# Patient Record
Sex: Female | Born: 1935
Health system: Southern US, Community
[De-identification: ages and names within clinical notes are randomized; demographics above are authoritative.]

## PROBLEM LIST (undated history)

## (undated) DIAGNOSIS — R531 Weakness: Secondary | ICD-10-CM

## (undated) DIAGNOSIS — I10 Essential (primary) hypertension: Secondary | ICD-10-CM

## (undated) DIAGNOSIS — Z923 Personal history of irradiation: Secondary | ICD-10-CM

## (undated) DIAGNOSIS — E079 Disorder of thyroid, unspecified: Secondary | ICD-10-CM

## (undated) DIAGNOSIS — C55 Malignant neoplasm of uterus, part unspecified: Secondary | ICD-10-CM

## (undated) DIAGNOSIS — K5909 Other constipation: Secondary | ICD-10-CM

## (undated) HISTORY — PX: CORONARY ANGIOPLASTY: SHX604

## (undated) HISTORY — DX: Personal history of irradiation: Z92.3

---
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
## 1998-01-21 ENCOUNTER — Emergency Department (HOSPITAL_COMMUNITY): Admission: EM | Admit: 1998-01-21 | Discharge: 1998-01-21 | Payer: Self-pay | Admitting: Emergency Medicine

## 1998-01-25 ENCOUNTER — Encounter: Admission: RE | Admit: 1998-01-25 | Discharge: 1998-01-25 | Payer: Self-pay | Admitting: Internal Medicine

## 1998-02-01 ENCOUNTER — Encounter: Admission: RE | Admit: 1998-02-01 | Discharge: 1998-02-01 | Payer: Self-pay | Admitting: Internal Medicine

## 1998-03-14 ENCOUNTER — Ambulatory Visit (HOSPITAL_COMMUNITY): Admission: RE | Admit: 1998-03-14 | Discharge: 1998-03-14 | Payer: Self-pay | Admitting: Cardiology

## 1998-03-21 ENCOUNTER — Ambulatory Visit (HOSPITAL_COMMUNITY): Admission: RE | Admit: 1998-03-21 | Discharge: 1998-03-22 | Payer: Self-pay | Admitting: Cardiology

## 1998-05-08 ENCOUNTER — Ambulatory Visit (HOSPITAL_COMMUNITY): Admission: RE | Admit: 1998-05-08 | Discharge: 1998-05-08 | Payer: Self-pay | Admitting: Cardiology

## 1998-05-13 ENCOUNTER — Ambulatory Visit (HOSPITAL_COMMUNITY): Admission: RE | Admit: 1998-05-13 | Discharge: 1998-05-13 | Payer: Self-pay | Admitting: Cardiology

## 1998-05-16 ENCOUNTER — Ambulatory Visit (HOSPITAL_COMMUNITY): Admission: RE | Admit: 1998-05-16 | Discharge: 1998-05-16 | Payer: Self-pay | Admitting: Cardiology

## 1999-05-20 DIAGNOSIS — R531 Weakness: Secondary | ICD-10-CM

## 1999-05-20 HISTORY — DX: Weakness: R53.1

## 2000-08-22 ENCOUNTER — Emergency Department (HOSPITAL_COMMUNITY): Admission: EM | Admit: 2000-08-22 | Discharge: 2000-08-22 | Payer: Self-pay | Admitting: Emergency Medicine

## 2001-04-25 ENCOUNTER — Ambulatory Visit (HOSPITAL_COMMUNITY): Admission: RE | Admit: 2001-04-25 | Discharge: 2001-04-25 | Payer: Self-pay | Admitting: Cardiology

## 2001-04-25 ENCOUNTER — Encounter: Payer: Self-pay | Admitting: Cardiology

## 2002-09-29 ENCOUNTER — Ambulatory Visit (HOSPITAL_COMMUNITY): Admission: RE | Admit: 2002-09-29 | Discharge: 2002-09-29 | Payer: Self-pay | Admitting: Cardiology

## 2002-09-29 ENCOUNTER — Encounter: Payer: Self-pay | Admitting: Cardiology

## 2005-11-26 ENCOUNTER — Emergency Department (HOSPITAL_COMMUNITY): Admission: EM | Admit: 2005-11-26 | Discharge: 2005-11-26 | Payer: Self-pay | Admitting: *Deleted

## 2007-04-04 ENCOUNTER — Encounter: Admission: RE | Admit: 2007-04-04 | Discharge: 2007-04-04 | Payer: Self-pay | Admitting: Cardiology

## 2007-05-22 ENCOUNTER — Emergency Department (HOSPITAL_COMMUNITY): Admission: EM | Admit: 2007-05-22 | Discharge: 2007-05-22 | Payer: Self-pay | Admitting: Emergency Medicine

## 2008-02-17 ENCOUNTER — Emergency Department (HOSPITAL_COMMUNITY): Admission: EM | Admit: 2008-02-17 | Discharge: 2008-02-17 | Payer: Self-pay | Admitting: Emergency Medicine

## 2012-06-23 ENCOUNTER — Other Ambulatory Visit: Payer: Self-pay | Admitting: Internal Medicine

## 2012-06-23 DIAGNOSIS — R609 Edema, unspecified: Secondary | ICD-10-CM

## 2012-06-23 DIAGNOSIS — R52 Pain, unspecified: Secondary | ICD-10-CM

## 2012-06-23 DIAGNOSIS — F29 Unspecified psychosis not due to a substance or known physiological condition: Secondary | ICD-10-CM

## 2012-06-27 ENCOUNTER — Ambulatory Visit
Admission: RE | Admit: 2012-06-27 | Discharge: 2012-06-27 | Disposition: A | Discharge status: Home / Self Care | Payer: Medicare Other | Source: Ambulatory Visit | Attending: Internal Medicine | Admitting: Internal Medicine

## 2012-06-27 DIAGNOSIS — R52 Pain, unspecified: Secondary | ICD-10-CM

## 2012-06-27 DIAGNOSIS — F29 Unspecified psychosis not due to a substance or known physiological condition: Secondary | ICD-10-CM

## 2012-06-27 DIAGNOSIS — R609 Edema, unspecified: Secondary | ICD-10-CM

## 2012-07-12 ENCOUNTER — Emergency Department (HOSPITAL_COMMUNITY): Payer: Medicare Other

## 2012-07-12 ENCOUNTER — Emergency Department (INDEPENDENT_AMBULATORY_CARE_PROVIDER_SITE_OTHER)
Admission: EM | Admit: 2012-07-12 | Discharge: 2012-07-12 | Disposition: A | Discharge status: Nursing Home (Includes ICF) | Payer: Medicare Other | Source: Home / Self Care | Attending: Family Medicine | Admitting: Family Medicine

## 2012-07-12 ENCOUNTER — Emergency Department (HOSPITAL_COMMUNITY)
Admission: EM | Admit: 2012-07-12 | Discharge: 2012-07-12 | Disposition: A | Discharge status: Home / Self Care | Payer: Medicare Other | Attending: Emergency Medicine | Admitting: Emergency Medicine

## 2012-07-12 ENCOUNTER — Encounter (HOSPITAL_COMMUNITY): Payer: Self-pay | Admitting: Emergency Medicine

## 2012-07-12 DIAGNOSIS — I440 Atrioventricular block, first degree: Secondary | ICD-10-CM | POA: Insufficient documentation

## 2012-07-12 DIAGNOSIS — R269 Unspecified abnormalities of gait and mobility: Secondary | ICD-10-CM

## 2012-07-12 DIAGNOSIS — R42 Dizziness and giddiness: Secondary | ICD-10-CM

## 2012-07-12 DIAGNOSIS — I4510 Unspecified right bundle-branch block: Secondary | ICD-10-CM | POA: Insufficient documentation

## 2012-07-12 DIAGNOSIS — R2681 Unsteadiness on feet: Secondary | ICD-10-CM

## 2012-07-12 DIAGNOSIS — I10 Essential (primary) hypertension: Secondary | ICD-10-CM | POA: Insufficient documentation

## 2012-07-12 DIAGNOSIS — N390 Urinary tract infection, site not specified: Secondary | ICD-10-CM

## 2012-07-12 HISTORY — DX: Disorder of thyroid, unspecified: E07.9

## 2012-07-12 HISTORY — DX: Essential (primary) hypertension: I10

## 2012-07-12 LAB — COMPREHENSIVE METABOLIC PANEL
ALT: 16 U/L (ref 0–35)
AST: 29 U/L (ref 0–37)
Albumin: 3.9 g/dL (ref 3.5–5.2)
Calcium: 9.9 mg/dL (ref 8.4–10.5)
Chloride: 101 mEq/L (ref 96–112)
Creatinine, Ser: 0.75 mg/dL (ref 0.50–1.10)
Sodium: 141 mEq/L (ref 135–145)

## 2012-07-12 LAB — CBC WITH DIFFERENTIAL/PLATELET
Basophils Absolute: 0 10*3/uL (ref 0.0–0.1)
Basophils Relative: 0 % (ref 0–1)
Eosinophils Relative: 1 % (ref 0–5)
HCT: 37.9 % (ref 36.0–46.0)
Lymphocytes Relative: 19 % (ref 12–46)
MCHC: 34 g/dL (ref 30.0–36.0)
Monocytes Absolute: 0.4 10*3/uL (ref 0.1–1.0)
Neutro Abs: 3.4 10*3/uL (ref 1.7–7.7)
Platelets: 237 10*3/uL (ref 150–400)
RDW: 15 % (ref 11.5–15.5)
WBC: 4.8 10*3/uL (ref 4.0–10.5)

## 2012-07-12 LAB — URINALYSIS, ROUTINE W REFLEX MICROSCOPIC
Glucose, UA: NEGATIVE mg/dL
Ketones, ur: NEGATIVE mg/dL
Protein, ur: NEGATIVE mg/dL
Urobilinogen, UA: 0.2 mg/dL (ref 0.0–1.0)

## 2012-07-12 LAB — URINE MICROSCOPIC-ADD ON

## 2012-07-12 LAB — TROPONIN I
Troponin I: <0.3 ng/mL (ref <0.30)
Troponin I: <0.3 ng/mL (ref <0.30)

## 2012-07-12 MED ORDER — SODIUM CHLORIDE 0.9 % IV SOLN
Freq: Once | INTRAVENOUS | Status: AC
  Administered 2012-07-12: 14:00:00 via INTRAVENOUS

## 2012-07-12 MED ORDER — SULFAMETHOXAZOLE-TRIMETHOPRIM 800-160 MG PO TABS: 1.0000 | ORAL_TABLET | Freq: Two times a day (BID) | ORAL | Status: AC

## 2012-07-12 NOTE — ED Notes (Signed)
[facility] called for transfer 

## 2012-07-12 NOTE — ED Provider Notes (Signed)
History     CSN: [id number]  Arrival date & time 07/12/12  1453   First MD Initiated Contact with Patient 07/12/12 1500      Chief Complaint  Patient presents with  . Dizziness     HPI  The patient presents with concerns of increasing disequilibrium, and after a near fall. The patient has a history of vertigo, that began several months ago.  The patient and her daughter note that over the past days this has become worse.  The patient denies any specific falls, states that she nearly fell just prior to arrival today.  She notes that there are no clear precipitating, exacerbating, alleviating circumstances.  She was prescribed meclizine, though she does not take this medicine regularly. She denies any ongoing chest pain,, dyspnea, lightheadedness, nausea, dysuria, abdominal pain, vomiting.  Past Medical History  Diagnosis Date  . Thyroid disease   . Hypertension     Past Surgical History  Procedure Date  . Coronary angioplasty     No family history on file.  History  Substance Use Topics  . Smoking status: Never Smoker   . Smokeless tobacco: Not on file  . Alcohol Use: No    OB History    Grav Para Term Preterm Abortions TAB SAB Ect Mult Living                  Review of Systems  Constitutional:       HPI  HENT:       HPI otherwise negative  Eyes: Negative.   Respiratory:       HPI, otherwise negative  Cardiovascular:       HPI, otherwise nmegative  Gastrointestinal: Negative for vomiting.  Genitourinary:       HPI, otherwise negative  Musculoskeletal:       HPI, otherwise negative  Skin: Negative.   Neurological: Positive for dizziness. Negative for tremors, seizures, syncope, facial asymmetry, speech difficulty, weakness, light-headedness, numbness and headaches.    Allergies  Review of patient's allergies indicates no known allergies.  Home Medications   Current Outpatient Rx  Name Route Sig Dispense Refill  . B COMPLEX PO TABS Oral Take 1  tablet by mouth daily.    Marland Kitchen DIAZEPAM 2 MG PO TABS Oral Take 2 mg by mouth at bedtime as needed. For insomnia.    Marland Kitchen LEVOTHYROXINE SODIUM 112 MCG PO TABS Oral Take 112 mcg by mouth daily.    Marland Kitchen LOSARTAN POTASSIUM-HCTZ 100-25 MG PO TABS Oral Take 1 tablet by mouth daily.    Marland Kitchen MECLIZINE HCL 25 MG PO TABS Oral Take 25 mg by mouth 3 (three) times daily as needed. For vertigo.    Marland Kitchen ONE-DAILY MULTI VITAMINS PO TABS Oral Take 1 tablet by mouth daily.    . OMEGA-3-ACID ETHYL ESTERS 1 G PO CAPS Oral Take 2 g by mouth daily.    Marland Kitchen OVER THE COUNTER MEDICATION Oral Take 1 tablet by mouth daily as needed. All-natural laxative.  For constipation.    Marland Kitchen PROBIOTIC PO Oral Take 1 tablet by mouth daily. [employer] brand generic.      BP 174/75  Temp 97.8 F (36.6 C) (Oral)  Resp 12  SpO2 100%  Physical Exam  Nursing note and vitals reviewed. Constitutional: She is oriented to person, place, and time. She appears well-developed and well-nourished. No distress.       Elderly female resting comfortably in bed  HENT:  Head: Normocephalic and atraumatic.  Eyes: Conjunctivae normal and  EOM are normal.  Cardiovascular: Normal rate and regular rhythm.   Murmur heard. Pulmonary/Chest: Effort normal and breath sounds normal. No stridor. No respiratory distress.  Abdominal: She exhibits no distension.  Musculoskeletal: She exhibits no edema.  Neurological: She is alert and oriented to person, place, and time. She displays no atrophy and no tremor. No cranial nerve deficit or sensory deficit. She exhibits normal muscle tone. She displays no seizure activity. Coordination normal.       Is no gross cerebellar deficits.  Strength is appropriate and symmetric throughout.  No nystagmus   Skin: Skin is warm and dry.  Psychiatric: She has a normal mood and affect.    ED Course  Procedures (including critical care time)   Labs Reviewed  URINALYSIS, ROUTINE W REFLEX MICROSCOPIC  CBC WITH DIFFERENTIAL  COMPREHENSIVE  METABOLIC PANEL  LACTIC ACID, PLASMA  TROPONIN I  TROPONIN I  TROPONIN I   No results found.   No diagnosis found.  Cardiac 60 sinus rhythm normal Pulse ox 100% room air normal   Date: 07/12/2012  Rate: 62  Rhythm: normal sinus rhythm  QRS Axis: left  Intervals: PR prolonged  ST/T Wave abnormalities: nonspecific ST/T changes  Conduction Disutrbances:first-degree A-V block RBBB  Narrative Interpretation:   Old EKG Reviewed: none available ABNORMAL  I obtain the patient's MRI results from earlier this month.  No acute findings at the time.  There are multiple age associated changes. MDM  This pleasant elderly female presents with concerns of ongoing disequilibrium and dizziness.  Notably the patient previously diagnosed with disequilibrium, likely vertigo and prescribed meclizine which is not taking reliably.  On my exam the patient is in no distress with no focal neurologic deficiencies.  Given the patient's history there suspicion of ongoing subacute process.  I obtained outside records of recent MRI, these were largely reassuring.  The patient's labs are notable for largely unremarkable results, but there is evidence of a urinary tract infection.  Urine culture was sent and this was started on antibiotics.  She was discharged in stable condition with next a followup.  She was also advised on the requirement for appropriate medication compliance.   Gerhard Munch, MD 07/12/12 2240
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
## 2012-07-12 NOTE — ED Notes (Signed)
[facility] transfer via [facility], pt states she was dizzy at home this AM, denies dizzy on arrival, is A/O, equal grips, clear speech, no CP/SOB, denies all pain, NAD

## 2012-07-12 NOTE — ED Notes (Signed)
Pt's family st's they're ready to go, st's they have to get home to care for family.  Will notify EDP [place].

## 2012-07-12 NOTE — ED Notes (Signed)
Triage delay to nursing staff in a procedure

## 2012-07-12 NOTE — ED Notes (Signed)
Pt assessed in waiting room - appears in no acute distress pt a & o x 3 - states " i know what it is - i don't drink enough fluids and that makes my dizzy spells worse." denies syncope.

## 2012-07-12 NOTE — ED Provider Notes (Signed)
History     CSN: [id number]  Arrival date & time 07/12/12  1228   First MD Initiated Contact with Patient 07/12/12 1300      Chief Complaint  Patient presents with  . Dizziness    (Consider location/radiation/quality/duration/timing/severity/associated sxs/prior treatment) Patient is a 78 y.o. female presenting with fall. The history is provided by the patient and a relative.  Fall The accident occurred 3 to 5 hours ago (unsteadiness while in kitchen, signif different from recent problem with dizziness, feeling off balance and weak.). Pertinent negatives include no numbness and no headaches.    Past Medical History  Diagnosis Date  . Thyroid disease   . Hypertension     Past Surgical History  Procedure Date  . Coronary angioplasty     No family history on file.  History  Substance Use Topics  . Smoking status: Never Smoker   . Smokeless tobacco: Not on file  . Alcohol Use: No    OB History    Grav Para Term Preterm Abortions TAB SAB Ect Mult Living                  Review of Systems  Respiratory: Negative.   Cardiovascular: Negative.   Gastrointestinal: Negative.   Genitourinary: Negative.   Musculoskeletal: Positive for gait problem. Negative for back pain.  Neurological: Positive for dizziness and weakness. Negative for seizures, syncope, speech difficulty, numbness and headaches.    Allergies  Review of patient's allergies indicates no known allergies.  Home Medications   Current Outpatient Rx  Name Route Sig Dispense Refill  . B-COMPLEX PO Oral Take by mouth.    . OMEGA-3 FATTY ACIDS 1000 MG PO CAPS Oral Take 2 g by mouth daily.    Marland Kitchen LEVOTHYROXINE SODIUM 112 MCG PO TABS Oral Take 112 mcg by mouth daily.    Marland Kitchen LOSARTAN POTASSIUM-HCTZ 100-25 MG PO TABS Oral Take 1 tablet by mouth daily.    Marland Kitchen MECLIZINE HCL 25 MG PO TABS Oral Take 25 mg by mouth 3 (three) times daily as needed.    Marland Kitchen ONE-DAILY MULTI VITAMINS PO TABS Oral Take 1 tablet by mouth daily.       BP 200/102  Pulse 65  Temp 98.7 F (37.1 C) (Oral)  Resp 20  SpO2 98%  Physical Exam  Nursing note and vitals reviewed. Constitutional: She appears well-developed and well-nourished. No distress.  HENT:  Head: Normocephalic.  Eyes: Pupils are equal, round, and reactive to light.  Neck: Normal range of motion. Neck supple.  Cardiovascular: Normal rate and normal heart sounds.   Pulmonary/Chest: Breath sounds normal.  Abdominal: Bowel sounds are normal. There is no tenderness.  Lymphadenopathy:    She has no cervical adenopathy.  Neurological: She is alert.  Skin: Skin is warm and dry.    ED Course  Procedures (including critical care time)  Labs Reviewed - No data to display No results found.   1. Unsteady gait       MDM  ecg- nsr. rbbb.       Linna Hoff, MD 07/12/12 1409

## 2012-07-12 NOTE — ED Notes (Signed)
Report to [facility].  

## 2012-12-29 ENCOUNTER — Emergency Department (HOSPITAL_COMMUNITY)
Admission: EM | Admit: 2012-12-29 | Discharge: 2012-12-30 | Disposition: A | Discharge status: Home / Self Care | Payer: 59 | Attending: Emergency Medicine | Admitting: Emergency Medicine

## 2012-12-29 ENCOUNTER — Encounter (HOSPITAL_COMMUNITY): Payer: Self-pay | Admitting: *Deleted

## 2012-12-29 DIAGNOSIS — K5900 Constipation, unspecified: Secondary | ICD-10-CM | POA: Insufficient documentation

## 2012-12-29 DIAGNOSIS — E079 Disorder of thyroid, unspecified: Secondary | ICD-10-CM | POA: Insufficient documentation

## 2012-12-29 DIAGNOSIS — K644 Residual hemorrhoidal skin tags: Secondary | ICD-10-CM | POA: Insufficient documentation

## 2012-12-29 DIAGNOSIS — Z79899 Other long term (current) drug therapy: Secondary | ICD-10-CM | POA: Insufficient documentation

## 2012-12-29 DIAGNOSIS — I10 Essential (primary) hypertension: Secondary | ICD-10-CM | POA: Insufficient documentation

## 2012-12-29 NOTE — ED Notes (Signed)
Pt unable to have bowel movement; last normal bowel movement unknown; denies abd pain

## 2012-12-30 ENCOUNTER — Emergency Department (HOSPITAL_COMMUNITY): Payer: 59

## 2012-12-30 LAB — CBC WITH DIFFERENTIAL/PLATELET
Eosinophils Absolute: 0 10*3/uL (ref 0.0–0.7)
Hemoglobin: 11.4 g/dL — ABNORMAL LOW (ref 12.0–15.0)
Lymphocytes Relative: 13 % (ref 12–46)
Lymphs Abs: 0.9 10*3/uL (ref 0.7–4.0)
MCH: 29.2 pg (ref 26.0–34.0)
Monocytes Relative: 5 % (ref 3–12)
Neutrophils Relative %: 82 % — ABNORMAL HIGH (ref 43–77)
Platelets: 279 10*3/uL (ref 150–400)
RBC: 3.9 MIL/uL (ref 3.87–5.11)
WBC: 7.3 10*3/uL (ref 4.0–10.5)

## 2012-12-30 LAB — POCT I-STAT, CHEM 8
BUN: 13 mg/dL (ref 6–23)
Chloride: 106 mEq/L (ref 96–112)
Creatinine, Ser: 0.9 mg/dL (ref 0.50–1.10)
Glucose, Bld: 113 mg/dL — ABNORMAL HIGH (ref 70–99)
Potassium: 3.9 mEq/L (ref 3.5–5.1)
Sodium: 142 mEq/L (ref 135–145)

## 2012-12-30 MED ORDER — POLYETHYLENE GLYCOL 3350 17 GM/SCOOP PO POWD: 17.0000 g | Freq: Every day | ORAL | Status: DC

## 2012-12-30 MED ORDER — HYDROCORTISONE 1 % EX CREA: TOPICAL_CREAM | CUTANEOUS | Status: DC

## 2012-12-30 NOTE — ED Notes (Signed)
Pt is complaining of a headache

## 2012-12-30 NOTE — ED Provider Notes (Signed)
History     CSN: [id number]  Arrival date & time 12/29/12  2118   First MD Initiated Contact with Patient 12/30/12 0007      No chief complaint on file.   (Consider location/radiation/quality/duration/timing/severity/associated sxs/prior treatment) Patient is a 79 y.o. female presenting with constipation. The history is provided by the patient and a relative.  Constipation  The current episode started yesterday. The onset was gradual. The problem occurs continuously. The problem has been rapidly improving. The patient is experiencing no pain. The stool is described as hard. Treatments tried: mag citrate. There was no prior unsuccessful therapy. Associated symptoms include hemorrhoids. Pertinent negatives include no fever, no abdominal pain, no diarrhea, no nausea, no vomiting and no difficulty breathing. She has been behaving normally. She has been eating and drinking normally. Her past medical history does not include recent abdominal injury. Recently, medical care has been given by the PCP.  Was withholding stool secondary to hemorrhoids and family and patient wanted to be checked out.  Had large stool in ED>    Past Medical History  Diagnosis Date  . Thyroid disease   . Hypertension     Past Surgical History  Procedure Laterality Date  . Coronary angioplasty      No family history on file.  History  Substance Use Topics  . Smoking status: Never Smoker   . Smokeless tobacco: Not on file  . Alcohol Use: No    OB History   Grav Para Term Preterm Abortions TAB SAB Ect Mult Living                  Review of Systems  Constitutional: Negative for fever.  Gastrointestinal: Positive for constipation and hemorrhoids. Negative for nausea, vomiting, abdominal pain, diarrhea and blood in stool.  All other systems reviewed and are negative.    Allergies  Review of patient's allergies indicates no known allergies.  Home Medications   Current Outpatient Rx  Name  Route   Sig  Dispense  Refill  . diazepam (VALIUM) 2 MG tablet   Oral   Take 2 mg by mouth at bedtime as needed. For insomnia.         . Ensure Plus (ENSURE PLUS) LIQD   Oral   Take 237 mLs by mouth 2 (two) times daily between meals.         Marland Kitchen levothyroxine (SYNTHROID, LEVOTHROID) 112 MCG tablet   Oral   Take 112 mcg by mouth daily.         Marland Kitchen losartan-hydrochlorothiazide (HYZAAR) 100-25 MG per tablet   Oral   Take 1 tablet by mouth daily.         . meclizine (ANTIVERT) 25 MG tablet   Oral   Take 25 mg by mouth 3 (three) times daily as needed. For vertigo.         Marland Kitchen OVER THE COUNTER MEDICATION   Oral   Take 1 tablet by mouth daily as needed. All-natural laxative.  For constipation.           BP 193/102  Pulse 83  Temp(Src) 98.1 F (36.7 C) (Oral)  Resp 20  Wt 150 lb (68.04 kg)  SpO2 100%  Physical Exam  Constitutional: She is oriented to person, place, and time. She appears well-developed and well-nourished. No distress.  HENT:  Head: Normocephalic and atraumatic.  Mouth/Throat: Oropharynx is clear and moist.  Eyes: Conjunctivae are normal. Pupils are equal, round, and reactive to light.  Neck: Normal range of  motion. Neck supple.  Cardiovascular: Normal rate, regular rhythm and intact distal pulses.   Pulmonary/Chest: Effort normal and breath sounds normal. She has no wheezes. She has no rales.  Abdominal: Soft. Bowel sounds are normal. There is no tenderness. There is no rebound and no guarding.  Genitourinary:  External hemorrhoids not thrombosed.    Musculoskeletal: Normal range of motion.  Neurological: She is alert and oriented to person, place, and time.  Skin: Skin is warm and dry.  Psychiatric: She has a normal mood and affect.    ED Course  Procedures (including critical care time)  Labs Reviewed  CBC WITH DIFFERENTIAL   No results found.   No diagnosis found.    MDM  Constipation and hemorrhoids.  Abdominal exam labs and vitals  reassuring.  No indication for advanced imaging at this time.  Will prescribe miralax for ongoing stool softening and proctosol for hemorrhoids.  Non thrombosed no indication for I/D at this time.  For ongoing symptoms please see [facility].  Return for fevers,abdominal pain rectal bleeding inability to pass stool vomiting or any concerns.  Patient and daughter verbalize understanding and agree to follow up        Reva Pinkley Smitty Cords, MD 12/30/12 [phone]

## 2013-08-30 ENCOUNTER — Ambulatory Visit (INDEPENDENT_AMBULATORY_CARE_PROVIDER_SITE_OTHER): Payer: Medicare Other | Admitting: Podiatry

## 2013-08-30 ENCOUNTER — Encounter: Payer: Self-pay | Admitting: Podiatry

## 2013-08-30 VITALS — BP 140/92 | HR 78 | Resp 16 | Ht 63.0 in | Wt 145.0 lb

## 2013-08-30 DIAGNOSIS — M79609 Pain in unspecified limb: Secondary | ICD-10-CM

## 2013-08-30 DIAGNOSIS — B351 Tinea unguium: Secondary | ICD-10-CM

## 2013-08-30 NOTE — Progress Notes (Signed)
[name] presents today with a chief complaint of painful toenails bilateral.  Objective: Vital signs are stable she is alert and oriented x3 pulses are palpable bilateral. Nails are thick yellow dystrophic clinically mycotic and sharply incurvated.  Assessment: Pain in limb secondary to onychomycosis 1 through 5 bilateral.  Plan: Debridement of nails thickness and length as a covered service secondary to pain. Followup with her in 3 months.

## 2013-09-26 ENCOUNTER — Encounter (HOSPITAL_COMMUNITY): Payer: Self-pay | Admitting: Emergency Medicine

## 2013-09-26 ENCOUNTER — Other Ambulatory Visit: Payer: Self-pay

## 2013-09-26 ENCOUNTER — Emergency Department (HOSPITAL_COMMUNITY): Payer: Medicare Other

## 2013-09-26 ENCOUNTER — Observation Stay (HOSPITAL_COMMUNITY)
Admission: EM | Admit: 2013-09-26 | Discharge: 2013-09-28 | Disposition: A | Discharge status: Home / Self Care | Payer: Medicare Other | Attending: Internal Medicine | Admitting: Internal Medicine

## 2013-09-26 DIAGNOSIS — E079 Disorder of thyroid, unspecified: Secondary | ICD-10-CM | POA: Insufficient documentation

## 2013-09-26 DIAGNOSIS — F329 Major depressive disorder, single episode, unspecified: Secondary | ICD-10-CM | POA: Diagnosis present

## 2013-09-26 DIAGNOSIS — R55 Syncope and collapse: Secondary | ICD-10-CM | POA: Diagnosis present

## 2013-09-26 DIAGNOSIS — I10 Essential (primary) hypertension: Principal | ICD-10-CM | POA: Insufficient documentation

## 2013-09-26 DIAGNOSIS — E039 Hypothyroidism, unspecified: Secondary | ICD-10-CM | POA: Diagnosis present

## 2013-09-26 DIAGNOSIS — R4182 Altered mental status, unspecified: Secondary | ICD-10-CM | POA: Diagnosis present

## 2013-09-26 LAB — CBC WITH DIFFERENTIAL/PLATELET
Basophils Relative: 1 % (ref 0–1)
Eosinophils Absolute: 0 10*3/uL (ref 0.0–0.7)
Eosinophils Relative: 1 % (ref 0–5)
HCT: 34.1 % — ABNORMAL LOW (ref 36.0–46.0)
Hemoglobin: 11.5 g/dL — ABNORMAL LOW (ref 12.0–15.0)
Lymphs Abs: 0.6 10*3/uL — ABNORMAL LOW (ref 0.7–4.0)
MCH: 29.6 pg (ref 26.0–34.0)
MCHC: 33.7 g/dL (ref 30.0–36.0)
MCV: 87.7 fL (ref 78.0–100.0)
Monocytes Absolute: 0.4 10*3/uL (ref 0.1–1.0)
Monocytes Relative: 12 % (ref 3–12)
Neutrophils Relative %: 70 % (ref 43–77)
RBC: 3.89 MIL/uL (ref 3.87–5.11)

## 2013-09-26 LAB — COMPREHENSIVE METABOLIC PANEL
Alkaline Phosphatase: 55 U/L (ref 39–117)
BUN: 24 mg/dL — ABNORMAL HIGH (ref 6–23)
Creatinine, Ser: 1.01 mg/dL (ref 0.50–1.10)
GFR calc Af Amer: 53 mL/min — ABNORMAL LOW (ref >90)
Glucose, Bld: 119 mg/dL — ABNORMAL HIGH (ref 70–99)
Potassium: 3.6 mEq/L (ref 3.5–5.1)
Total Protein: 7.1 g/dL (ref 6.0–8.3)

## 2013-09-26 LAB — POCT I-STAT TROPONIN I: Troponin i, poc: 0.01 ng/mL (ref 0.00–0.08)

## 2013-09-26 MED ORDER — SODIUM CHLORIDE 0.9 % IV BOLUS (SEPSIS)
500.0000 mL | Freq: Once | INTRAVENOUS | Status: AC
  Administered 2013-09-26: 500 mL via INTRAVENOUS
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
## 2013-09-26 NOTE — ED Notes (Signed)
Bed: EA54 Expected date: 09/26/13 Expected time: 7:34 PM Means of arrival: Ambulance Comments: 79 yo F   syncopal

## 2013-09-26 NOTE — ED Provider Notes (Signed)
CSN: [id number]     Arrival date & time 09/26/13  2004 History   First MD Initiated Contact with Patient 09/26/13 2004     Chief Complaint  Patient presents with  . Loss of Consciousness   (Consider location/radiation/quality/duration/timing/severity/associated sxs/prior Treatment) HPI  This is a 79 are old female with a history of hypertension and thyroid disease who presents with syncope.  On initial evaluation, the patient states "I feel fine." Per the patient's daughter, they were eating dinner earlier this evening when her mother stated that she "felt tired." She subsequently passed out and had to be held in her chair. She appear to be unresponsive. There is no shaking movements. Patient's daughter called 911 and they placed the patient on the floor. When she woke up, she was alert and oriented. Patient's daughter does state that the patient has had a cough for the last week that appears to have gotten worse. She didn't feel well over the weekend. The patient's daughter states that prior to having the syncopal event, the patient's speech was "gibberish." She was also incontinent.  Patient currently has no complaints. Past Medical History  Diagnosis Date  . Thyroid disease   . Hypertension    Past Surgical History  Procedure Laterality Date  . Coronary angioplasty     No family history on file. History  Substance Use Topics  . Smoking status: Never Smoker   . Smokeless tobacco: Not on file  . Alcohol Use: No   OB History   Grav Para Term Preterm Abortions TAB SAB Ect Mult Living                 Review of Systems  Constitutional: Negative for fever.  Respiratory: Positive for cough. Negative for chest tightness and shortness of breath.   Cardiovascular: Negative for chest pain.  Gastrointestinal: Negative for nausea, vomiting and abdominal pain.  Genitourinary: Negative for dysuria.  Skin: Negative for wound.  Neurological: Positive for syncope. Negative for headaches.   Psychiatric/Behavioral: Negative for confusion.  All other systems reviewed and are negative.    Allergies  Review of patient's allergies indicates no known allergies.  Home Medications   Current Outpatient Rx  Name  Route  Sig  Dispense  Refill  . citalopram (CELEXA) 10 MG tablet   Oral   Take 10 mg by mouth daily.          . diazepam (VALIUM) 2 MG tablet   Oral   Take 2 mg by mouth at bedtime as needed. For insomnia.         . Emollient (CERAVE PM) LOTN   Apply externally   Apply 1 application topically 2 (two) times daily.         Marland Kitchen ENSURE (ENSURE)   Oral   Take 237 mLs by mouth 2 (two) times daily between meals.         . hydrocortisone cream 1 %      Apply to affected area 2 times daily   15 g   0   . hydrocortisone-pramoxine (PRAMOSONE) 1-2.5 % LOTN   Topical   Apply 1 application topically 2 (two) times daily. Take twice daily for two weeks then PM only for two weeks then stop         . levothyroxine (SYNTHROID, LEVOTHROID) 150 MCG tablet   Oral   Take 150 mcg by mouth daily before breakfast.         . losartan-hydrochlorothiazide (HYZAAR) 100-25 MG per tablet  Oral   Take 1 tablet by mouth daily.         . meclizine (ANTIVERT) 25 MG tablet   Oral   Take 25 mg by mouth 3 (three) times daily as needed. For vertigo.          BP 139/70  Pulse 58  Temp(Src) 98.9 F (37.2 C) (Oral)  Resp 18  SpO2 100% Physical Exam  Nursing note and vitals reviewed. Constitutional: She is oriented to person, place, and time. She appears well-nourished. No distress.  Elderly, no acute distress  HENT:  Head: Normocephalic and atraumatic.  Mouth/Throat: Oropharynx is clear and moist.  Eyes: EOM are normal. Pupils are equal, round, and reactive to light.  Neck: Neck supple.  Cardiovascular: Normal rate, regular rhythm and normal heart sounds.   No murmur heard. Pulmonary/Chest: Effort normal and breath sounds normal. No respiratory distress. She has  no wheezes.  Abdominal: Soft. There is no tenderness.  Musculoskeletal: She exhibits no edema.  Neurological: She is alert and oriented to person, place, and time. No cranial nerve deficit. Coordination normal.  5 out of 5 strength in all 4 extremities, no clonus, no dysmetria to finger-nose-finger  Skin: Skin is warm and dry.  Psychiatric: She has a normal mood and affect.    ED Course  Procedures (including critical care time) Labs Review Labs Reviewed  CBC WITH DIFFERENTIAL - Abnormal; Notable for the following:    WBC 3.4 (*)    Hemoglobin 11.5 (*)    HCT 34.1 (*)    Lymphs Abs 0.6 (*)    All other components within normal limits  COMPREHENSIVE METABOLIC PANEL - Abnormal; Notable for the following:    Sodium 132 (*)    Chloride 95 (*)    Glucose, Bld 119 (*)    BUN 24 (*)    Albumin 3.4 (*)    GFR calc non Af Amer 46 (*)    GFR calc Af Amer 53 (*)    All other components within normal limits  TSH  POCT I-STAT TROPONIN I   Imaging Review Dg Chest 2 View  09/26/2013   CLINICAL DATA:  79 year old female with loss of consciousness. Initial encounter. Hypertension.  EXAM: CHEST  2 VIEW  COMPARISON:  12/30/2012 and earlier.  FINDINGS: Mildly lower lung volumes. Stable cardiomegaly and mediastinal contours. Chronic ectasia and calcified atherosclerosis of the thoracic aorta. No pneumothorax, pulmonary edema, pleural effusion or confluent pulmonary opacity. No acute osseous abnormality identified.  IMPRESSION: Stable cardiomegaly. No acute cardiopulmonary abnormality.   Electronically Signed   By: Augusto Gamble M.D.   On: 09/26/2013 21:09   Ct Head Wo Contrast  09/26/2013   CLINICAL DATA:  Syncope; loss of consciousness.  EXAM: CT HEAD WITHOUT CONTRAST  TECHNIQUE: Contiguous axial images were obtained from the base of the skull through the vertex without intravenous contrast.  COMPARISON:  CT of the head performed 07/12/2012  FINDINGS: There is no evidence of acute infarction, mass  lesion, or intra- or extra-axial hemorrhage on CT.  Prominence of the sulci suggests mild cortical volume loss. Scattered periventricular white matter change likely reflects small vessel ischemic microangiopathy. Mild cerebellar atrophy is noted.  The brainstem and fourth ventricle are within normal limits. The basal ganglia are unremarkable in appearance. The cerebral hemispheres demonstrate grossly normal gray-white differentiation. No mass effect or midline shift is seen.  There is no evidence of fracture; visualized osseous structures are unremarkable in appearance. The visualized portions of the orbits are within  normal limits. The paranasal sinuses and mastoid air cells are well-aerated. No significant soft tissue abnormalities are seen.  IMPRESSION: 1. No acute intracranial pathology seen on CT. 2. Mild age-appropriate cortical volume loss and scattered small vessel ischemic microangiopathy.   Electronically Signed   By: Roanna Raider M.D.   On: 09/26/2013 21:21    EKG Interpretation   None      EKG independently reviewed by myself: Normal sinus rhythm with a rate of 66, right bundle branch block which is unchanged from prior MDM   1. Syncope    Patient presents with acute loss of consciousness. She is nontoxic-appearing on exam and has no complaints.  Vital signs are within normal limits. Lab work was initiated. Orthostatics showed increase in heart rate upon standing the patient is not technically orthostatic. Patient was given IV fluids.  Lab workup is largely unremarkable including troponin. TSH is pending. CT scan of the head is negative for acute abnormality and chest x-ray is negative for infiltrate or pneumonia. Patient has remained is asymptomatic here on the cardiac monitor. Concern for cardiac etiology of syncope given no prodrome and non-positional component. However, neurologic considerations including seizure vs [name] are also in the differential given patient's garbled speech prior  to episode. She is neurologically intact at this time. I have asked neurology to evaluate the patient for further recommendations on imaging. I have asked Dr. Conley Rolls to evaluate the patient for admission.    Shon Baton, MD 09/26/13 [phone]

## 2013-09-26 NOTE — ED Notes (Signed)
Hospitalist at bedside at this time 

## 2013-09-26 NOTE — ED Notes (Signed)
Bed: WA17 Expected date: 09/26/13 Expected time: 7:43 PM Means of arrival: Ambulance Comments: 79 yo F  Medical clearance

## 2013-09-27 ENCOUNTER — Inpatient Hospital Stay (HOSPITAL_COMMUNITY): Payer: Medicare Other

## 2013-09-27 DIAGNOSIS — E039 Hypothyroidism, unspecified: Secondary | ICD-10-CM

## 2013-09-27 LAB — CBC
Hemoglobin: 11 g/dL — ABNORMAL LOW (ref 12.0–15.0)
Platelets: 166 10*3/uL (ref 150–400)
RBC: 3.71 MIL/uL — ABNORMAL LOW (ref 3.87–5.11)
WBC: 2.7 10*3/uL — ABNORMAL LOW (ref 4.0–10.5)

## 2013-09-27 LAB — TROPONIN I
Troponin I: <0.3 ng/mL (ref <0.30)
Troponin I: <0.3 ng/mL (ref <0.30)
Troponin I: <0.3 ng/mL (ref <0.30)

## 2013-09-27 LAB — RAPID URINE DRUG SCREEN, HOSP PERFORMED
Amphetamines: NOT DETECTED
Tetrahydrocannabinol: NOT DETECTED

## 2013-09-27 LAB — GLUCOSE, CAPILLARY: Glucose-Capillary: 97 mg/dL (ref 70–99)

## 2013-09-27 LAB — LIPID PANEL
Cholesterol: 171 mg/dL (ref 0–200)
HDL: 56 mg/dL (ref >39)
Total CHOL/HDL Ratio: 3.1 RATIO
Triglycerides: 67 mg/dL (ref <150)

## 2013-09-27 LAB — TSH
TSH: 1.248 u[IU]/mL (ref 0.350–4.500)
TSH: 0.579 u[IU]/mL (ref 0.350–4.500)

## 2013-09-27 LAB — HEMOGLOBIN A1C
Hgb A1c MFr Bld: 6 % — ABNORMAL HIGH (ref <5.7)
Mean Plasma Glucose: 126 mg/dL — ABNORMAL HIGH (ref <117)

## 2013-09-27 LAB — CREATININE, SERUM
Creatinine, Ser: 0.84 mg/dL (ref 0.50–1.10)
GFR calc non Af Amer: 58 mL/min — ABNORMAL LOW (ref >90)

## 2013-09-27 MED ORDER — LEVOTHYROXINE SODIUM 150 MCG PO TABS
150.0000 ug | ORAL_TABLET | Freq: Every day | ORAL | Status: DC
  Administered 2013-09-27 – 2013-09-28 (×2): 150 ug via ORAL
  Filled 2013-09-27 (×3): qty 1

## 2013-09-27 MED ORDER — CITALOPRAM HYDROBROMIDE 10 MG PO TABS
10.0000 mg | ORAL_TABLET | Freq: Every day | ORAL | Status: DC
  Administered 2013-09-28: 10 mg via ORAL
  Filled 2013-09-27 (×2): qty 1

## 2013-09-27 MED ORDER — HEPARIN SODIUM (PORCINE) 5000 UNIT/ML IJ SOLN
5000.0000 [IU] | Freq: Three times a day (TID) | INTRAMUSCULAR | Status: DC
  Administered 2013-09-27 – 2013-09-28 (×4): 5000 [IU] via SUBCUTANEOUS
  Filled 2013-09-27 (×7): qty 1

## 2013-09-27 MED ORDER — BOOST PLUS PO LIQD
237.0000 mL | Freq: Two times a day (BID) | ORAL | Status: DC
  Filled 2013-09-27 (×6): qty 237

## 2013-09-27 MED ORDER — ASPIRIN 325 MG PO TABS
325.0000 mg | ORAL_TABLET | Freq: Every day | ORAL | Status: DC
  Administered 2013-09-27 – 2013-09-28 (×2): 325 mg via ORAL
  Filled 2013-09-27 (×2): qty 1

## 2013-09-27 MED ORDER — POLYETHYLENE GLYCOL 3350 17 G PO PACK
17.0000 g | PACK | Freq: Every day | ORAL | Status: DC | PRN
  Filled 2013-09-27: qty 1

## 2013-09-27 MED ORDER — GUAIFENESIN ER 600 MG PO TB12
600.0000 mg | ORAL_TABLET | Freq: Two times a day (BID) | ORAL | Status: DC | PRN
  Administered 2013-09-28: 600 mg via ORAL
  Filled 2013-09-27: qty 1

## 2013-09-27 MED ORDER — SODIUM CHLORIDE 0.9 % IV SOLN
INTRAVENOUS | Status: DC
  Administered 2013-09-27: 01:00:00 via INTRAVENOUS

## 2013-09-27 MED ORDER — CETAPHIL MOISTURIZING EX LOTN
1.0000 "application " | TOPICAL_LOTION | Freq: Two times a day (BID) | CUTANEOUS | Status: DC
  Administered 2013-09-27 – 2013-09-28 (×3): 1 via TOPICAL
  Filled 2013-09-27 (×2): qty 118

## 2013-09-27 MED ORDER — POLYETHYLENE GLYCOL 3350 17 G PO PACK: 17.0000 g | PACK | Freq: Every day | ORAL | Status: DC

## 2013-09-27 NOTE — Progress Notes (Signed)
Patient alert, mildly confuse. No further syncope episodes. Speech clear.  1-AMS; ? Syncope, [name]. MRI pending. ECHO, carotid doppler pending. Neuro following.  2-Hypothyroidism: continue with synthroid.   Disposition: depending on test results.

## 2013-09-27 NOTE — Consult Note (Signed)
Referring Physician: York Spaniel     Chief Complaint: Syncope  HPI: Wendy [name] is an 79 y.o. female who lives at home with her daughter.  At baseline patient is ambulatory without a device, takes care of her own ADL's and cooks at times.  Has been feeling poorly for the past few days with upper respiratory symptoms but was out of bed eating dinner with her daughter on 12/9.  Daughter noticed acutely that the patient was stammering and unable to get her words out.  She then became unable to speak at all.  Her eyes then closed and she started to lean to the left as if she was going to fall out of the chair and a left facial droop was noted.  The patient was at that time unresponsive.  EMS was called and the patient was eased to the floor.  She was incontinent of bowel and bladder.  Within a few minutes patient became responsive. She is amnestic of the event.   The patient has had syncopal events in the remote past.  Were felt to be cardiac in the past.   Patient has been having memory problems for the past year.  Work up was unremarkable.    Date last known well: Date: 09/26/2013 Time last known well: Time: 17:30 tPA Given: No: Symptoms improved  Past Medical History  Diagnosis Date  . Thyroid disease   . Hypertension     Past Surgical History  Procedure Laterality Date  . Coronary angioplasty      Family history of CAD, HTN and renal failure.  Social History:  reports that she has never smoked. She does not have any smokeless tobacco history on file. She reports that she does not drink alcohol or use illicit drugs.  Allergies: No Known Allergies  Medications:  I have reviewed the patient's current medications. Prior to Admission:  Prescriptions prior to admission  Medication Sig Dispense Refill  . citalopram (CELEXA) 10 MG tablet Take 10 mg by mouth daily.       . diazepam (VALIUM) 2 MG tablet Take 2 mg by mouth at bedtime as needed. For insomnia.      . Emollient (CERAVE PM) LOTN  Apply 1 application topically 2 (two) times daily.      Marland Kitchen ENSURE (ENSURE) Take 237 mLs by mouth 2 (two) times daily between meals.      . hydrocortisone cream 1 % Apply to affected area 2 times daily  15 g  0  . hydrocortisone-pramoxine (PRAMOSONE) 1-2.5 % LOTN Apply 1 application topically 2 (two) times daily. Take twice daily for two weeks then PM only for two weeks then stop      . levothyroxine (SYNTHROID, LEVOTHROID) 150 MCG tablet Take 150 mcg by mouth daily before breakfast.      . losartan-hydrochlorothiazide (HYZAAR) 100-25 MG per tablet Take 1 tablet by mouth daily.      . meclizine (ANTIVERT) 25 MG tablet Take 25 mg by mouth 3 (three) times daily as needed. For vertigo.       Scheduled: . aspirin  325 mg Oral Daily  . cetaphil  1 application Topical BID  . citalopram  10 mg Oral Daily  . heparin  5,000 Units Subcutaneous Q8H  . lactose free nutrition  237 mL Oral BID BM  . levothyroxine  150 mcg Oral QAC breakfast    ROS: History obtained from the patient  General ROS: negative for - chills, fatigue, fever, night sweats, weight gain or weight  loss Psychological ROS: negative for - behavioral disorder, hallucinations, memory difficulties, mood swings or suicidal ideation Ophthalmic ROS: negative for - blurry vision, double vision, eye pain or loss of vision ENT ROS: negative for - epistaxis, nasal discharge, oral lesions, sore throat, tinnitus or vertigo Allergy and Immunology ROS: negative for - hives or itchy/watery eyes Hematological and Lymphatic ROS: negative for - bleeding problems, bruising or swollen lymph nodes Endocrine ROS: negative for - galactorrhea, hair pattern changes, polydipsia/polyuria or temperature intolerance Respiratory ROS: recent cough Cardiovascular ROS: negative for - chest pain, dyspnea on exertion, edema or irregular heartbeat Gastrointestinal ROS: negative for - abdominal pain, diarrhea, hematemesis, nausea/vomiting or stool  incontinence Genito-Urinary ROS: negative for - dysuria, hematuria, incontinence or urinary frequency/urgency Musculoskeletal ROS: negative for - joint swelling or muscular weakness Neurological ROS: as noted in HPI Dermatological ROS: negative for rash and skin lesion changes  Physical Examination: Blood pressure 181/73, pulse 63, temperature 98.6 F (37 C), temperature source Oral, resp. rate 23, height 5\' 5"  (1.651 m), weight 61.508 kg (135 lb 9.6 oz), SpO2 100.00%.  Neurologic Examination: Mental Status: Alert, oriented, thought content appropriate.  Speech fluent without evidence of aphasia.  Able to follow 3 step commands without difficulty. Cranial Nerves: II: Discs flat bilaterally; Visual fields grossly normal III,IV, VI: ptosis not present, extra-ocular motions intact bilaterally V,VII: smile symmetric, facial light touch sensation normal bilaterally VIII: hearing normal bilaterally IX,X: gag reflex present XI: bilateral shoulder shrug XII: midline tongue extension Motor: Right : Upper extremity   5/5    Left:     Upper extremity   5/5  Lower extremity   5/5     Lower extremity   5/5 Tone and bulk:normal tone throughout; no atrophy noted Sensory: Pinprick and light touch intact throughout, bilaterally Deep Tendon Reflexes: 2+ throughout with absent AJ's bilaterally Plantars: Patient had withdrawal bilaterally Cerebellar: normal finger-to-nose and normal heel-to-shin test Gait: Unable to test CV: pulses palpable throughout    Laboratory Studies:  Basic Metabolic Panel:  Recent Labs Lab 09/26/13 2045  NA 132*  K 3.6  CL 95*  CO2 26  GLUCOSE 119*  BUN 24*  CREATININE 1.01  CALCIUM 9.2    Liver Function Tests:  Recent Labs Lab 09/26/13 2045  AST 34  ALT 17  ALKPHOS 55  BILITOT 0.4  PROT 7.1  ALBUMIN 3.4*   No results found for this basename: LIPASE, AMYLASE,  in the last 168 hours No results found for this basename: AMMONIA,  in the last 168  hours  CBC:  Recent Labs Lab 09/26/13 2045 09/27/13 0510  WBC 3.4* 2.7*  NEUTROABS 2.4  --   HGB 11.5* 11.0*  HCT 34.1* 33.1*  MCV 87.7 89.2  PLT 174 166    Cardiac Enzymes: No results found for this basename: CKTOTAL, CKMB, CKMBINDEX, TROPONINI,  in the last 168 hours  BNP: No components found with this basename: POCBNP,   CBG: No results found for this basename: GLUCAP,  in the last 168 hours  Microbiology: No results found for this or any previous visit.  Coagulation Studies: No results found for this basename: LABPROT, INR,  in the last 72 hours  Urinalysis: No results found for this basename: COLORURINE, APPERANCEUR, LABSPEC, PHURINE, GLUCOSEU, HGBUR, BILIRUBINUR, KETONESUR, PROTEINUR, UROBILINOGEN, NITRITE, LEUKOCYTESUR,  in the last 168 hours  Lipid Panel: No results found for this basename: chol, trig, hdl, cholhdl, vldl, ldlcalc    HgbA1C:  No results found for this basename: HGBA1C  Urine Drug Screen:   No results found for this basename: labopia, cocainscrnur, labbenz, amphetmu, thcu, labbarb    Alcohol Level: No results found for this basename: ETH,  in the last 168 hours   Imaging: Dg Chest 2 View  09/26/2013   CLINICAL DATA:  79 year old female with loss of consciousness. Initial encounter. Hypertension.  EXAM: CHEST  2 VIEW  COMPARISON:  12/30/2012 and earlier.  FINDINGS: Mildly lower lung volumes. Stable cardiomegaly and mediastinal contours. Chronic ectasia and calcified atherosclerosis of the thoracic aorta. No pneumothorax, pulmonary edema, pleural effusion or confluent pulmonary opacity. No acute osseous abnormality identified.  IMPRESSION: Stable cardiomegaly. No acute cardiopulmonary abnormality.   Electronically Signed   By: Augusto Gamble M.D.   On: 09/26/2013 21:09   Ct Head Wo Contrast  09/26/2013   CLINICAL DATA:  Syncope; loss of consciousness.  EXAM: CT HEAD WITHOUT CONTRAST  TECHNIQUE: Contiguous axial images were obtained from the base  of the skull through the vertex without intravenous contrast.  COMPARISON:  CT of the head performed 07/12/2012  FINDINGS: There is no evidence of acute infarction, mass lesion, or intra- or extra-axial hemorrhage on CT.  Prominence of the sulci suggests mild cortical volume loss. Scattered periventricular white matter change likely reflects small vessel ischemic microangiopathy. Mild cerebellar atrophy is noted.  The brainstem and fourth ventricle are within normal limits. The basal ganglia are unremarkable in appearance. The cerebral hemispheres demonstrate grossly normal gray-white differentiation. No mass effect or midline shift is seen.  There is no evidence of fracture; visualized osseous structures are unremarkable in appearance. The visualized portions of the orbits are within normal limits. The paranasal sinuses and mastoid air cells are well-aerated. No significant soft tissue abnormalities are seen.  IMPRESSION: 1. No acute intracranial pathology seen on CT. 2. Mild age-appropriate cortical volume loss and scattered small vessel ischemic microangiopathy.   Electronically Signed   By: Roanna Raider M.D.   On: 09/26/2013 21:21    Assessment: 79 y.o. female presenting after a syncopal episode that was preceded by facial droop and difficulty with speech.  Patient was seated at the time and improved with being placed in the lying position.  Patient now back to baseline.  Has been ill recently.  Head CT reviewed and shows no acute changes.  Differential is large and includes cardiac abnormalities as well as orthostasis, seizure and [name]/stroke.  Further work up recommended.    Stroke Risk Factors - hypertension  Plan: 1. HgbA1c, fasting lipid panel 2. MRI, MRA  of the brain without contrast 3. Orthostatic BP with heart rate q shift 4. Echocardiogram 5. Carotid dopplers 6. Prophylactic therapy-aspirin 81mg  daily 7. EEG 8. Telemetry monitoring 9. Frequent neuro checks   Thana Farr,  MD [facility] [phone] 09/27/2013, 5:54 AM

## 2013-09-27 NOTE — Progress Notes (Signed)
  Echocardiogram 2D Echocardiogram has been performed.  Wendy [name] 09/27/2013, 3:20 PM

## 2013-09-27 NOTE — H&P (Signed)
[facility] History and Physical  Wendy [name] WUX:[id number] DOB: 1917-06-26    PCP:   Elby Showers, MD   Chief Complaint: unresponsiveness.  HPI: Wendy [name] is an 79 y.o. female with hx of HTN, hypothyrodism, chronic constipation, brought to the [facility] ER via EMS as she became unconscious during her super.  Daughter said after eating, while at the table, she was having garble speech transiently, leaned toward the left, and became unconscious.  She had some eye twitching, but no other rhythmic movements.  She came about without any other complaints, and did not recall what had happened.  She denied HA, palpitation, nausea, vomiting or diaphoresis.  She did have urinary incontinence per report.  Her daughter said her thyroid medication was increased from to 150 mcg per day about a month ago.  There has not been any other med changes.  She has been on zesteretics for quite a while without dose changes.  Evalutiaon in the ER inlcuded an EKG which showed a RBBB, not new, at the HR about 60.  Her head CT was negative. She has no leukocytosis, normal renal fx tests, and electrolytes.  Neurology was consulted by EDP, and hospitalsit was asked to admit her for further work up.  She has no complaint at this time.  Rewiew of Systems:  Constitutional: Negative for malaise, fever and chills. No significant weight loss or weight gain Eyes: Negative for eye pain, redness and discharge, diplopia, visual changes, or flashes of light. ENMT: Negative for ear pain, hoarseness, nasal congestion, sinus pressure and sore throat. No headaches; tinnitus, drooling, or problem swallowing. Cardiovascular: Negative for chest pain, palpitations, diaphoresis, dyspnea and peripheral edema. ; No orthopnea, PND Respiratory: Negative for cough, hemoptysis, wheezing and stridor. No pleuritic chestpain. Gastrointestinal: Negative for nausea, vomiting, diarrhea, constipation, abdominal pain, melena, blood in  stool, hematemesis, jaundice and rectal bleeding.    Genitourinary: Negative for frequency, dysuria, incontinence,flank pain and hematuria; Musculoskeletal: Negative for back pain and neck pain. Negative for swelling and trauma.;  Skin: . Negative for pruritus, rash, abrasions, bruising and skin lesion.; ulcerations Neuro: Negative for headache, lightheadedness and neck stiffness. Negative for weakness, altered level of consciousness, extremity weakness, burning feet, involuntary movement, seizure and syncope.  Psych: negative for anxiety, depression, insomnia, tearfulness, panic attacks, hallucinations, paranoia, suicidal or homicidal ideation    Past Medical History  Diagnosis Date  . Thyroid disease   . Hypertension     Past Surgical History  Procedure Laterality Date  . Coronary angioplasty      Medications:  HOME MEDS: Prior to Admission medications   Medication Sig Start Date End Date Taking? Authorizing Provider  citalopram (CELEXA) 10 MG tablet Take 10 mg by mouth daily.  08/24/13  Yes Historical Provider, MD  diazepam (VALIUM) 2 MG tablet Take 2 mg by mouth at bedtime as needed. For insomnia.   Yes Historical Provider, MD  Emollient (CERAVE PM) LOTN Apply 1 application topically 2 (two) times daily.   Yes Historical Provider, MD  ENSURE (ENSURE) Take 237 mLs by mouth 2 (two) times daily between meals.   Yes Historical Provider, MD  hydrocortisone cream 1 % Apply to affected area 2 times daily 12/30/12  Yes April K Palumbo-Rasch, MD  hydrocortisone-pramoxine (PRAMOSONE) 1-2.5 % LOTN Apply 1 application topically 2 (two) times daily. Take twice daily for two weeks then PM only for two weeks then stop   Yes Historical Provider, MD  levothyroxine (SYNTHROID, LEVOTHROID) 150 MCG tablet Take 150 mcg  by mouth daily before breakfast.   Yes Historical Provider, MD  losartan-hydrochlorothiazide (HYZAAR) 100-25 MG per tablet Take 1 tablet by mouth daily.   Yes Historical Provider, MD   meclizine (ANTIVERT) 25 MG tablet Take 25 mg by mouth 3 (three) times daily as needed. For vertigo.   Yes Historical Provider, MD     Allergies:  No Known Allergies  Social History:   reports that she has never smoked. She does not have any smokeless tobacco history on file. She reports that she does not drink alcohol or use illicit drugs.  Family History: No family history on file.   Physical Exam: Filed Vitals:   09/26/13 2202 09/26/13 2202 09/26/13 2318 09/27/13 0057  BP: 135/67 118/87 139/70   Pulse: 67 81 58   Temp:    98.7 F (37.1 C)  TempSrc:      Resp:   18   SpO2:   100%    Blood pressure 139/70, pulse 58, temperature 98.7 F (37.1 C), temperature source Oral, resp. rate 18, SpO2 100.00%.  GEN:  Pleasant  patient lying in the stretcher in no acute distress; cooperative with exam. PSYCH:  alert and oriented x4; does not appear anxious or depressed; affect is appropriate. HEENT: Mucous membranes pink and anicteric; PERRLA; EOM intact; no cervical lymphadenopathy nor thyromegaly or carotid bruit; no JVD; There were no stridor. Neck is very supple. Breasts:: Not examined CHEST WALL: No tenderness CHEST: Normal respiration, clear to auscultation bilaterally.  HEART: Regular rate and rhythm.  There are no murmur, rub, or gallops.   BACK: No kyphosis or scoliosis; no CVA tenderness ABDOMEN: soft and non-tender; no masses, no organomegaly, normal abdominal bowel sounds; no pannus; no intertriginous candida. There is no rebound and no distention. Rectal Exam: Not done EXTREMITIES: No bone or joint deformity; age-appropriate arthropathy of the hands and knees; no edema; no ulcerations.  There is no calf tenderness. Genitalia: not examined PULSES: 2+ and symmetric SKIN: Normal hydration no rash or ulceration CNS: Cranial nerves 2-12 grossly intact no focal lateralizing neurologic deficit.  Speech is fluent; uvula elevated with phonation, facial symmetry and tongue midline.   barbinski is negative and strengths are equaled bilaterally.  No sensory loss. Visual field is full.   Labs on Admission:  Basic Metabolic Panel:  Recent Labs Lab 09/26/13 2045  NA 132*  K 3.6  CL 95*  CO2 26  GLUCOSE 119*  BUN 24*  CREATININE 1.01  CALCIUM 9.2   Liver Function Tests:  Recent Labs Lab 09/26/13 2045  AST 34  ALT 17  ALKPHOS 55  BILITOT 0.4  PROT 7.1  ALBUMIN 3.4*   No results found for this basename: LIPASE, AMYLASE,  in the last 168 hours No results found for this basename: AMMONIA,  in the last 168 hours CBC:  Recent Labs Lab 09/26/13 2045  WBC 3.4*  NEUTROABS 2.4  HGB 11.5*  HCT 34.1*  MCV 87.7  PLT 174   Cardiac Enzymes: No results found for this basename: CKTOTAL, CKMB, CKMBINDEX, TROPONINI,  in the last 168 hours  CBG: No results found for this basename: GLUCAP,  in the last 168 hours   Radiological Exams on Admission: Dg Chest 2 View  09/26/2013   CLINICAL DATA:  79 year old female with loss of consciousness. Initial encounter. Hypertension.  EXAM: CHEST  2 VIEW  COMPARISON:  12/30/2012 and earlier.  FINDINGS: Mildly lower lung volumes. Stable cardiomegaly and mediastinal contours. Chronic ectasia and calcified atherosclerosis of the thoracic aorta.  No pneumothorax, pulmonary edema, pleural effusion or confluent pulmonary opacity. No acute osseous abnormality identified.  IMPRESSION: Stable cardiomegaly. No acute cardiopulmonary abnormality.   Electronically Signed   By: Augusto Gamble M.D.   On: 09/26/2013 21:09   Ct Head Wo Contrast  09/26/2013   CLINICAL DATA:  Syncope; loss of consciousness.  EXAM: CT HEAD WITHOUT CONTRAST  TECHNIQUE: Contiguous axial images were obtained from the base of the skull through the vertex without intravenous contrast.  COMPARISON:  CT of the head performed 07/12/2012  FINDINGS: There is no evidence of acute infarction, mass lesion, or intra- or extra-axial hemorrhage on CT.  Prominence of the sulci suggests mild  cortical volume loss. Scattered periventricular white matter change likely reflects small vessel ischemic microangiopathy. Mild cerebellar atrophy is noted.  The brainstem and fourth ventricle are within normal limits. The basal ganglia are unremarkable in appearance. The cerebral hemispheres demonstrate grossly normal gray-white differentiation. No mass effect or midline shift is seen.  There is no evidence of fracture; visualized osseous structures are unremarkable in appearance. The visualized portions of the orbits are within normal limits. The paranasal sinuses and mastoid air cells are well-aerated. No significant soft tissue abnormalities are seen.  IMPRESSION: 1. No acute intracranial pathology seen on CT. 2. Mild age-appropriate cortical volume loss and scattered small vessel ischemic microangiopathy.   Electronically Signed   By: Roanna Raider M.D.   On: 09/26/2013 21:21    EKG: Independently reviewed.  SR with RBBB, no acute ST-T changes.   Assessment/Plan Present on Admission:  . Altered mental status . Hypothyroidism . Depression Syncope. [name]/CVA.  PLAN:  I suspect that this is syncope.  She has been on diuretics and ACE-I, and may have had hypotension with subsequent syncope.  Clinically, she is a little dehydrated as well.  Will give gentle IVF, hold her zesteretic for now.  DDx also include seizure or [name]/CVA.  Will get EEG, carotid, ECHO, and MRI/MRA of her brain as well.  She also could be hypothyroid. Will check TSH and continue with current dose.  Note she had dose increase about 4 weeks ago.  I have continued her medication except for her Valium.  She is otherwise stable, full code (re-confirmed tonight), and will be admitted to [facility] under [facility] service for further [name]/CVA work up.  Neuro will consult.  Start her on ASA if passed bedside swallowing test.  Thank you for allowing me to participate in her care.  Other plans as per orders.  Code Status: FULL Unk Lightning,  MD. [facility] Pager [phone] 7pm to 7am.  09/27/2013, 1:10 AM

## 2013-09-27 NOTE — Progress Notes (Signed)
EEG completed; results pending.    

## 2013-09-27 NOTE — Progress Notes (Signed)
Follow-up:  Pt is s/p transfer from [facility] to [facility] room 3W-15. Wendy [name] is an 79 y.o. female with hx of HTN, hypothyrodism, chronic constipation brought to the [facility] ED via EMS after she became unconscious during her dinner meal. Daughter reported that after pt ate, while at the table, she was having garbled speech transiently, leaned toward the left, and became unconscious. Pt admitted for syncope vs seizure vs [name]/CVA. Neurology will consult. At bedside pt is now quite alert and oriented. She is answering admission questions appropriately and has had no further c/o's since her arrival.  BP is somewhat elevated at 181/73, remaining VSS. Daughter is at bedside. Will continue to monitor closely.  Leanne Chang, NP-C [facility] Pager [phone]

## 2013-09-27 NOTE — Progress Notes (Signed)
Utilization review completed. Jashley Yellin, RN, BSN. 

## 2013-09-27 NOTE — Progress Notes (Signed)
VASCULAR LAB PRELIMINARY  PRELIMINARY  PRELIMINARY  PRELIMINARY  Carotid duplex  completed.    Preliminary report:  Bilateral:  1-39% ICA stenosis.  Vertebral artery flow is antegrade.      Sue Fernicola, RVT 09/27/2013, 2:12 PM

## 2013-09-27 NOTE — Procedures (Signed)
ELECTROENCEPHALOGRAM REPORT   Patient: Wendy [name]       Room #: 9U04 EEG No. ID: [id number] Age: 79 y.o.        Sex: female Referring Physician: Regalado Report Date:  09/27/2013        Interpreting Physician: Aline Brochure  History: [name] is an 79 y.o. female who experienced an episode of loss of consciousness on 09/26/2013. Etiology is unclear.  Indications for study:  Rule out new onset seizure disorder.  Technique: This is an 18 channel routine scalp EEG performed at the bedside with bipolar and monopolar montages arranged in accordance to the international 10/20 system of electrode placement.   Description: This EEG recording was performed during wakefulness and during drowsiness. Background activity during wakefulness consisted of 8-9 Hz symmetrical alpha rhythm. Photic stimulation was not performed. Hyperventilation was not performed. During drowsiness there was diffuse symmetrical mild generalized slowing of cerebral activity. Stage II sleep was not achieved. No epileptiform discharges were recorded.  Interpretation: This is a normal EEG recording during wakefulness and during drowsiness. No evidence of an epileptic disorder was demonstrated.   Venetia Maxon M.D. [facility] [phone]

## 2013-09-28 DIAGNOSIS — R4182 Altered mental status, unspecified: Secondary | ICD-10-CM

## 2013-09-28 DIAGNOSIS — I10 Essential (primary) hypertension: Secondary | ICD-10-CM

## 2013-09-28 DIAGNOSIS — R55 Syncope and collapse: Secondary | ICD-10-CM | POA: Diagnosis present

## 2013-09-28 LAB — URINALYSIS, ROUTINE W REFLEX MICROSCOPIC
Glucose, UA: NEGATIVE mg/dL
Hgb urine dipstick: NEGATIVE
Protein, ur: NEGATIVE mg/dL
Specific Gravity, Urine: 1.015 (ref 1.005–1.030)
pH: 6 (ref 5.0–8.0)

## 2013-09-28 LAB — GLUCOSE, CAPILLARY
Glucose-Capillary: 114 mg/dL — ABNORMAL HIGH (ref 70–99)
Glucose-Capillary: 68 mg/dL — ABNORMAL LOW (ref 70–99)
Glucose-Capillary: 78 mg/dL (ref 70–99)
Glucose-Capillary: 81 mg/dL (ref 70–99)

## 2013-09-28 LAB — URINE MICROSCOPIC-ADD ON

## 2013-09-28 MED ORDER — LOSARTAN POTASSIUM-HCTZ 100-25 MG PO TABS: 0.5000 | ORAL_TABLET | Freq: Every day | ORAL | Status: DC

## 2013-09-28 MED ORDER — STROKE: EARLY STAGES OF RECOVERY BOOK
Freq: Once | Status: AC
Start: 2013-09-28 — End: 2013-09-28
  Administered 2013-09-28: 06:00:00
  Filled 2013-09-28: qty 1

## 2013-09-28 MED ORDER — LORAZEPAM 2 MG/ML IJ SOLN
1.0000 mg | Freq: Once | INTRAMUSCULAR | Status: AC
  Administered 2013-09-28: 1 mg via INTRAVENOUS
  Filled 2013-09-28: qty 1

## 2013-09-28 MED ORDER — GUAIFENESIN ER 600 MG PO TB12: 600.0000 mg | ORAL_TABLET | Freq: Two times a day (BID) | ORAL | Status: DC | PRN

## 2013-09-28 NOTE — Progress Notes (Signed)
Pt's daughter given prescriptions. Sanda Linger, RN

## 2013-09-28 NOTE — Discharge Summary (Signed)
Physician Discharge Summary  Wendy [name] XBJ:[id number] DOB: 15-Jun-1917 DOA: 09/26/2013  PCP: Elby Showers, MD  Admit date: 09/26/2013 Discharge date: 09/28/2013  Time spent: 35 minutes  Recommendations for Outpatient Follow-up:  1. Needs B-met to follow sodium level.  2. Needs referral to cardiology for holter monitor.   Discharge Diagnoses:    Syncope and collaps   Altered mental status   Hypothyroidism   Depression   HTN (hypertension)   Discharge Condition: Stable.   Diet recommendation: Heart healthy  Filed Weights   09/27/13 0400  Weight: 61.508 kg (135 lb 9.6 oz)    History of present illness:  Wendy [name] is an 79 y.o. female with hx of HTN, hypothyrodism, chronic constipation, brought to the [facility] ER via EMS as she became unconscious during her super. Daughter said after eating, while at the table, she was having garble speech transiently, leaned toward the left, and became unconscious. She had some eye twitching, but no other rhythmic movements. She came about without any other complaints, and did not recall what had happened. She denied HA, palpitation, nausea, vomiting or diaphoresis. She did have urinary incontinence per report. Her daughter said her thyroid medication was increased from to 150 mcg per day about a month ago. There has not been any other med changes. She has been on zesteretics for quite a while without dose changes. Evalutiaon in the ER inlcuded an EKG which showed a RBBB, not new, at the HR about 60. Her head CT was negative. She has no leukocytosis, normal renal fx tests, and electrolytes. Neurology was consulted by EDP, and hospitalsit was asked to admit her for further work up. She has no complaint at this time.   Hospital Course:  1-AMS; ? Syncope, [name]. MRI negative for stroke. ECHO with norma; EF, carotid doppler no significant stenosis. . Neuro recommend outpatient holter monitor. Syncope could be vasovagal. Vs secondary to  transient hypotension. 2-Hypothyroidism: continue with synthroid. 3-Hyponatremia: received IV fluids.    Procedures: ECHO: Left ventricle: The cavity size was normal. There was moderate concentric hypertrophy. Systolic function was normal. The estimated ejection fraction was in the range of 60% to 65%. Wall motion was normal; there were no regional wall motion abnormalities. Doppler parameters are consistent with abnormal left ventricular relaxation (grade 1 diastolic dysfunction). - Atrial septum: No defect or patent foramen ovale was identified.  Carotid doppler: - The vertebral arteries appear patent with antegrade flow. - Findings consistent with 1-39 percent stenosis involving the right internal carotid artery and the left internal carotid artery.   Consultations:  Neurology  Discharge Exam: Filed Vitals:   09/28/13 0622  BP: 144/72  Pulse:   Temp:   Resp:     General: No distress, alert.  Cardiovascular: S 1, S 2 RRR Respiratory: CTA  Discharge Instructions  Discharge Orders   Future Appointments Provider Department Dept Phone   11/27/2013 11:15 AM Max Maud Deed, DPM [facility] [phone]   Future Orders Complete By Expires   Diet - low sodium heart healthy  As directed    Increase activity slowly  As directed        Medication List         CERAVE PM Lotn  Apply 1 application topically 2 (two) times daily.     citalopram 10 MG tablet  Commonly known as:  CELEXA  Take 10 mg by mouth daily.     diazepam 2 MG tablet  Commonly known as:  VALIUM  Take 2 mg by mouth at bedtime as needed. For insomnia.     ENSURE  Take 237 mLs by mouth 2 (two) times daily between meals.     guaiFENesin 600 MG 12 hr tablet  Commonly known as:  MUCINEX  Take 1 tablet (600 mg total) by mouth 2 (two) times daily as needed for cough.     hydrocortisone cream 1 %  Apply to affected area 2 times daily     hydrocortisone-pramoxine 1-2.5 % Lotn   Commonly known as:  PRAMOSONE  Apply 1 application topically 2 (two) times daily. Take twice daily for two weeks then PM only for two weeks then stop     levothyroxine 150 MCG tablet  Commonly known as:  SYNTHROID, LEVOTHROID  Take 150 mcg by mouth daily before breakfast.     losartan-hydrochlorothiazide 100-25 MG per tablet  Commonly known as:  HYZAAR  Take 0.5 tablets by mouth daily.     meclizine 25 MG tablet  Commonly known as:  ANTIVERT  Take 25 mg by mouth 3 (three) times daily as needed. For vertigo.       No Known Allergies    The results of significant diagnostics from this hospitalization (including imaging, microbiology, ancillary and laboratory) are listed below for reference.    Significant Diagnostic Studies: Dg Chest 2 View  09/26/2013   CLINICAL DATA:  79 year old female with loss of consciousness. Initial encounter. Hypertension.  EXAM: CHEST  2 VIEW  COMPARISON:  12/30/2012 and earlier.  FINDINGS: Mildly lower lung volumes. Stable cardiomegaly and mediastinal contours. Chronic ectasia and calcified atherosclerosis of the thoracic aorta. No pneumothorax, pulmonary edema, pleural effusion or confluent pulmonary opacity. No acute osseous abnormality identified.  IMPRESSION: Stable cardiomegaly. No acute cardiopulmonary abnormality.   Electronically Signed   By: Augusto Gamble M.D.   On: 09/26/2013 21:09   Ct Head Wo Contrast  09/26/2013   CLINICAL DATA:  Syncope; loss of consciousness.  EXAM: CT HEAD WITHOUT CONTRAST  TECHNIQUE: Contiguous axial images were obtained from the base of the skull through the vertex without intravenous contrast.  COMPARISON:  CT of the head performed 07/12/2012  FINDINGS: There is no evidence of acute infarction, mass lesion, or intra- or extra-axial hemorrhage on CT.  Prominence of the sulci suggests mild cortical volume loss. Scattered periventricular white matter change likely reflects small vessel ischemic microangiopathy. Mild cerebellar  atrophy is noted.  The brainstem and fourth ventricle are within normal limits. The basal ganglia are unremarkable in appearance. The cerebral hemispheres demonstrate grossly normal gray-white differentiation. No mass effect or midline shift is seen.  There is no evidence of fracture; visualized osseous structures are unremarkable in appearance. The visualized portions of the orbits are within normal limits. The paranasal sinuses and mastoid air cells are well-aerated. No significant soft tissue abnormalities are seen.  IMPRESSION: 1. No acute intracranial pathology seen on CT. 2. Mild age-appropriate cortical volume loss and scattered small vessel ischemic microangiopathy.   Electronically Signed   By: Roanna Raider M.D.   On: 09/26/2013 21:21   Mri Brain Without Contrast  09/27/2013   CLINICAL DATA:  Loss of consciousness.  Garbled speech.  EXAM: MRI HEAD WITHOUT CONTRAST  MRA HEAD WITHOUT CONTRAST  TECHNIQUE: Multiplanar, multiecho pulse sequences of the brain and surrounding structures were obtained without intravenous contrast. Angiographic images of the head were obtained using MRA technique without contrast.  COMPARISON:  CT 09/26/2013  FINDINGS: MRI HEAD FINDINGS  Image quality degraded by motion. There  is moderate atrophy. Chronic microvascular ischemic changes are present in the white matter. Chronic microvascular ischemic change in the pons. Small chronic infarcts in the cerebellum bilaterally.  Negative for acute infarct. Chronic micro hemorrhage right parietal periventricular white matter. No mass or edema in the brain.  Mucosal edema in the maxillary sinuses bilaterally with small air-fluid levels. Mucosal edema in the ethmoid sinuses bilaterally.  MRA HEAD FINDINGS  Image quality limited by patient motion and patient positioning. There is suboptimal signal in the circle Willis due to patient positioning.  Both vertebral arteries are patent. Mild stenosis distal right vertebral artery. The basilar  and posterior cerebral arteries are patent. There is tortuosity of the left posterior cerebral artery which appears patent. Possible stenosis in the posterior cerebral artery bilaterally.  Atherosclerotic irregularity in the cavernous carotid bilaterally which is inadequately evaluated in the study. The anterior and middle cerebral arteries are not adequately evaluated due to patient positioning and motion.  IMPRESSION: Atrophy and chronic microvascular ischemia.  No acute infarct.  Sinusitis.  Limited MRA of the brain due to motion and patient positioning. Repeat study may be necessary.   Electronically Signed   By: Marlan Palau M.D.   On: 09/27/2013 19:20   Mr Maxine Glenn Head/brain Wo Cm  09/27/2013   CLINICAL DATA:  Loss of consciousness.  Garbled speech.  EXAM: MRI HEAD WITHOUT CONTRAST  MRA HEAD WITHOUT CONTRAST  TECHNIQUE: Multiplanar, multiecho pulse sequences of the brain and surrounding structures were obtained without intravenous contrast. Angiographic images of the head were obtained using MRA technique without contrast.  COMPARISON:  CT 09/26/2013  FINDINGS: MRI HEAD FINDINGS  Image quality degraded by motion. There is moderate atrophy. Chronic microvascular ischemic changes are present in the white matter. Chronic microvascular ischemic change in the pons. Small chronic infarcts in the cerebellum bilaterally.  Negative for acute infarct. Chronic micro hemorrhage right parietal periventricular white matter. No mass or edema in the brain.  Mucosal edema in the maxillary sinuses bilaterally with small air-fluid levels. Mucosal edema in the ethmoid sinuses bilaterally.  MRA HEAD FINDINGS  Image quality limited by patient motion and patient positioning. There is suboptimal signal in the circle Willis due to patient positioning.  Both vertebral arteries are patent. Mild stenosis distal right vertebral artery. The basilar and posterior cerebral arteries are patent. There is tortuosity of the left posterior  cerebral artery which appears patent. Possible stenosis in the posterior cerebral artery bilaterally.  Atherosclerotic irregularity in the cavernous carotid bilaterally which is inadequately evaluated in the study. The anterior and middle cerebral arteries are not adequately evaluated due to patient positioning and motion.  IMPRESSION: Atrophy and chronic microvascular ischemia.  No acute infarct.  Sinusitis.  Limited MRA of the brain due to motion and patient positioning. Repeat study may be necessary.   Electronically Signed   By: Marlan Palau M.D.   On: 09/27/2013 19:20    Microbiology: No results found for this or any previous visit (from the past 240 hour(s)).   Labs: Basic Metabolic Panel:  Recent Labs Lab 09/26/13 2045 09/27/13 0510  NA 132*  --   K 3.6  --   CL 95*  --   CO2 26  --   GLUCOSE 119*  --   BUN 24*  --   CREATININE 1.01 0.84  CALCIUM 9.2  --    Liver Function Tests:  Recent Labs Lab 09/26/13 2045  AST 34  ALT 17  ALKPHOS 55  BILITOT 0.4  PROT  7.1  ALBUMIN 3.4*   No results found for this basename: LIPASE, AMYLASE,  in the last 168 hours No results found for this basename: AMMONIA,  in the last 168 hours CBC:  Recent Labs Lab 09/26/13 2045 09/27/13 0510  WBC 3.4* 2.7*  NEUTROABS 2.4  --   HGB 11.5* 11.0*  HCT 34.1* 33.1*  MCV 87.7 89.2  PLT 174 166   Cardiac Enzymes:  Recent Labs Lab 09/27/13 0510 09/27/13 0955 09/27/13 1640  TROPONINI <0.30 <0.30 <0.30   BNP: BNP (last 3 results) No results found for this basename: PROBNP,  in the last 8760 hours CBG:  Recent Labs Lab 09/27/13 2032 09/28/13 0003 09/28/13 0422 09/28/13 0742 09/28/13 0852  GLUCAP 97 81 78 68* 86       Signed:  Katleen Carraway  [facility] 09/28/2013, 9:35 AM

## 2013-09-28 NOTE — Progress Notes (Signed)
Pt's daughter & caregiver was given discharge teaching. Pt's IV was removed. Pt's daughter was given 2 copies of the discharge paperwork. Pt's daughter was informed of appointments & when to call 911. Sanda Linger

## 2013-09-28 NOTE — Care Management Note (Signed)
    Page 1 of 1   09/28/2013     4:17:49 PM   CARE MANAGEMENT NOTE 09/28/2013  Patient:  MELIAH, APPLEMAN   Account Number:  [id number]  Date Initiated:  09/28/2013  Documentation initiated by:  GRAVES-BIGELOW,Santiana Glidden  Subjective/Objective Assessment:   Pt admitted for syncope and plans for d/c back to home.     Action/Plan:   Cm was able ot speak to daughter and she chose [facility] for pt. RN and PT services referral sent to [place]. SOC to begin within 24-48 hours post d/c. MD please write orders and face to face.   Anticipated DC Date:  09/28/2013   Anticipated DC Plan:  HOME W HOME HEALTH SERVICES      DC Planning Services  CM consult      [facility] Choice  HOME HEALTH   Choice offered to / List presented to:  C-4 Adult Children        HH arranged  HH-10 DISEASE MANAGEMENT  HH-1 RN  HH-2 PT      [facility] agency  [facility]   Status of service:  Completed, signed off Medicare Important Message given?   (If response is "NO", the following Medicare IM given date fields will be blank) Date Medicare IM given:   Date Additional Medicare IM given:    Discharge Disposition:  HOME W HOME HEALTH SERVICES  Per UR Regulation:  Reviewed for med. necessity/level of care/duration of stay  If discussed at Long Length of Stay Meetings, dates discussed:    Comments:

## 2013-09-28 NOTE — Evaluation (Signed)
Physical Therapy Evaluation Patient Details Name: Wendy [name] MRN: [id number] DOB: 23-Oct-1916 Today's Date: 09/28/2013 Time: 5621-3086 PT Time Calculation (min): 22 min  PT Assessment / Plan / Recommendation History of Present Illness  79 y.o. female admitted to [facility] on 09/26/13 with syncopal episode that was preceded by facial droop and difficulty with speech. Patient currently back to baseline. MRI brain showed no acute infarct and MRA was poor quality due to movement but did not show any large vessel stenosis.  Echo and Carotid dopplers WNL.  A1c elevated at 6.0 and LDL slightly elevated at 102. EEG showed no epileptiform activity. Orthostatic BP have been inconsistent. At this time cannot fully exclude [name], however [name] work up is negative.    Clinical Impression  Pt is moving well requiring min-min guard assist for gait and transfers.  She was able to demonstrate the ability to go up and down nine stairs with min assist and a railing.  There is no family present at this time to confirm her baseline level of mobility, but she could benefit from some HHPT f/u at discharge as she has a staggering gait pattern and is at risk of falls.  PT to follow acutely for deficits listed below.     PT Assessment  Patient needs continued PT services    Follow Up Recommendations  Home health PT;Supervision for mobility/OOB    Does the patient have the potential to tolerate intense rehabilitation     NA  Barriers to Discharge   None      Equipment Recommendations  None recommended by PT    Recommendations for Other Services   None  Frequency Min 3X/week    Precautions / Restrictions Precautions Precautions: Fall   Pertinent Vitals/Pain See vitals flow sheet.      Mobility  Bed Mobility Bed Mobility: Not assessed (pt seated in recliner chair) Transfers Transfers: Sit to Stand;Stand to Sit;Stand Pivot Transfers Sit to Stand: 4: Min guard;With upper extremity assist;With armrests;From  chair/3-in-1 Stand to Sit: 4: Min guard;With upper extremity assist;With armrests;To chair/3-in-1 Details for Transfer Assistance: min guard assist to steady pt for balance during transitions.   Ambulation/Gait Ambulation/Gait Assistance: 4: Min guard Ambulation Distance (Feet): 300 Feet Assistive device: 1 person hand held assist Ambulation/Gait Assistance Details: Min guard assist to steady pt for balance epscially when turning 180 degrees or walking with head turned to talk.  Pt with mildly staggering gait pattern and slow speed.   Gait Pattern: Step-through pattern;Shuffle (staggering) Gait velocity: decreased Stairs: Yes Stairs Assistance: 4: Min assist Stairs Assistance Details (indicate cue type and reason): min assist to support pt for safety and balance especially while descending stairs.  Stair Management Technique: One rail Right;One rail Left;Alternating pattern;Step to pattern;Forwards (alternating up, step to down) Number of Stairs: 9 Modified Rankin (Stroke Patients Only) Pre-Morbid Rankin Score:  (unknown) Modified Rankin: Moderately severe disability    Exercises General Exercises - Upper Extremity Shoulder Flexion: AROM;Both;10 reps;Seated Elbow Flexion: AROM;Both;10 reps;Seated General Exercises - Lower Extremity Long Arc Quad: AROM;Both;10 reps;Seated Hip ABduction/ADduction: AROM;Both;10 reps;Seated Hip Flexion/Marching: AROM;Both;10 reps;Seated Toe Raises: AROM;Both;10 reps;Seated Heel Raises: AROM;Both;10 reps;Seated   PT Diagnosis: Difficulty walking;Abnormality of gait;Generalized weakness  PT Problem List: Decreased strength;Decreased activity tolerance;Decreased balance;Decreased mobility PT Treatment Interventions: DME instruction;Gait training;Stair training;Functional mobility training;Therapeutic exercise;Therapeutic activities;Balance training;Neuromuscular re-education;Cognitive remediation;Patient/family education     PT Goals(Current goals can be  found in the care plan section) Acute Rehab PT Goals Patient Stated Goal: to go home PT Goal Formulation:  With patient Time For Goal Achievement: 10/12/13 Potential to Achieve Goals: Good  Visit Information  Last PT Received On: 09/28/13 Assistance Needed: +1 History of Present Illness: 79 y.o. female admitted to [facility] on 09/26/13 with syncopal episode that was preceded by facial droop and difficulty with speech. Patient currently back to baseline. MRI brain showed no acute infarct and MRA was poor quality due to movement but did not show any large vessel stenosis.  Echo and Carotid dopplers WNL.  A1c elevated at 6.0 and LDL slightly elevated at 102. EEG showed no epileptiform activity. Orthostatic BP have been inconsistent. At this time cannot fully exclude [name], however [name] work up is negative.         Prior Functioning  Home Living Family/patient expects to be discharged to:: Private residence Living Arrangements: Children Available Help at Discharge: Family;Available 24 hours/day Type of Home: House Home Access: Level entry Home Layout: Two level Alternate Level Stairs-Number of Steps: flight Alternate Level Stairs-Rails: Right Additional Comments: Pt is a questionable historian Prior Function Comments: no family present to confirm Communication Communication: No difficulties    Cognition  Cognition Arousal/Alertness: Awake/alert Behavior During Therapy: WFL for tasks assessed/performed Overall Cognitive Status: History of cognitive impairments - at baseline    Extremity/Trunk Assessment Upper Extremity Assessment Upper Extremity Assessment: Generalized weakness Lower Extremity Assessment Lower Extremity Assessment: Generalized weakness Cervical / Trunk Assessment Cervical / Trunk Assessment: Normal   Balance Balance Balance Assessed: Yes Static Sitting Balance Static Sitting - Balance Support: Bilateral upper extremity supported;Feet supported Static Sitting - Level of  Assistance: 6: Modified independent (Device/Increase time) Static Standing Balance Static Standing - Balance Support: Left upper extremity supported Static Standing - Level of Assistance: 4: Min assist Dynamic Standing Balance Dynamic Standing - Balance Support: Left upper extremity supported Dynamic Standing - Level of Assistance: 4: Min assist  End of Session PT - End of Session Equipment Utilized During Treatment: Gait belt Activity Tolerance: Patient tolerated treatment well Patient left: in chair;with call bell/phone within reach;with chair alarm set  GP Functional Assessment Tool Used: assist levels Functional Limitation: Mobility: Walking and moving around Mobility: Walking and Moving Around Current Status [phone]): At least 20 percent but less than 40 percent impaired, limited or restricted Mobility: Walking and Moving Around Goal Status [phone]): At least 1 percent but less than 20 percent impaired, limited or restricted   Daijon Wenke B. Braidan Ricciardi, PT, DPT [phone]   09/28/2013, 5:15 PM

## 2013-09-28 NOTE — Progress Notes (Signed)
NEURO HOSPITALIST PROGRESS NOTE   SUBJECTIVE:                                                                                                                        Patient resting comfortably--back to her baseline per family member who is in the room.   OBJECTIVE:                                                                                                                           Vital signs in last 24 hours: Temp:  [97.4 F (36.3 C)-99.3 F (37.4 C)] 98.2 F (36.8 C) (12/11 0615) Pulse Rate:  [52-77] 66 (12/11 0615) Resp:  [18-20] 18 (12/11 0617) BP: (113-167)/(20-84) 144/72 mmHg (12/11 0622) SpO2:  [96 %-99 %] 98 % (12/11 0622)  Intake/Output from previous day: 12/10 0701 - 12/11 0700 In: 800 [I.V.:800] Out: 500 [Urine:500] Intake/Output this shift: Total I/O In: 360 [P.O.:360] Out: -  Nutritional status: Cardiac  Past Medical History  Diagnosis Date  . Thyroid disease   . Hypertension      Neurologic Exam:  Mental Status: Alert, not oriented to place, date, year.  Speech fluent without evidence of aphasia.  Able to follow 3 step commands without difficulty. Cranial Nerves: II: Discs flat bilaterally; Visual fields grossly normal, pupils equal, round, reactive to light and accommodation III,IV, VI: ptosis not present, extra-ocular motions intact bilaterally V,VII: smile symmetric, facial light touch sensation normal bilaterally VIII: hearing normal bilaterally IX,X: gag reflex present XI: bilateral shoulder shrug XII: midline tongue extension without atrophy or fasciculations  Motor: Right : Upper extremity   5/5    Left:     Upper extremity   5/5  Lower extremity   5/5     Lower extremity   5/5 --positive palmomental Tone and bulk:normal tone throughout; no atrophy noted Sensory: Pinprick and light touch intact throughout, bilaterally Deep Tendon Reflexes:  Right: Upper Extremity   Left: Upper extremity   biceps  (C-5 to C-6) 2/4   biceps (C-5 to C-6) 2/4 tricep (C7) 2/4    triceps (C7) 2/4 Brachioradialis (C6) 2/4  Brachioradialis (C6) 2/4  Lower Extremity Lower Extremity  quadriceps (L-2 to L-4) 2/4   quadriceps (L-2 to L-4)  2/4 Achilles (S1) 2/4   Achilles (S1) 2/4  Plantars: Right: downgoing   Left: downgoing Cerebellar: normal finger-to-nose,  normal heel-to-shin test CV: pulses palpable throughout    Lab Results: Lab Results  Component Value Date/Time   CHOL 171 09/27/2013  5:10 AM   Lipid Panel  Recent Labs  09/27/13 0510  CHOL 171  TRIG 67  HDL 56  CHOLHDL 3.1  VLDL 13  LDLCALC 161*    Studies/Results: Dg Chest 2 View  09/26/2013   CLINICAL DATA:  79 year old female with loss of consciousness. Initial encounter. Hypertension.  EXAM: CHEST  2 VIEW  COMPARISON:  12/30/2012 and earlier.  FINDINGS: Mildly lower lung volumes. Stable cardiomegaly and mediastinal contours. Chronic ectasia and calcified atherosclerosis of the thoracic aorta. No pneumothorax, pulmonary edema, pleural effusion or confluent pulmonary opacity. No acute osseous abnormality identified.  IMPRESSION: Stable cardiomegaly. No acute cardiopulmonary abnormality.   Electronically Signed   By: Augusto Gamble M.D.   On: 09/26/2013 21:09   Ct Head Wo Contrast  09/26/2013   CLINICAL DATA:  Syncope; loss of consciousness.  EXAM: CT HEAD WITHOUT CONTRAST  TECHNIQUE: Contiguous axial images were obtained from the base of the skull through the vertex without intravenous contrast.  COMPARISON:  CT of the head performed 07/12/2012  FINDINGS: There is no evidence of acute infarction, mass lesion, or intra- or extra-axial hemorrhage on CT.  Prominence of the sulci suggests mild cortical volume loss. Scattered periventricular white matter change likely reflects small vessel ischemic microangiopathy. Mild cerebellar atrophy is noted.  The brainstem and fourth ventricle are within normal limits. The basal ganglia are unremarkable in  appearance. The cerebral hemispheres demonstrate grossly normal gray-white differentiation. No mass effect or midline shift is seen.  There is no evidence of fracture; visualized osseous structures are unremarkable in appearance. The visualized portions of the orbits are within normal limits. The paranasal sinuses and mastoid air cells are well-aerated. No significant soft tissue abnormalities are seen.  IMPRESSION: 1. No acute intracranial pathology seen on CT. 2. Mild age-appropriate cortical volume loss and scattered small vessel ischemic microangiopathy.   Electronically Signed   By: Roanna Raider M.D.   On: 09/26/2013 21:21   Mri Brain Without Contrast  09/27/2013   CLINICAL DATA:  Loss of consciousness.  Garbled speech.  EXAM: MRI HEAD WITHOUT CONTRAST  MRA HEAD WITHOUT CONTRAST  TECHNIQUE: Multiplanar, multiecho pulse sequences of the brain and surrounding structures were obtained without intravenous contrast. Angiographic images of the head were obtained using MRA technique without contrast.  COMPARISON:  CT 09/26/2013  FINDINGS: MRI HEAD FINDINGS  Image quality degraded by motion. There is moderate atrophy. Chronic microvascular ischemic changes are present in the white matter. Chronic microvascular ischemic change in the pons. Small chronic infarcts in the cerebellum bilaterally.  Negative for acute infarct. Chronic micro hemorrhage right parietal periventricular white matter. No mass or edema in the brain.  Mucosal edema in the maxillary sinuses bilaterally with small air-fluid levels. Mucosal edema in the ethmoid sinuses bilaterally.  MRA HEAD FINDINGS  Image quality limited by patient motion and patient positioning. There is suboptimal signal in the circle Willis due to patient positioning.  Both vertebral arteries are patent. Mild stenosis distal right vertebral artery. The basilar and posterior cerebral arteries are patent. There is tortuosity of the left posterior cerebral artery which appears  patent. Possible stenosis in the posterior cerebral artery bilaterally.  Atherosclerotic irregularity in the cavernous carotid bilaterally which is inadequately evaluated in the  study. The anterior and middle cerebral arteries are not adequately evaluated due to patient positioning and motion.  IMPRESSION: Atrophy and chronic microvascular ischemia.  No acute infarct.  Sinusitis.  Limited MRA of the brain due to motion and patient positioning. Repeat study may be necessary.   Electronically Signed   By: Marlan Palau M.D.   On: 09/27/2013 19:20   Mr Maxine Glenn Head/brain Wo Cm  09/27/2013   CLINICAL DATA:  Loss of consciousness.  Garbled speech.  EXAM: MRI HEAD WITHOUT CONTRAST  MRA HEAD WITHOUT CONTRAST  TECHNIQUE: Multiplanar, multiecho pulse sequences of the brain and surrounding structures were obtained without intravenous contrast. Angiographic images of the head were obtained using MRA technique without contrast.  COMPARISON:  CT 09/26/2013  FINDINGS: MRI HEAD FINDINGS  Image quality degraded by motion. There is moderate atrophy. Chronic microvascular ischemic changes are present in the white matter. Chronic microvascular ischemic change in the pons. Small chronic infarcts in the cerebellum bilaterally.  Negative for acute infarct. Chronic micro hemorrhage right parietal periventricular white matter. No mass or edema in the brain.  Mucosal edema in the maxillary sinuses bilaterally with small air-fluid levels. Mucosal edema in the ethmoid sinuses bilaterally.  MRA HEAD FINDINGS  Image quality limited by patient motion and patient positioning. There is suboptimal signal in the circle Willis due to patient positioning.  Both vertebral arteries are patent. Mild stenosis distal right vertebral artery. The basilar and posterior cerebral arteries are patent. There is tortuosity of the left posterior cerebral artery which appears patent. Possible stenosis in the posterior cerebral artery bilaterally.  Atherosclerotic  irregularity in the cavernous carotid bilaterally which is inadequately evaluated in the study. The anterior and middle cerebral arteries are not adequately evaluated due to patient positioning and motion.  IMPRESSION: Atrophy and chronic microvascular ischemia.  No acute infarct.  Sinusitis.  Limited MRA of the brain due to motion and patient positioning. Repeat study may be necessary.   Electronically Signed   By: Marlan Palau M.D.   On: 09/27/2013 19:20   A1c--6.0 LDL--102  2 D echo Study Conclusions  - Left ventricle: The cavity size was normal. There was moderate concentric hypertrophy. Systolic function was normal. The estimated ejection fraction was in the range of 60% to 65%. Wall motion was normal; there were no regional wall motion abnormalities. Doppler parameters are consistent with abnormal left ventricular relaxation (grade 1 diastolic dysfunction). - Atrial septum: No defect or patent foramen ovale was identified.  Carotid doppler Summary:  - The vertebral arteries appear patent with antegrade flow. - Findings consistent with 1-39 percent stenosis involving the right internal carotid artery and the left internal carotid artery.  Orthostatic BP have been inconsistent.   EEG: This is a normal EEG recording during wakefulness and during drowsiness. No evidence of an epileptic disorder was demonstrated.    MEDICATIONS  Scheduled: . aspirin  325 mg Oral Daily  . cetaphil  1 application Topical BID  . citalopram  10 mg Oral Daily  . heparin  5,000 Units Subcutaneous Q8H  . lactose free nutrition  237 mL Oral BID BM  . levothyroxine  150 mcg Oral QAC breakfast    ASSESSMENT/PLAN:                                                                                                            79 y.o. female presenting after a syncopal episode that was  preceded by facial droop and difficulty with speech. Patient currently back to baseline. MRI brain showed no acute infarct and MRA was poor quality due to movement but did not show any large vessel stenosis.  Echo and Carotid dopplers WNL.  A1c elevated at 6.0 and LDL slightly elevated at 102. EEG showed no epileptiform activity. Orthostatic BP have been inconsistent. At this time cannot fully exclude [name], however [name] work up is negative.     Recommend: 1) low dose statin for LDL >100, and secondary stroke prevention 2) Continue ASA 3) Continue telemetry and orthostatic BP while in hospital.  4) Consider Holter monitor as outpatient and follow up with cardiology  No further neurology diagnotic testing recommended at this time.   Assessment and plan discussed with with attending physician and they are in agreement.    Felicie Morn PA-C [facility] [phone]  09/28/2013, 10:50 AM

## 2013-11-27 ENCOUNTER — Ambulatory Visit: Payer: Medicare Other | Admitting: Podiatry

## 2013-12-11 ENCOUNTER — Ambulatory Visit (INDEPENDENT_AMBULATORY_CARE_PROVIDER_SITE_OTHER): Payer: Medicare Other | Admitting: Podiatry

## 2013-12-11 VITALS — BP 136/70 | HR 70 | Resp 16 | Ht 62.0 in | Wt 142.8 lb

## 2013-12-11 DIAGNOSIS — M79609 Pain in unspecified limb: Secondary | ICD-10-CM

## 2013-12-11 DIAGNOSIS — B351 Tinea unguium: Secondary | ICD-10-CM

## 2013-12-11 NOTE — Progress Notes (Signed)
She presents today with a chief complaint of painful elongated toenails bilateral.  Objective: Vital signs are stable alert oriented x3. Pulses are palpable bilateral. Neurologic sensorium is intact bilateral. Nails are thick yellow dystrophic clinically mycotic bilateral.  Assessment: Pain in limb secondary to onychomycosis 1 through 5 bilateral.  Plan: Debridement of nails 1 through 5 bilateral is cover service secondary to pain.

## 2014-02-28 ENCOUNTER — Ambulatory Visit (INDEPENDENT_AMBULATORY_CARE_PROVIDER_SITE_OTHER): Payer: Medicare Other | Admitting: Podiatry

## 2014-02-28 ENCOUNTER — Ambulatory Visit: Payer: Medicare Other | Admitting: Podiatry

## 2014-02-28 VITALS — BP 124/71 | HR 67 | Resp 20

## 2014-02-28 DIAGNOSIS — B351 Tinea unguium: Secondary | ICD-10-CM

## 2014-02-28 DIAGNOSIS — M79609 Pain in unspecified limb: Secondary | ICD-10-CM

## 2014-02-28 NOTE — Progress Notes (Signed)
She presents today with a chief complaint of painful elongated toenails one through 5 bilateral.  Objective: Pulses are palpable bilateral. Nails are thick yellow dystrophic with mycotic and painful palpation.  Assessment: Pain in limb secondary to onychomycosis 1 through 5 bilateral.  Plan: Debridement of nails thickness and length as cover service secondary to pain.

## 2014-05-30 ENCOUNTER — Ambulatory Visit: Payer: Medicare Other | Admitting: Podiatry

## 2014-06-04 ENCOUNTER — Ambulatory Visit: Payer: Medicare Other | Admitting: Podiatry

## 2014-11-12 ENCOUNTER — Ambulatory Visit: Payer: Medicare Other

## 2014-11-19 ENCOUNTER — Ambulatory Visit (INDEPENDENT_AMBULATORY_CARE_PROVIDER_SITE_OTHER): Payer: Medicare Other | Admitting: Podiatry

## 2014-11-19 DIAGNOSIS — M79676 Pain in unspecified toe(s): Secondary | ICD-10-CM

## 2014-11-19 DIAGNOSIS — B351 Tinea unguium: Secondary | ICD-10-CM

## 2014-11-19 NOTE — Progress Notes (Signed)
She presents today with a chief complaint of painful elongated toenails one through 5 bilateral.  Objective: Pulses are palpable bilateral. Nails are thick yellow dystrophic with mycotic and painful palpation.  Assessment: Pain in limb secondary to onychomycosis 1 through 5 bilateral.  Plan: Debridement of nails thickness and length as cover service secondary to pain.           Great nails especially thick.

## 2015-02-07 ENCOUNTER — Other Ambulatory Visit: Payer: Self-pay | Admitting: Internal Medicine

## 2015-02-07 ENCOUNTER — Ambulatory Visit
Admission: RE | Admit: 2015-02-07 | Discharge: 2015-02-07 | Disposition: A | Discharge status: Home / Self Care | Payer: Medicare Other | Source: Ambulatory Visit | Attending: Internal Medicine | Admitting: Internal Medicine

## 2015-02-07 DIAGNOSIS — M25512 Pain in left shoulder: Secondary | ICD-10-CM

## 2015-02-18 ENCOUNTER — Ambulatory Visit: Payer: Medicare Other

## 2015-07-03 ENCOUNTER — Encounter: Payer: Self-pay | Admitting: Podiatry

## 2015-07-03 ENCOUNTER — Ambulatory Visit (INDEPENDENT_AMBULATORY_CARE_PROVIDER_SITE_OTHER): Payer: Medicare Other | Admitting: Podiatry

## 2015-07-03 DIAGNOSIS — B351 Tinea unguium: Secondary | ICD-10-CM

## 2015-07-03 DIAGNOSIS — M79676 Pain in unspecified toe(s): Secondary | ICD-10-CM

## 2015-07-03 NOTE — Progress Notes (Signed)
She presents today with a chief complaint of painful elongated toenails. He states that her dry skin seems like it wants to start cracking.  Objective: Vital signs are stable she is alert and oriented 3 pulses are strongly palpable bilaterally no acute distress. Her toenails are long thick yellow dystrophic clinically mycotic. She does demonstrate dry xerotic skin which appears to be relatively supple no open lesions and no fissures.  Assessment: Pain in limb secondary to onychomycosis.  Plan: Debridement of nails of thickness and length is covered service secondary to pain. Follow-up with her in 3 months.  Roselind Messier DPM

## 2015-09-30 ENCOUNTER — Ambulatory Visit: Payer: Medicare Other | Admitting: Podiatry

## 2016-01-08 ENCOUNTER — Ambulatory Visit
Admission: RE | Admit: 2016-01-08 | Discharge: 2016-01-08 | Disposition: A | Discharge status: Home / Self Care | Payer: Medicare Other | Source: Ambulatory Visit | Attending: Internal Medicine | Admitting: Internal Medicine

## 2016-01-08 ENCOUNTER — Other Ambulatory Visit: Payer: Self-pay | Admitting: Internal Medicine

## 2016-01-08 DIAGNOSIS — M7989 Other specified soft tissue disorders: Secondary | ICD-10-CM

## 2016-01-08 DIAGNOSIS — M79604 Pain in right leg: Secondary | ICD-10-CM

## 2016-04-08 ENCOUNTER — Ambulatory Visit: Payer: Medicare Other | Admitting: Podiatry

## 2016-04-13 ENCOUNTER — Ambulatory Visit (INDEPENDENT_AMBULATORY_CARE_PROVIDER_SITE_OTHER): Payer: Medicare Other | Admitting: Podiatry

## 2016-04-13 ENCOUNTER — Encounter: Payer: Self-pay | Admitting: Podiatry

## 2016-04-13 DIAGNOSIS — B351 Tinea unguium: Secondary | ICD-10-CM | POA: Diagnosis not present

## 2016-04-13 DIAGNOSIS — M79676 Pain in unspecified toe(s): Secondary | ICD-10-CM

## 2016-04-13 NOTE — Progress Notes (Signed)
She presents today with chief complaint of painful elongated toenails.  Objective: Pulses remain palpable. Thick yellow dystrophic Lee severely elongated sharp incurvated nail margins tibiofibular borders of the toes bilaterally.  Assessment: Pain and limp secondary onychomycosis and ingrown nails.  Plan: Debridement of toenails 1 through 5 bilateral. Follow up with her in 2-3 months.

## 2016-04-30 DIAGNOSIS — Z7901 Long term (current) use of anticoagulants: Secondary | ICD-10-CM | POA: Diagnosis not present

## 2016-05-04 DIAGNOSIS — H40023 Open angle with borderline findings, high risk, bilateral: Secondary | ICD-10-CM | POA: Diagnosis not present

## 2016-05-04 DIAGNOSIS — H2513 Age-related nuclear cataract, bilateral: Secondary | ICD-10-CM | POA: Diagnosis not present

## 2016-05-04 DIAGNOSIS — H43811 Vitreous degeneration, right eye: Secondary | ICD-10-CM | POA: Diagnosis not present

## 2016-05-04 DIAGNOSIS — H25013 Cortical age-related cataract, bilateral: Secondary | ICD-10-CM | POA: Diagnosis not present

## 2016-05-28 DIAGNOSIS — Z7901 Long term (current) use of anticoagulants: Secondary | ICD-10-CM | POA: Diagnosis not present

## 2016-06-26 DIAGNOSIS — Z7901 Long term (current) use of anticoagulants: Secondary | ICD-10-CM | POA: Diagnosis not present

## 2016-07-06 ENCOUNTER — Encounter: Payer: Self-pay | Admitting: Podiatry

## 2016-07-06 ENCOUNTER — Ambulatory Visit (INDEPENDENT_AMBULATORY_CARE_PROVIDER_SITE_OTHER): Payer: BC Managed Care – PPO | Admitting: Podiatry

## 2016-07-06 DIAGNOSIS — B351 Tinea unguium: Secondary | ICD-10-CM | POA: Diagnosis not present

## 2016-07-06 DIAGNOSIS — M79676 Pain in unspecified toe(s): Secondary | ICD-10-CM | POA: Diagnosis not present

## 2016-07-06 NOTE — Progress Notes (Signed)
She presents today chief complaint of painful elongated toenails.  Objective: Vital signs are stable alert and 3 toenails are thick yellow dystrophic, mycotic and painful palpation.  Assessment: Pain and limp secondary to onychomycosis 1 through 5 bilateral.  Plan: Debridement of nails 1 through 5 bilateral.

## 2016-07-23 DIAGNOSIS — Z7901 Long term (current) use of anticoagulants: Secondary | ICD-10-CM | POA: Diagnosis not present

## 2016-08-20 DIAGNOSIS — Z7901 Long term (current) use of anticoagulants: Secondary | ICD-10-CM | POA: Diagnosis not present

## 2016-09-17 DIAGNOSIS — Z7901 Long term (current) use of anticoagulants: Secondary | ICD-10-CM | POA: Diagnosis not present

## 2016-09-17 DIAGNOSIS — Z23 Encounter for immunization: Secondary | ICD-10-CM | POA: Diagnosis not present

## 2016-09-29 DIAGNOSIS — Z79899 Other long term (current) drug therapy: Secondary | ICD-10-CM | POA: Diagnosis not present

## 2016-09-29 DIAGNOSIS — I82501 Chronic embolism and thrombosis of unspecified deep veins of right lower extremity: Secondary | ICD-10-CM | POA: Diagnosis not present

## 2016-09-29 DIAGNOSIS — E039 Hypothyroidism, unspecified: Secondary | ICD-10-CM | POA: Diagnosis not present

## 2016-09-29 DIAGNOSIS — I10 Essential (primary) hypertension: Secondary | ICD-10-CM | POA: Diagnosis not present

## 2016-09-29 DIAGNOSIS — Z7901 Long term (current) use of anticoagulants: Secondary | ICD-10-CM | POA: Diagnosis not present

## 2017-01-11 DIAGNOSIS — E038 Other specified hypothyroidism: Secondary | ICD-10-CM | POA: Diagnosis not present

## 2017-04-05 ENCOUNTER — Ambulatory Visit (INDEPENDENT_AMBULATORY_CARE_PROVIDER_SITE_OTHER): Payer: Medicare Other | Admitting: Podiatry

## 2017-04-05 DIAGNOSIS — B351 Tinea unguium: Secondary | ICD-10-CM

## 2017-04-05 DIAGNOSIS — M79676 Pain in unspecified toe(s): Secondary | ICD-10-CM | POA: Diagnosis not present

## 2017-04-05 NOTE — Progress Notes (Signed)
Complaint:  Visit Type: Patient returns to my office for continued preventative foot care services. Complaint: Patient states" my nails have grown long and thick and become painful to walk and wear shoes" . The patient presents for preventative foot care services. No changes to ROS  Podiatric Exam: Vascular: dorsalis pedis and posterior tibial pulses are palpable bilateral. Capillary return is immediate. Temperature gradient is WNL. Skin turgor WNL  Sensorium: Normal Semmes Weinstein monofilament test. Normal tactile sensation bilaterally. Nail Exam: Pt has thick disfigured discolored nails with subungual debris noted bilateral entire nail hallux through fifth toenails Ulcer Exam: There is no evidence of ulcer or pre-ulcerative changes or infection. Orthopedic Exam: Muscle tone and strength are WNL. No limitations in general ROM. No crepitus or effusions noted. Foot type and digits show no abnormalities. Bony prominences are unremarkable. Skin: No Porokeratosis. No infection or ulcers  Diagnosis:  Onychomycosis, , Pain in right toe, pain in left toes  Treatment & Plan Procedures and Treatment: Consent by patient was obtained for treatment procedures. The patient understood the discussion of treatment and procedures well. All questions were answered thoroughly reviewed. Debridement of mycotic and hypertrophic toenails, 1 through 5 bilateral and clearing of subungual debris. No ulceration, no infection noted.  Return Visit-Office Procedure: Patient instructed to return to the office for a follow up visit 3 months   for continued evaluation and treatment.    Taj Nevins DPM 

## 2017-04-22 ENCOUNTER — Encounter: Payer: Self-pay | Admitting: Neurology

## 2017-06-15 ENCOUNTER — Ambulatory Visit: Payer: Medicare Other | Admitting: Neurology

## 2017-07-05 ENCOUNTER — Ambulatory Visit: Payer: Medicare Other | Admitting: Podiatry

## 2017-07-12 ENCOUNTER — Ambulatory Visit: Payer: Medicare Other | Admitting: Podiatry

## 2017-07-22 ENCOUNTER — Ambulatory Visit: Payer: Medicare Other | Admitting: Podiatry

## 2017-08-05 ENCOUNTER — Ambulatory Visit: Payer: Medicare Other | Admitting: Podiatry

## 2017-08-09 ENCOUNTER — Ambulatory Visit (INDEPENDENT_AMBULATORY_CARE_PROVIDER_SITE_OTHER): Payer: Medicare Other | Admitting: Podiatry

## 2017-08-09 DIAGNOSIS — M79676 Pain in unspecified toe(s): Secondary | ICD-10-CM

## 2017-08-09 DIAGNOSIS — B351 Tinea unguium: Secondary | ICD-10-CM | POA: Diagnosis not present

## 2017-08-09 NOTE — Progress Notes (Signed)
Complaint:  Visit Type: Patient returns to my office for continued preventative foot care services. Complaint: Patient states" my nails have grown long and thick and become painful to walk and wear shoes"  The patient presents for preventative foot care services. No changes to ROS  Podiatric Exam: Vascular: dorsalis pedis and posterior tibial pulses are palpable bilateral. Capillary return is immediate. Temperature gradient is WNL. Skin turgor WNL  Sensorium: Normal Semmes Weinstein monofilament test. Normal tactile sensation bilaterally. Nail Exam: Pt has thick disfigured discolored nails with subungual debris noted bilateral entire nail hallux through fifth toenails Ulcer Exam: There is no evidence of ulcer or pre-ulcerative changes or infection. Orthopedic Exam: Muscle tone and strength are WNL. No limitations in general ROM. No crepitus or effusions noted. Foot type and digits show no abnormalities.  HAV  B/L with hammer toes second  B/L Skin: No Porokeratosis. No infection or ulcers  Diagnosis:  Onychomycosis, , Pain in right toe, pain in left toes  Treatment & Plan Procedures and Treatment: Consent by patient was obtained for treatment procedures. The patient understood the discussion of treatment and procedures well. All questions were answered thoroughly reviewed. Debridement of mycotic and hypertrophic toenails, 1 through 5 bilateral and clearing of subungual debris. No ulceration, no infection noted.  Return Visit-Office Procedure: Patient instructed to return to the office for a follow up visit 3 months for continued evaluation and treatment.    Gardiner Barefoot DPM

## 2017-08-16 ENCOUNTER — Ambulatory Visit: Payer: Medicare Other | Admitting: Neurology

## 2017-08-23 ENCOUNTER — Encounter: Payer: Medicare Other | Admitting: Hematology

## 2017-08-23 ENCOUNTER — Telehealth: Payer: Self-pay

## 2017-08-23 NOTE — Telephone Encounter (Signed)
Spoke with pt daughter, who was unaware of pt appointment. Message sent to Isaiah Blakes for pt communication to reschedule.

## 2017-09-04 ENCOUNTER — Emergency Department (HOSPITAL_COMMUNITY): Payer: Medicare Other

## 2017-09-04 ENCOUNTER — Other Ambulatory Visit: Payer: Self-pay

## 2017-09-04 ENCOUNTER — Encounter (HOSPITAL_COMMUNITY): Payer: Self-pay | Admitting: Emergency Medicine

## 2017-09-04 ENCOUNTER — Observation Stay (HOSPITAL_COMMUNITY)
Admission: EM | Admit: 2017-09-04 | Discharge: 2017-09-07 | Disposition: A | Discharge status: Home / Self Care | Payer: Medicare Other | Attending: Internal Medicine | Admitting: Internal Medicine

## 2017-09-04 DIAGNOSIS — I252 Old myocardial infarction: Secondary | ICD-10-CM | POA: Diagnosis not present

## 2017-09-04 DIAGNOSIS — Z79899 Other long term (current) drug therapy: Secondary | ICD-10-CM | POA: Insufficient documentation

## 2017-09-04 DIAGNOSIS — R0902 Hypoxemia: Secondary | ICD-10-CM | POA: Insufficient documentation

## 2017-09-04 DIAGNOSIS — E039 Hypothyroidism, unspecified: Secondary | ICD-10-CM | POA: Insufficient documentation

## 2017-09-04 DIAGNOSIS — Z7982 Long term (current) use of aspirin: Secondary | ICD-10-CM | POA: Insufficient documentation

## 2017-09-04 DIAGNOSIS — R2681 Unsteadiness on feet: Secondary | ICD-10-CM | POA: Insufficient documentation

## 2017-09-04 DIAGNOSIS — I700 Atherosclerosis of aorta: Secondary | ICD-10-CM | POA: Insufficient documentation

## 2017-09-04 DIAGNOSIS — F32A Depression, unspecified: Secondary | ICD-10-CM | POA: Diagnosis present

## 2017-09-04 DIAGNOSIS — R7989 Other specified abnormal findings of blood chemistry: Secondary | ICD-10-CM | POA: Insufficient documentation

## 2017-09-04 DIAGNOSIS — E876 Hypokalemia: Secondary | ICD-10-CM | POA: Insufficient documentation

## 2017-09-04 DIAGNOSIS — D649 Anemia, unspecified: Secondary | ICD-10-CM | POA: Insufficient documentation

## 2017-09-04 DIAGNOSIS — N179 Acute kidney failure, unspecified: Secondary | ICD-10-CM

## 2017-09-04 DIAGNOSIS — I10 Essential (primary) hypertension: Secondary | ICD-10-CM | POA: Diagnosis present

## 2017-09-04 DIAGNOSIS — F329 Major depressive disorder, single episode, unspecified: Secondary | ICD-10-CM | POA: Insufficient documentation

## 2017-09-04 DIAGNOSIS — R55 Syncope and collapse: Principal | ICD-10-CM | POA: Insufficient documentation

## 2017-09-04 DIAGNOSIS — E860 Dehydration: Secondary | ICD-10-CM | POA: Diagnosis not present

## 2017-09-04 LAB — RETICULOCYTES
RBC.: 2.45 MIL/uL — ABNORMAL LOW (ref 3.87–5.11)
Retic Count, Absolute: 39.2 10*3/uL (ref 19.0–186.0)
Retic Ct Pct: 1.6 % (ref 0.4–3.1)

## 2017-09-04 LAB — URINALYSIS, ROUTINE W REFLEX MICROSCOPIC
Bilirubin Urine: NEGATIVE
GLUCOSE, UA: NEGATIVE mg/dL
HGB URINE DIPSTICK: NEGATIVE
KETONES UR: NEGATIVE mg/dL
Leukocytes, UA: NEGATIVE
Nitrite: NEGATIVE
PH: 6 (ref 5.0–8.0)
PROTEIN: NEGATIVE mg/dL
Specific Gravity, Urine: 1.011 (ref 1.005–1.030)

## 2017-09-04 LAB — I-STAT TROPONIN, ED: TROPONIN I, POC: 0.14 ng/mL — AB (ref 0.00–0.08)

## 2017-09-04 LAB — COMPREHENSIVE METABOLIC PANEL
ALT: 7 U/L — ABNORMAL LOW (ref 14–54)
AST: 23 U/L (ref 15–41)
Albumin: 3.4 g/dL — ABNORMAL LOW (ref 3.5–5.0)
Alkaline Phosphatase: 48 U/L (ref 38–126)
Anion gap: 6 (ref 5–15)
BILIRUBIN TOTAL: 0.8 mg/dL (ref 0.3–1.2)
BUN: 22 mg/dL — AB (ref 6–20)
CHLORIDE: 105 mmol/L (ref 101–111)
CO2: 27 mmol/L (ref 22–32)
Calcium: 8.7 mg/dL — ABNORMAL LOW (ref 8.9–10.3)
Creatinine, Ser: 1.18 mg/dL — ABNORMAL HIGH (ref 0.44–1.00)
GFR, EST AFRICAN AMERICAN: 43 mL/min — AB (ref >60)
GFR, EST NON AFRICAN AMERICAN: 37 mL/min — AB (ref >60)
Glucose, Bld: 88 mg/dL (ref 65–99)
POTASSIUM: 3.5 mmol/L (ref 3.5–5.1)
Sodium: 138 mmol/L (ref 135–145)
TOTAL PROTEIN: 7.3 g/dL (ref 6.5–8.1)

## 2017-09-04 LAB — CBC WITH DIFFERENTIAL/PLATELET
BASOS ABS: 0 10*3/uL (ref 0.0–0.1)
Basophils Relative: 0 %
EOS PCT: 0 %
Eosinophils Absolute: 0 10*3/uL (ref 0.0–0.7)
HEMATOCRIT: 24.4 % — AB (ref 36.0–46.0)
Hemoglobin: 8 g/dL — ABNORMAL LOW (ref 12.0–15.0)
LYMPHS ABS: 1.2 10*3/uL (ref 0.7–4.0)
LYMPHS PCT: 32 %
MCH: 30.9 pg (ref 26.0–34.0)
MCHC: 32.8 g/dL (ref 30.0–36.0)
MCV: 94.2 fL (ref 78.0–100.0)
MONO ABS: 0.2 10*3/uL (ref 0.1–1.0)
MONOS PCT: 6 %
NEUTROS ABS: 2.3 10*3/uL (ref 1.7–7.7)
Neutrophils Relative %: 62 %
PLATELETS: 209 10*3/uL (ref 150–400)
RBC: 2.59 MIL/uL — ABNORMAL LOW (ref 3.87–5.11)
RDW: 15.7 % — AB (ref 11.5–15.5)
WBC: 3.7 10*3/uL — ABNORMAL LOW (ref 4.0–10.5)

## 2017-09-04 LAB — TROPONIN I

## 2017-09-04 LAB — BASIC METABOLIC PANEL
Anion gap: 9 (ref 5–15)
BUN: 26 mg/dL — ABNORMAL HIGH (ref 6–20)
CHLORIDE: 107 mmol/L (ref 101–111)
CO2: 21 mmol/L — ABNORMAL LOW (ref 22–32)
CREATININE: 1.24 mg/dL — AB (ref 0.44–1.00)
Calcium: 8.4 mg/dL — ABNORMAL LOW (ref 8.9–10.3)
GFR calc Af Amer: 41 mL/min — ABNORMAL LOW (ref >60)
GFR calc non Af Amer: 35 mL/min — ABNORMAL LOW (ref >60)
GLUCOSE: 108 mg/dL — AB (ref 65–99)
Potassium: 3 mmol/L — ABNORMAL LOW (ref 3.5–5.1)
SODIUM: 137 mmol/L (ref 135–145)

## 2017-09-04 LAB — CBC
HCT: 24.3 % — ABNORMAL LOW (ref 36.0–46.0)
Hemoglobin: 7.9 g/dL — ABNORMAL LOW (ref 12.0–15.0)
MCH: 31.1 pg (ref 26.0–34.0)
MCHC: 32.5 g/dL (ref 30.0–36.0)
MCV: 95.7 fL (ref 78.0–100.0)
PLATELETS: 188 10*3/uL (ref 150–400)
RBC: 2.54 MIL/uL — ABNORMAL LOW (ref 3.87–5.11)
RDW: 15.8 % — ABNORMAL HIGH (ref 11.5–15.5)
WBC: 2.6 10*3/uL — ABNORMAL LOW (ref 4.0–10.5)

## 2017-09-04 LAB — I-STAT CG4 LACTIC ACID, ED: LACTIC ACID, VENOUS: 3 mmol/L — AB (ref 0.5–1.9)

## 2017-09-04 LAB — MAGNESIUM: MAGNESIUM: 1.9 mg/dL (ref 1.7–2.4)

## 2017-09-04 LAB — VITAMIN B12: Vitamin B-12: 470 pg/mL (ref 180–914)

## 2017-09-04 LAB — PROTIME-INR
INR: 1.09
Prothrombin Time: 14 seconds (ref 11.4–15.2)

## 2017-09-04 MED ORDER — CITALOPRAM HYDROBROMIDE 10 MG PO TABS
5.0000 mg | ORAL_TABLET | Freq: Every day | ORAL | Status: DC
  Administered 2017-09-04 – 2017-09-06 (×3): 5 mg via ORAL
  Filled 2017-09-04 (×3): qty 1

## 2017-09-04 MED ORDER — ASPIRIN EC 81 MG PO TBEC
81.0000 mg | DELAYED_RELEASE_TABLET | Freq: Every day | ORAL | Status: DC
  Administered 2017-09-04 – 2017-09-05 (×2): 81 mg via ORAL
  Filled 2017-09-04 (×3): qty 1

## 2017-09-04 MED ORDER — ENSURE ENLIVE PO LIQD
237.0000 mL | Freq: Two times a day (BID) | ORAL | Status: DC
  Administered 2017-09-05 – 2017-09-07 (×4): 237 mL via ORAL
  Filled 2017-09-04 (×8): qty 237

## 2017-09-04 MED ORDER — SODIUM CHLORIDE 0.9 % IV SOLN
INTRAVENOUS | Status: DC
  Administered 2017-09-04 – 2017-09-06 (×3): via INTRAVENOUS

## 2017-09-04 MED ORDER — SODIUM CHLORIDE 0.9% FLUSH
3.0000 mL | Freq: Two times a day (BID) | INTRAVENOUS | Status: DC
  Administered 2017-09-04 – 2017-09-07 (×4): 3 mL via INTRAVENOUS

## 2017-09-04 MED ORDER — POTASSIUM CHLORIDE CRYS ER 20 MEQ PO TBCR
40.0000 meq | EXTENDED_RELEASE_TABLET | Freq: Once | ORAL | Status: AC
  Administered 2017-09-04: 40 meq via ORAL
  Filled 2017-09-04: qty 2

## 2017-09-04 MED ORDER — LEVOTHYROXINE SODIUM 50 MCG PO TABS
50.0000 ug | ORAL_TABLET | Freq: Every day | ORAL | Status: DC
  Administered 2017-09-05 – 2017-09-07 (×3): 50 ug via ORAL
  Filled 2017-09-04 (×3): qty 1

## 2017-09-04 MED ORDER — ENOXAPARIN SODIUM 40 MG/0.4ML ~~LOC~~ SOLN
40.0000 mg | SUBCUTANEOUS | Status: DC
  Administered 2017-09-05: 40 mg via SUBCUTANEOUS
  Filled 2017-09-04: qty 0.4

## 2017-09-04 NOTE — ED Notes (Signed)
Pt back in room.

## 2017-09-04 NOTE — ED Provider Notes (Signed)
[facility] Provider Note   CSN: [id number] Arrival date & time: 09/04/17  1524     History   Chief Complaint Chief Complaint  Patient presents with  . Weakness  . Near Syncope    HPI Wendy [name] is a 83 y.o. female.  HPI   Patient is a 83 year old female presenting with an episode of confusion and altered mental status at home.  Patient's daughter called EMS because she was confused.  When EMS came they found her hypoxic, hypotensive.  Her blood pressure was 80/40 and she was hypoxic to 85.  However they gave her fluids, oxygen.  Now she has a completely normal blood pressure, is 100% on room air and is smiling chatty and alert.  Patient has no complaints.  She reports" I do not know why I came in, I do not feel sick all".  Patient is alert oriented to herself.  Past Medical History:  Diagnosis Date  . Hypertension   . Thyroid disease     Patient Active Problem List   Diagnosis Date Noted  . Syncope 09/28/2013  . Altered mental status 09/26/2013  . Syncope and collapse 09/26/2013  . Hypothyroidism 09/26/2013  . Depression 09/26/2013  . HTN (hypertension) 09/26/2013    Past Surgical History:  Procedure Laterality Date  . CORONARY ANGIOPLASTY      OB History    No data available       Home Medications    Prior to Admission medications   Medication Sig Start Date End Date Taking? Authorizing Provider  aspirin EC 81 MG tablet Take 81 mg daily after supper by mouth.    Yes [provider]  citalopram (CELEXA) 10 MG tablet Take 5 mg daily after supper by mouth.  08/24/13  Yes [provider]  ENSURE (ENSURE) Take 237 mLs by mouth 2 (two) times daily between meals.   Yes [provider]  levothyroxine (SYNTHROID, LEVOTHROID) 50 MCG tablet Take 50 mcg daily before breakfast by mouth.    Yes [provider]  losartan-hydrochlorothiazide (HYZAAR) 100-25 MG per tablet Take 0.5 tablets by  mouth daily. Patient taking differently: Take 1 tablet daily after supper by mouth.  09/28/13  Yes Regalado, Belkys A, MD  potassium chloride (KLOR-CON) 8 MEQ tablet Take 8 mEq daily by mouth.   Yes [provider]  triamcinolone (KENALOG) 0.025 % cream Apply 1 application 2 (two) times daily as needed topically (rash).   Yes [provider]  Vitamin D, Ergocalciferol, (DRISDOL) 50000 units CAPS capsule Take 50,000 Units every 7 (seven) days by mouth.   Yes [provider]  guaiFENesin (MUCINEX) 600 MG 12 hr tablet Take 1 tablet (600 mg total) by mouth 2 (two) times daily as needed for cough. Patient not taking: Reported on 09/04/2017 09/28/13   Niel Hummer A, MD  hydrocortisone cream 1 % Apply to affected area 2 times daily Patient not taking: Reported on 09/04/2017 12/30/12   Veatrice Kells, MD    Family History History reviewed. No pertinent family history.  Social History Social History   Tobacco Use  . Smoking status: Never Smoker  Substance Use Topics  . Alcohol use: No  . Drug use: No     Allergies   Patient has no known allergies.   Review of Systems Review of Systems  Unable to perform ROS: Dementia  Constitutional: Negative for activity change.  Respiratory: Negative for shortness of breath.   Cardiovascular: Negative for chest pain.  Gastrointestinal: Negative for abdominal pain.     Physical Exam Updated Vital Signs BP 135/71   Pulse 73   Temp 98.8 F (37.1 C) (Oral)   Resp 15   Ht 5\' 2"  (1.575 m)   Wt 64.4 kg (142 lb)   SpO2 100%   BMI 25.97 kg/m   Physical Exam  Constitutional: She appears well-developed and well-nourished.  HENT:  Head: Normocephalic and atraumatic.  Eyes: Right eye exhibits no discharge. Left eye exhibits no discharge.  Cardiovascular: Normal rate and regular rhythm.  No murmur heard. Pulmonary/Chest: Effort normal and breath sounds normal. No stridor. No respiratory distress.  Neurological:    Oriented to self, and hospital.  Skin: Skin is warm and dry. She is not diaphoretic.  Psychiatric: She has a normal mood and affect. Her behavior is normal.  Nursing note and vitals reviewed.    ED Treatments / Results  Labs (all labs ordered are listed, but only abnormal results are displayed) Labs Reviewed  CBC - Abnormal; Notable for the following components:      Result Value   WBC 2.6 (*)    RBC 2.54 (*)    Hemoglobin 7.9 (*)    HCT 24.3 (*)    RDW 15.8 (*)    All other components within normal limits  I-STAT TROPONIN, ED - Abnormal; Notable for the following components:   Troponin i, poc 0.14 (*)    All other components within normal limits  I-STAT CG4 LACTIC ACID, ED - Abnormal; Notable for the following components:   Lactic Acid, Venous 3.00 (*)    All other components within normal limits  BASIC METABOLIC PANEL  URINALYSIS, ROUTINE W REFLEX MICROSCOPIC  CBC WITH DIFFERENTIAL/PLATELET  COMPREHENSIVE METABOLIC PANEL  PROTIME-INR  CBG MONITORING, ED    EKG  EKG Interpretation  Date/Time:  Saturday September 04 2017 15:27:04 EST Ventricular Rate:  74 PR Interval:    QRS Duration: 153 QT Interval:  457 QTC Calculation: 508 R Axis:   -13 Text Interpretation:  No significant change since last tracing Confirmed by Zenovia Jarred [phone]) on 09/04/2017 4:09:32 PM       Radiology Dg Chest 2 View  Result Date: 09/04/2017 CLINICAL DATA:  Confusion and altered mental status. EXAM: CHEST  2 VIEW COMPARISON:  Chest x-ray dated 09/26/2013. FINDINGS: Stable cardiomegaly. Atherosclerotic changes of the ectatic thoracic aorta. Study is slightly hypoinspiratory. Given the low lung volumes, lungs appear clear. No pleural effusion or pneumothorax seen. No acute or suspicious osseous finding. IMPRESSION: No active cardiopulmonary disease. No evidence of pneumonia or pulmonary edema. Aortic atherosclerosis. Electronically Signed   By: Franki Cabot M.D.   On: 09/04/2017 16:47    Ct Head Wo Contrast  Result Date: 09/04/2017 CLINICAL DATA:  Confusion and altered mental status. Hypoxia and hypotension. EXAM: CT HEAD WITHOUT CONTRAST TECHNIQUE: Contiguous axial images were obtained from the base of the skull through the vertex without intravenous contrast. COMPARISON:  Head CT dated 09/26/2013 and brain MRI dated 09/27/2013. FINDINGS: Brain: There is mild generalized age related parenchymal atrophy with commensurate dilatation of the ventricles and sulci. Mild chronic small vessel ischemic changes are again noted within the bilateral periventricular white matter regions. There is no mass, hemorrhage, edema or other evidence of acute parenchymal abnormality. No extra-axial hemorrhage. Vascular: There are chronic calcified atherosclerotic changes of the large vessels at the skull base. No unexpected hyperdense vessel. Skull: Normal. Negative for fracture or focal lesion. Sinuses/Orbits: No acute finding. Other: None. IMPRESSION: 1. No  acute findings.  No intracranial mass, hemorrhage or edema. 2. Mild chronic small vessel ischemic changes in the white matter. Electronically Signed   By: Franki Cabot M.D.   On: 09/04/2017 16:46    Procedures Procedures (including critical care time)    Medications Ordered in ED Medications - No data to display   Initial Impression / Assessment and Plan / ED Course  I have reviewed the triage vital signs and the nursing notes.  Pertinent labs & imaging results that were available during my care of the patient were reviewed by me and considered in my medical decision making (see chart for details).    Patient is an adorable 83 year old female presenting with altered mental status prior to arrival.  Everything is resolved she is normal vital signs and is alert.  Will do workup.  Awaiting daughter to get more information about the occurrence prior to arrival.   5:37 PM Daughter here. Apparently patient has had 2 events of syncope.  Today  she was eating her dinner and all of a sudden she closed her eyes started rolling the left.  Patient unable to be aroused immediately.  Patient's daughter called EMS.  By their arrival patient was hypotensive and hypoxic.  Now improved.  Similar event happened on Tuesday.  Concern today for cardiac dysrhythmia causing symptoms.  Especially given that patient has elevated troponin.  Not suspect an infection given the patient has normal vital signs.  Suspect the lactic is from tissue hypoxia secondary to cardiac dysrhythmia. Final Clinical Impressions(s) / ED Diagnoses   Final diagnoses:  None    ED Discharge Orders    None       Macarthur Critchley, MD 09/04/17 1737

## 2017-09-04 NOTE — ED Notes (Signed)
Patient transported to CT 

## 2017-09-04 NOTE — ED Triage Notes (Signed)
Per [facility] EMS, pt coming from home with complaints of sudden onset of weakness and becoming lethargic. Pt denies LOC and fall. Vitals via ems were 82/40, 54, 84%, CBG 148. Pt received 1100 cc fluid bolus and 4L of O2 via ems.

## 2017-09-04 NOTE — H&P (Signed)
[facility] History and Physical  JENNETTE LEASK FGH:[id number] DOB: [birth year] DOA: 09/04/2017  PCP: Lajean Manes, MD  Patient coming from: Home  Chief Complaint: Passing out  HPI: Wendy [name] is a 83 y.o. female with a medical history of hypertension, hypothyroidism, presented to the emergency department with complaints of altered mental status, confusion and passing out. Patient's daughter noted the patient had eaten impound her somewhat slumped over afterwards. EMS was called, upon arrival patient was found to be hypotensive and hypoxic. Blood pressure was 80/40, with oxygen level of 85. Patient was given 1 L of fluids in route to the hospital. Upon arrival she was noted to have a normal blood pressure and satting 100% on room air. Of note patient did have similar episode Tuesday of this past week. Currently she denies any chest pain, shortness breath, abdominal pain, nausea vomiting, diarrhea constipation, dizziness or headache, change in bowel pattern urinary frequency. Per patient's daughter, patient has had poor appetite and has been more tired lately. She states her mother's thyroid medication was changed recently.  ED Course: Upon arrival, patient was noted to have a normal blood pressure and heart rate. [facility] called for admission  Review of Systems:  All other systems reviewed and are negative.   Past Medical History:  Diagnosis Date  . Hypertension   . Thyroid disease     Past Surgical History:  Procedure Laterality Date  . CORONARY ANGIOPLASTY      Social History:  reports that  has never smoked. She does not have any smokeless tobacco history on file. She reports that she does not drink alcohol or use drugs.  No Known Allergies  History reviewed. Family history of hypertension.  Prior to Admission medications   Medication Sig Start Date End Date Taking? Authorizing Provider  aspirin EC 81 MG tablet Take 81 mg daily after supper by mouth.    Yes  [provider]  citalopram (CELEXA) 10 MG tablet Take 5 mg daily after supper by mouth.  08/24/13  Yes [provider]  ENSURE (ENSURE) Take 237 mLs by mouth 2 (two) times daily between meals.   Yes [provider]  levothyroxine (SYNTHROID, LEVOTHROID) 50 MCG tablet Take 50 mcg daily before breakfast by mouth.    Yes [provider]  losartan-hydrochlorothiazide (HYZAAR) 100-25 MG per tablet Take 0.5 tablets by mouth daily. Patient taking differently: Take 1 tablet daily after supper by mouth.  09/28/13  Yes Regalado, Belkys A, MD  potassium chloride (KLOR-CON) 8 MEQ tablet Take 8 mEq daily by mouth.   Yes [provider]  triamcinolone (KENALOG) 0.025 % cream Apply 1 application 2 (two) times daily as needed topically (rash).   Yes [provider]  Vitamin D, Ergocalciferol, (DRISDOL) 50000 units CAPS capsule Take 50,000 Units every 7 (seven) days by mouth.   Yes [provider]  guaiFENesin (MUCINEX) 600 MG 12 hr tablet Take 1 tablet (600 mg total) by mouth 2 (two) times daily as needed for cough. Patient not taking: Reported on 09/04/2017 09/28/13   Niel Hummer A, MD  hydrocortisone cream 1 % Apply to affected area 2 times daily Patient not taking: Reported on 09/04/2017 12/30/12   Randal Buba, April, MD    Physical Exam: Vitals:   09/04/17 1645 09/04/17 1700  BP: 120/71 135/71  Pulse: 73   Resp: 15 15  Temp:    SpO2: 99% 100%     General: Well developed, well nourished, NAD, appears stated age  76:  NCAT, PERRLA, EOMI, Anicteic Sclera, mucous membranes moist.   Neck: Supple, no JVD, no masses  Cardiovascular: S1 S2 auscultated, no rubs, murmurs or gallops. Regular rate and rhythm.  Respiratory: Clear to auscultation bilaterally with equal chest rise  Abdomen: Soft, nontender, nondistended, + bowel sounds  Extremities: warm dry without cyanosis clubbing or edema  Neuro: AAOx3, cranial nerves grossly intact.  Strength 5/5 in patient's upper and lower extremities bilaterally  Skin: Without rashes exudates or nodules  Psych: Normal affect and demeanor with intact judgement and insight  Labs on Admission: I have personally reviewed following labs and imaging studies CBC: Recent Labs  Lab 09/04/17 1533  WBC 2.6*  HGB 7.9*  HCT 24.3*  MCV 95.7  PLT 440   Basic Metabolic Panel: No results for input(s): NA, K, CL, CO2, GLUCOSE, BUN, CREATININE, CALCIUM, MG, PHOS in the last 168 hours. GFR: CrCl cannot be calculated (Patient's most recent lab result is older than the maximum 21 days allowed.). Liver Function Tests: No results for input(s): AST, ALT, ALKPHOS, BILITOT, PROT, ALBUMIN in the last 168 hours. No results for input(s): LIPASE, AMYLASE in the last 168 hours. No results for input(s): AMMONIA in the last 168 hours. Coagulation Profile: No results for input(s): INR, PROTIME in the last 168 hours. Cardiac Enzymes: No results for input(s): CKTOTAL, CKMB, CKMBINDEX, TROPONINI in the last 168 hours. BNP (last 3 results) No results for input(s): PROBNP in the last 8760 hours. HbA1C: No results for input(s): HGBA1C in the last 72 hours. CBG: No results for input(s): GLUCAP in the last 168 hours. Lipid Profile: No results for input(s): CHOL, HDL, LDLCALC, TRIG, CHOLHDL, LDLDIRECT in the last 72 hours. Thyroid Function Tests: No results for input(s): TSH, T4TOTAL, FREET4, T3FREE, THYROIDAB in the last 72 hours. Anemia Panel: No results for input(s): VITAMINB12, FOLATE, FERRITIN, TIBC, IRON, RETICCTPCT in the last 72 hours. Urine analysis:    Component Value Date/Time   COLORURINE YELLOW 09/28/2013 1136   APPEARANCEUR CLEAR 09/28/2013 1136   LABSPEC 1.015 09/28/2013 1136   PHURINE 6.0 09/28/2013 1136   GLUCOSEU NEGATIVE 09/28/2013 1136   HGBUR NEGATIVE 09/28/2013 1136   BILIRUBINUR NEGATIVE 09/28/2013 1136   KETONESUR NEGATIVE 09/28/2013 1136   PROTEINUR NEGATIVE 09/28/2013 1136    UROBILINOGEN 0.2 09/28/2013 1136   NITRITE NEGATIVE 09/28/2013 1136   LEUKOCYTESUR TRACE (A) 09/28/2013 1136   Sepsis Labs: @LABRCNTIP (procalcitonin:4,lacticidven:4) )No results found for this or any previous visit (from the past 240 hour(s)).   Radiological Exams on Admission: Dg Chest 2 View  Result Date: 09/04/2017 CLINICAL DATA:  Confusion and altered mental status. EXAM: CHEST  2 VIEW COMPARISON:  Chest x-ray dated 09/26/2013. FINDINGS: Stable cardiomegaly. Atherosclerotic changes of the ectatic thoracic aorta. Study is slightly hypoinspiratory. Given the low lung volumes, lungs appear clear. No pleural effusion or pneumothorax seen. No acute or suspicious osseous finding. IMPRESSION: No active cardiopulmonary disease. No evidence of pneumonia or pulmonary edema. Aortic atherosclerosis. Electronically Signed   By: Franki Cabot M.D.   On: 09/04/2017 16:47   Ct Head Wo Contrast  Result Date: 09/04/2017 CLINICAL DATA:  Confusion and altered mental status. Hypoxia and hypotension. EXAM: CT HEAD WITHOUT CONTRAST TECHNIQUE: Contiguous axial images were obtained from the base of the skull through the vertex without intravenous contrast. COMPARISON:  Head CT dated 09/26/2013 and brain MRI dated 09/27/2013. FINDINGS: Brain: There is mild generalized age related parenchymal atrophy with commensurate dilatation of the ventricles and sulci. Mild chronic small vessel ischemic changes are again  noted within the bilateral periventricular white matter regions. There is no mass, hemorrhage, edema or other evidence of acute parenchymal abnormality. No extra-axial hemorrhage. Vascular: There are chronic calcified atherosclerotic changes of the large vessels at the skull base. No unexpected hyperdense vessel. Skull: Normal. Negative for fracture or focal lesion. Sinuses/Orbits: No acute finding. Other: None. IMPRESSION: 1. No acute findings.  No intracranial mass, hemorrhage or edema. 2. Mild chronic small vessel  ischemic changes in the white matter. Electronically Signed   By: Franki Cabot M.D.   On: 09/04/2017 16:46    EKG: Independently reviewed. Sinus rhythm, rate 74  Assessment/Plan Syncope -Suspect cardiogenic versus vasovagal as patient was found to have blood pressure in the 82U systolically upon EMS arrival. -Patient admitted to telemetry -Will obtain orthostatic vitals, echocardiogram -UA/chest x-ray unremarkable for infection -  Elevated troponin -Likely secondary to the above, will continue to cycle -Will obtain echocardiogram  Elevated lactic acid -No source of infection, patient currently afebrile -As above, UA and chest x-ray unremarkable for infection -Likely secondary to syncope and hypovolemia, will continue to monitor  Essential hypertension -Will hold losartan/HCTZ -Continue to monitor closely  Depression -Continue Celexa  Hypothyroidism -Continue Synthroid  Normocytic anemia -Hemoglobin 7.9, possibly dilutional component as patient did receive IV fluids in route to the hospital. Labs back in 2014 show hemoglobin of 11 -Will obtain anemia panel, FOBT of possible continue monitor CBC  Possible Acute kidney Injury -creatinine upon admission 1.24 -possibly elevated due to hypotension, dehydration, vs medications -labs in 2014- basline about 0.8 -will place on gentle IVF and monitor BMP  Hypokalemia -will replace potassium and obtain magnesium level -continue to monitor BMP  Transient hypoxia -patient was hypoxic at home, no longer hypoxic -continue to monitor  DVT prophylaxis: Lovenox  Code Status: Full  Family Communication: Daughter at bedside. Admission, patients condition and plan of care including tests being ordered have been discussed with the patient and daughter who indicate understanding and agree with the plan and Code Status.  Disposition Plan: Home   Consults called: Cardiology   Admission status: Obseravation   Time spent: 60  minutes  Breyon Sigg D.O. [facility] Pager [phone]  If 7PM-7AM, please contact night-coverage [email or web address] Password [facility] 09/04/2017, 5:48 PM

## 2017-09-04 NOTE — ED Notes (Signed)
Family at bedside. 

## 2017-09-05 ENCOUNTER — Encounter (HOSPITAL_COMMUNITY): Payer: Self-pay | Admitting: *Deleted

## 2017-09-05 ENCOUNTER — Observation Stay (HOSPITAL_COMMUNITY): Payer: Medicare Other

## 2017-09-05 DIAGNOSIS — F329 Major depressive disorder, single episode, unspecified: Secondary | ICD-10-CM | POA: Diagnosis not present

## 2017-09-05 DIAGNOSIS — E039 Hypothyroidism, unspecified: Secondary | ICD-10-CM | POA: Diagnosis not present

## 2017-09-05 DIAGNOSIS — R55 Syncope and collapse: Secondary | ICD-10-CM | POA: Diagnosis not present

## 2017-09-05 DIAGNOSIS — I10 Essential (primary) hypertension: Secondary | ICD-10-CM | POA: Diagnosis not present

## 2017-09-05 LAB — COMPREHENSIVE METABOLIC PANEL
ALT: 9 U/L — ABNORMAL LOW (ref 14–54)
AST: 21 U/L (ref 15–41)
Albumin: 3 g/dL — ABNORMAL LOW (ref 3.5–5.0)
Alkaline Phosphatase: 42 U/L (ref 38–126)
Anion gap: 5 (ref 5–15)
BUN: 18 mg/dL (ref 6–20)
CHLORIDE: 109 mmol/L (ref 101–111)
CO2: 25 mmol/L (ref 22–32)
Calcium: 8.4 mg/dL — ABNORMAL LOW (ref 8.9–10.3)
Creatinine, Ser: 1.09 mg/dL — ABNORMAL HIGH (ref 0.44–1.00)
GFR, EST AFRICAN AMERICAN: 47 mL/min — AB (ref >60)
GFR, EST NON AFRICAN AMERICAN: 41 mL/min — AB (ref >60)
Glucose, Bld: 74 mg/dL (ref 65–99)
POTASSIUM: 3.4 mmol/L — AB (ref 3.5–5.1)
SODIUM: 139 mmol/L (ref 135–145)
Total Bilirubin: 1.1 mg/dL (ref 0.3–1.2)
Total Protein: 6.9 g/dL (ref 6.5–8.1)

## 2017-09-05 LAB — CBC
HEMATOCRIT: 22.1 % — AB (ref 36.0–46.0)
HEMOGLOBIN: 7.2 g/dL — AB (ref 12.0–15.0)
MCH: 30.8 pg (ref 26.0–34.0)
MCHC: 32.6 g/dL (ref 30.0–36.0)
MCV: 94.4 fL (ref 78.0–100.0)
PLATELETS: 192 10*3/uL (ref 150–400)
RBC: 2.34 MIL/uL — AB (ref 3.87–5.11)
RDW: 15.9 % — ABNORMAL HIGH (ref 11.5–15.5)
WBC: 2.8 10*3/uL — AB (ref 4.0–10.5)

## 2017-09-05 LAB — T4, FREE: FREE T4: 0.87 ng/dL (ref 0.61–1.12)

## 2017-09-05 LAB — TSH: TSH: 2.403 u[IU]/mL (ref 0.350–4.500)

## 2017-09-05 LAB — FOLATE: Folate: 19.5 ng/mL (ref >5.9)

## 2017-09-05 LAB — IRON AND TIBC
Iron: 53 ug/dL (ref 28–170)
SATURATION RATIOS: 23 % (ref 10.4–31.8)
TIBC: 234 ug/dL — AB (ref 250–450)
UIBC: 181 ug/dL

## 2017-09-05 LAB — LACTIC ACID, PLASMA: Lactic Acid, Venous: 1.2 mmol/L (ref 0.5–1.9)

## 2017-09-05 LAB — FERRITIN: FERRITIN: 857 ng/mL — AB (ref 11–307)

## 2017-09-05 LAB — TROPONIN I: Troponin I: <0.03 ng/mL (ref <0.03)

## 2017-09-05 LAB — GLUCOSE, CAPILLARY: GLUCOSE-CAPILLARY: 70 mg/dL (ref 65–99)

## 2017-09-05 MED ORDER — ENOXAPARIN SODIUM 30 MG/0.3ML ~~LOC~~ SOLN: 30.0000 mg | SUBCUTANEOUS | Status: DC

## 2017-09-05 MED ORDER — CYANOCOBALAMIN 1000 MCG/ML IJ SOLN
1000.0000 ug | Freq: Once | INTRAMUSCULAR | Status: AC
  Administered 2017-09-05: 1000 ug via INTRAMUSCULAR
  Filled 2017-09-05: qty 1

## 2017-09-05 MED ORDER — POLYETHYLENE GLYCOL 3350 17 G PO PACK
17.0000 g | PACK | Freq: Every day | ORAL | Status: DC
  Administered 2017-09-05 – 2017-09-07 (×3): 17 g via ORAL
  Filled 2017-09-05 (×3): qty 1

## 2017-09-05 MED ORDER — POTASSIUM CHLORIDE CRYS ER 20 MEQ PO TBCR
20.0000 meq | EXTENDED_RELEASE_TABLET | Freq: Two times a day (BID) | ORAL | Status: DC
  Administered 2017-09-05 – 2017-09-07 (×5): 20 meq via ORAL
  Filled 2017-09-05 (×5): qty 1

## 2017-09-05 NOTE — Evaluation (Signed)
Occupational Therapy Evaluation Patient Details Name: Wendy [name] MRN: [id number] DOB: 1917/07/13 Today's Date: 09/05/2017    History of Present Illness 83 y.o. female with a medical history of hypertension, hypothyroidism, presented to the emergency department with complaints of altered mental status, confusion and passing out.   Clinical Impression   Pt reports she was independent with ADL PTA. Currently pt overall min assist for functional mobility and LB ADL due to balance deficits; pt reports this is the first time she has walked since hospitalization. Pt planning to d/c home with supervision from family. Pt would benefit from continued skilled OT to address established goals.    Follow Up Recommendations  No OT follow up;Supervision/Assistance - 24 hour    Equipment Recommendations  Tub/shower seat    Recommendations for Other Services       Precautions / Restrictions Precautions Precautions: Fall Restrictions Weight Bearing Restrictions: No      Mobility Bed Mobility               General bed mobility comments: Pt OOB in chair upon arrival.  Transfers Overall transfer level: Needs assistance Equipment used: None Transfers: Sit to/from Stand Sit to Stand: Min assist         General transfer comment: for steadying balance.    Balance Overall balance assessment: Needs assistance Sitting-balance support: Feet supported;No upper extremity supported Sitting balance-Leahy Scale: Good     Standing balance support: No upper extremity supported;During functional activity Standing balance-Leahy Scale: Fair                             ADL either performed or assessed with clinical judgement   ADL Overall ADL's : Needs assistance/impaired Eating/Feeding: Set up;Sitting   Grooming: Min guard;Standing   Upper Body Bathing: Set up;Supervision/ safety;Sitting   Lower Body Bathing: Minimal assistance;Sit to/from stand   Upper Body Dressing  : Set up;Supervision/safety;Sitting   Lower Body Dressing: Minimal assistance;Sit to/from stand   Toilet Transfer: Minimal assistance;Ambulation Toilet Transfer Details (indicate cue type and reason): Simulated by sit to stand from chair with functional mobility         Functional mobility during ADLs: Minimal assistance       Vision Patient Visual Report: No change from baseline Vision Assessment?: No apparent visual deficits     Perception     Praxis      Pertinent Vitals/Pain Pain Assessment: No/denies pain     Hand Dominance Right   Extremity/Trunk Assessment Upper Extremity Assessment Upper Extremity Assessment: Generalized weakness   Lower Extremity Assessment Lower Extremity Assessment: Defer to PT evaluation   Cervical / Trunk Assessment Cervical / Trunk Assessment: Kyphotic   Communication Communication Communication: No difficulties   Cognition Arousal/Alertness: Awake/alert Behavior During Therapy: WFL for tasks assessed/performed Overall Cognitive Status: History of cognitive impairments - at baseline                                     General Comments       Exercises     Shoulder Instructions      Home Living Family/patient expects to be discharged to:: Private residence Living Arrangements: Children(daughter) Available Help at Discharge: Family Type of Home: House       Home Layout: Two level;Bed/bath upstairs     Bathroom Shower/Tub: Occupational psychologist: Standard  Home Equipment: None          Prior Functioning/Environment Level of Independence: Independent                 OT Problem List: Decreased strength;Decreased activity tolerance;Impaired balance (sitting and/or standing);Decreased cognition;Decreased safety awareness;Decreased knowledge of use of DME or AE      OT Treatment/Interventions: Self-care/ADL training;Therapeutic exercise;Energy conservation;DME and/or AE  instruction;Therapeutic activities;Patient/family education;Balance training    OT Goals(Current goals can be found in the care plan section) Acute Rehab OT Goals Patient Stated Goal: return home OT Goal Formulation: With patient Time For Goal Achievement: 09/19/17 Potential to Achieve Goals: Good ADL Goals Pt Will Perform Grooming: with modified independence;standing Pt Will Perform Upper Body Bathing: with modified independence;sitting Pt Will Perform Lower Body Bathing: with modified independence;sit to/from stand Pt Will Transfer to Toilet: with modified independence;ambulating;regular height toilet Pt Will Perform Toileting - Clothing Manipulation and hygiene: with modified independence;sit to/from stand Pt Will Perform Tub/Shower Transfer: with modified independence;ambulating;shower seat  OT Frequency: Min 2X/week   Barriers to D/C:            Co-evaluation              AM-PAC PT "6 Clicks" Daily Activity     Outcome Measure Help from another person eating meals?: A Little Help from another person taking care of personal grooming?: A Little Help from another person toileting, which includes using toliet, bedpan, or urinal?: A Little Help from another person bathing (including washing, rinsing, drying)?: A Little Help from another person to put on and taking off regular upper body clothing?: A Little Help from another person to put on and taking off regular lower body clothing?: A Little 6 Click Score: 18   End of Session Equipment Utilized During Treatment: Gait belt Nurse Communication: Mobility status  Activity Tolerance: Patient tolerated treatment well Patient left: in chair;with call bell/phone within reach;with chair alarm set;with nursing/sitter in room;with family/visitor present  OT Visit Diagnosis: Unsteadiness on feet (R26.81)                Time: 1610-9604 OT Time Calculation (min): 14 min Charges:  OT General Charges $OT Visit: 1 Visit OT  Evaluation $OT Eval Low Complexity: 1 Low G-Codes: OT G-codes **NOT FOR INPATIENT CLASS** Functional Assessment Tool Used: Clinical judgement Functional Limitation: Self care Self Care Current Status (V4098): At least 1 percent but less than 20 percent impaired, limited or restricted Self Care Goal Status (J1914): At least 1 percent but less than 20 percent impaired, limited or restricted   Mel Almond A. Ulice Brilliant, M.S., OTR/L Pager: [place] 09/05/2017, 12:36 PM

## 2017-09-05 NOTE — Progress Notes (Signed)
PROGRESS NOTE    Wendy [name]  ZHY:[id number] DOB: 04-08-1917 DOA: 09/04/2017 PCP: Lajean Manes, MD   Chief Complaint  Patient presents with  . Weakness  . Near Syncope    Brief Narrative:  HPI on 09/04/2017 CHRISMA HURLOCK is a 83 y.o. female with a medical history of hypertension, hypothyroidism, presented to the emergency department with complaints of altered mental status, confusion and passing out. Patient's daughter noted the patient had eaten impound her somewhat slumped over afterwards. EMS was called, upon arrival patient was found to be hypotensive and hypoxic. Blood pressure was 80/40, with oxygen level of 85. Patient was given 1 L of fluids in route to the hospital. Upon arrival she was noted to have a normal blood pressure and satting 100% on room air. Of note patient did have similar episode Tuesday of this past week. Currently she denies any chest pain, shortness breath, abdominal pain, nausea vomiting, diarrhea constipation, dizziness or headache, change in bowel pattern urinary frequency. Per patient's daughter, patient has had poor appetite and has been more tired lately. She states her mother's thyroid medication was changed recently.  Assessment & Plan   Syncope -Suspect cardiogenic versus vasovagal as patient was found to have blood pressure in the 29B systolically upon EMS arrival. -Continue telemetry monitoring -UA/chest x-ray unremarkable for infection -Echocardiogram pending -Cardiology consulted and appreciated -Patient did have a drop in BP when standing (+orthostatics) -PT and OT consulted  Elevated troponin -Likely secondary to the above -POC troponin on admission 0.14 -troponin cycled and negative -Echocardiogram pending -Cardiology consulted and appreciated  Elevated lactic acid -Resolved -No source of infection, patient currently afebrile -As above, UA and chest x-ray unremarkable for infection -Likely secondary to syncope and  hypovolemia -Was 3 on admission, currently down to 1.2  Essential hypertension -Continue to hold losartan/HCTZ -Continue to monitor closely  Depression -Continue Celexa  Hypothyroidism -Continue Synthroid  Normocytic anemia -On admission, Hemoglobin 7.9, possibly dilutional component as patient did receive IV fluids in route to the hospital. Labs back in 2014 show hemoglobin of 11 -Currently hemoglobin down to 7.2, however, likely dilutional as patient has been receiving gentle IVF -Anemia panel showed adequate iron and stores -FOBT pending -Continue to monitor CBC  Possible Acute kidney Injury -creatinine upon admission 1.24 -possibly elevated due to hypotension, dehydration, vs medications -labs in 2014- basline about 0.8 -placed on gentle IVF -creatinine currently 1.09 -continue to monitor BMP  Hypokalemia -Continue to replace a and monitor BMP -magnesium 1.9  Transient hypoxia -patient was hypoxic at home prior to admission, no longer hypoxic -currently maintaining O2 saturations 100% on room air -continue to monitor  Hypothyroidism -Continue synthroid -TSH 2.403, 0.87 (WNL)  Generalized weakness -Vitamin D level pending -TSH WNL -B12 470, WNL , however could be better, will give one injection of B12  -PT/OT consulted  DVT Prophylaxis  Lovenox  Code Status: Full  Family Communication: daughter at bedside  Disposition Plan: Observation, pending echocardiogram and cardiology consult  Consultants Cardiology, Dr. Doylene Canard (covering for Dr. Terrence Dupont)  Procedures  None  Antibiotics   Anti-infectives (From admission, onward)   None      Subjective:   Wendy [name] seen and examined today. Patient feeling better today.  Patient denies dizziness, chest pain, shortness of breath, abdominal pain, N/V/D/C, new weakness, numbess, tingling.    Objective:   Vitals:   09/04/17 1945 09/04/17 2018 09/05/17 0500 09/05/17 0935  BP: 132/77 (!) 158/77  124/73 (!) 94/58  Pulse: 66 81 (!)  54   Resp: 19 18 18    Temp:  98.1 F (36.7 C) 98 F (36.7 C)   TempSrc:  Oral Oral   SpO2: 100% 100% 99% 100%  Weight:   63.5 kg (140 lb 1.6 oz)   Height:  5\' 2"  (1.575 m)      Intake/Output Summary (Last 24 hours) at 09/05/2017 1008 Last data filed at 09/05/2017 0900 Gross per 24 hour  Intake 726.75 ml  Output 500 ml  Net 226.75 ml   Filed Weights   09/04/17 1531 09/05/17 0500  Weight: 64.4 kg (142 lb) 63.5 kg (140 lb 1.6 oz)    Exam  General: Well developed, elderly, NAD  HEENT: NCAT, mucous membranes moist.   Cardiovascular: S1 S2 auscultated, RRR, no murmur appreciated  Respiratory: Clear to auscultation bilaterally with equal chest rise  Abdomen: Soft, nontender, nondistended, + bowel sounds  Extremities: warm dry without cyanosis clubbing or edema  Neuro: AAOx3, nonfocal  Psych: Normal affect and demeanor, pleasant    Data Reviewed: I have personally reviewed following labs and imaging studies  CBC: Recent Labs  Lab 09/04/17 1533 09/04/17 2011 09/05/17 0703  WBC 2.6* 3.7* 2.8*  NEUTROABS  --  2.3  --   HGB 7.9* 8.0* 7.2*  HCT 24.3* 24.4* 22.1*  MCV 95.7 94.2 94.4  PLT 188 209 314   Basic Metabolic Panel: Recent Labs  Lab 09/04/17 1533 09/04/17 1915 09/04/17 2011 09/05/17 0703  NA 137  --  138 139  K 3.0*  --  3.5 3.4*  CL 107  --  105 109  CO2 21*  --  27 25  GLUCOSE 108*  --  88 74  BUN 26*  --  22* 18  CREATININE 1.24*  --  1.18* 1.09*  CALCIUM 8.4*  --  8.7* 8.4*  MG  --  1.9  --   --    GFR: Estimated Creatinine Clearance: 25.2 mL/min (A) (by C-G formula based on SCr of 1.09 mg/dL (H)). Liver Function Tests: Recent Labs  Lab 09/04/17 2011 09/05/17 0703  AST 23 21  ALT 7* 9*  ALKPHOS 48 42  BILITOT 0.8 1.1  PROT 7.3 6.9  ALBUMIN 3.4* 3.0*   No results for input(s): LIPASE, AMYLASE in the last 168 hours. No results for input(s): AMMONIA in the last 168 hours. Coagulation  Profile: Recent Labs  Lab 09/04/17 2011  INR 1.09   Cardiac Enzymes: Recent Labs  Lab 09/04/17 1915 09/05/17 0138 09/05/17 0703  TROPONINI <0.03 <0.03 <0.03   BNP (last 3 results) No results for input(s): PROBNP in the last 8760 hours. HbA1C: No results for input(s): HGBA1C in the last 72 hours. CBG: Recent Labs  Lab 09/05/17 0623  GLUCAP 70   Lipid Profile: No results for input(s): CHOL, HDL, LDLCALC, TRIG, CHOLHDL, LDLDIRECT in the last 72 hours. Thyroid Function Tests: Recent Labs    09/05/17 0138 09/05/17 0703  TSH 2.403  --   FREET4  --  0.87   Anemia Panel: Recent Labs    09/04/17 1915 09/04/17 2011 09/05/17 0138  VITAMINB12  --  470  --   FOLATE  --   --  19.5  FERRITIN  --   --  857*  TIBC  --   --  234*  IRON  --   --  53  RETICCTPCT 1.6  --   --    Urine analysis:    Component Value Date/Time   COLORURINE YELLOW 09/04/2017 1720   APPEARANCEUR  CLEAR 09/04/2017 1720   LABSPEC 1.011 09/04/2017 1720   PHURINE 6.0 09/04/2017 1720   GLUCOSEU NEGATIVE 09/04/2017 1720   HGBUR NEGATIVE 09/04/2017 1720   BILIRUBINUR NEGATIVE 09/04/2017 1720   KETONESUR NEGATIVE 09/04/2017 1720   PROTEINUR NEGATIVE 09/04/2017 1720   UROBILINOGEN 0.2 09/28/2013 1136   NITRITE NEGATIVE 09/04/2017 1720   LEUKOCYTESUR NEGATIVE 09/04/2017 1720   Sepsis Labs: @LABRCNTIP (procalcitonin:4,lacticidven:4)  )No results found for this or any previous visit (from the past 240 hour(s)).    Radiology Studies: Dg Chest 2 View  Result Date: 09/04/2017 CLINICAL DATA:  Confusion and altered mental status. EXAM: CHEST  2 VIEW COMPARISON:  Chest x-ray dated 09/26/2013. FINDINGS: Stable cardiomegaly. Atherosclerotic changes of the ectatic thoracic aorta. Study is slightly hypoinspiratory. Given the low lung volumes, lungs appear clear. No pleural effusion or pneumothorax seen. No acute or suspicious osseous finding. IMPRESSION: No active cardiopulmonary disease. No evidence of  pneumonia or pulmonary edema. Aortic atherosclerosis. Electronically Signed   By: Franki Cabot M.D.   On: 09/04/2017 16:47   Ct Head Wo Contrast  Result Date: 09/04/2017 CLINICAL DATA:  Confusion and altered mental status. Hypoxia and hypotension. EXAM: CT HEAD WITHOUT CONTRAST TECHNIQUE: Contiguous axial images were obtained from the base of the skull through the vertex without intravenous contrast. COMPARISON:  Head CT dated 09/26/2013 and brain MRI dated 09/27/2013. FINDINGS: Brain: There is mild generalized age related parenchymal atrophy with commensurate dilatation of the ventricles and sulci. Mild chronic small vessel ischemic changes are again noted within the bilateral periventricular white matter regions. There is no mass, hemorrhage, edema or other evidence of acute parenchymal abnormality. No extra-axial hemorrhage. Vascular: There are chronic calcified atherosclerotic changes of the large vessels at the skull base. No unexpected hyperdense vessel. Skull: Normal. Negative for fracture or focal lesion. Sinuses/Orbits: No acute finding. Other: None. IMPRESSION: 1. No acute findings.  No intracranial mass, hemorrhage or edema. 2. Mild chronic small vessel ischemic changes in the white matter. Electronically Signed   By: Franki Cabot M.D.   On: 09/04/2017 16:46     Scheduled Meds: . aspirin EC  81 mg Oral QPC supper  . citalopram  5 mg Oral QPC supper  . enoxaparin (LOVENOX) injection  40 mg Subcutaneous Q24H  . feeding supplement (ENSURE ENLIVE)  237 mL Oral BID BM  . levothyroxine  50 mcg Oral QAC breakfast  . potassium chloride  20 mEq Oral BID  . sodium chloride flush  3 mL Intravenous Q12H   Continuous Infusions: . sodium chloride 75 mL/hr at 09/05/17 0943     LOS: 0 days   Time Spent in minutes   30 minutes  Jihan Mellette D.O. on 09/05/2017 at 10:08 AM  Between 7am to 7pm - Pager - [phone]  After 7pm go to [email or web address] - password [email or web address]  And look for the night  coverage person covering for me after hours  [facility] Office  [phone]

## 2017-09-05 NOTE — Evaluation (Signed)
Physical Therapy Evaluation Patient Details Name: Wendy [name] MRN: [id number] DOB: 1916-11-22 Today's Date: 09/05/2017   History of Present Illness  83 y.o. female with a medical history of hypertension, hypothyroidism, presented to the emergency department with complaints of altered mental status, confusion and passing out.  Clinical Impression  Orders received for PT evaluation. Patient demonstrates deficits in functional mobility as indicated below. Will benefit from continued skilled PT to address deficits and maximize function. Will see as indicated and progress as tolerated.      Follow Up Recommendations No PT follow up;Supervision/Assistance - 24 hour    Equipment Recommendations  None recommended by PT    Recommendations for Other Services       Precautions / Restrictions Precautions Precautions: Fall Restrictions Weight Bearing Restrictions: No      Mobility  Bed Mobility               General bed mobility comments: Pt OOB in chair upon arrival.  Transfers Overall transfer level: Needs assistance Equipment used: None Transfers: Sit to/from Stand Sit to Stand: Min assist         General transfer comment: for steadying balance.  Ambulation/Gait Ambulation/Gait assistance: Supervision Ambulation Distance (Feet): 380 Feet Assistive device: None Gait Pattern/deviations: Step-through pattern;Decreased stride length;Trunk flexed;Narrow base of support Gait velocity: decreased   General Gait Details: steady with ambulation  Stairs Stairs: Yes Stairs assistance: Supervision Stair Management: One rail Right Number of Stairs: 5 General stair comments: no difficulty, supervision for safety  Wheelchair Mobility    Modified Rankin (Stroke Patients Only)       Balance Overall balance assessment: Needs assistance Sitting-balance support: Feet supported;No upper extremity supported Sitting balance-Leahy Scale: Good     Standing balance  support: No upper extremity supported;During functional activity Standing balance-Leahy Scale: Fair                               Pertinent Vitals/Pain Pain Assessment: No/denies pain    Home Living Family/patient expects to be discharged to:: Private residence Living Arrangements: Children(daughter) Available Help at Discharge: Family Type of Home: House       Home Layout: Two level;Bed/bath upstairs Home Equipment: None      Prior Function Level of Independence: Independent               Hand Dominance   Dominant Hand: Right    Extremity/Trunk Assessment   Upper Extremity Assessment Upper Extremity Assessment: Generalized weakness    Lower Extremity Assessment Lower Extremity Assessment: Generalized weakness    Cervical / Trunk Assessment Cervical / Trunk Assessment: Kyphotic  Communication   Communication: No difficulties  Cognition Arousal/Alertness: Awake/alert Behavior During Therapy: WFL for tasks assessed/performed Overall Cognitive Status: History of cognitive impairments - at baseline                                        General Comments      Exercises     Assessment/Plan    PT Assessment Patient needs continued PT services  PT Problem List Decreased strength;Decreased activity tolerance;Decreased balance;Decreased mobility       PT Treatment Interventions DME instruction;Gait training;Stair training;Functional mobility training;Therapeutic activities;Therapeutic exercise;Balance training;Neuromuscular re-education    PT Goals (Current goals can be found in the Care Plan section)  Acute Rehab PT Goals Patient Stated Goal: return home  PT Goal Formulation: With patient/family Time For Goal Achievement: 09/19/17 Potential to Achieve Goals: Good    Frequency Min 3X/week   Barriers to discharge        Co-evaluation               AM-PAC PT "6 Clicks" Daily Activity  Outcome Measure Difficulty  turning over in bed (including adjusting bedclothes, sheets and blankets)?: A Little Difficulty moving from lying on back to sitting on the side of the bed? : A Little Difficulty sitting down on and standing up from a chair with arms (e.g., wheelchair, bedside commode, etc,.)?: A Little Help needed moving to and from a bed to chair (including a wheelchair)?: A Little Help needed walking in hospital room?: A Little Help needed climbing 3-5 steps with a railing? : A Little 6 Click Score: 18    End of Session Equipment Utilized During Treatment: Gait belt Activity Tolerance: Patient tolerated treatment well Patient left: in chair;with call bell/phone within reach;with family/visitor present Nurse Communication: Mobility status PT Visit Diagnosis: Unsteadiness on feet (R26.81)    Time: 1206-1224 PT Time Calculation (min) (ACUTE ONLY): 18 min   Charges:   PT Evaluation $PT Eval Moderate Complexity: 1 Mod     PT G Codes:   PT G-Codes **NOT FOR INPATIENT CLASS** Functional Assessment Tool Used: Clinical judgement Functional Limitation: Mobility: Walking and moving around Mobility: Walking and Moving Around Current Status (E0223): At least 1 percent but less than 20 percent impaired, limited or restricted Mobility: Walking and Moving Around Goal Status [phone]): 0 percent impaired, limited or restricted    Alben Deeds, PT DPT  Board Certified Neurologic Specialist [place] 09/05/2017, 1:26 PM

## 2017-09-05 NOTE — Consult Note (Signed)
Referring Physician: Wende Neighbors, MD  Wendy [name] is an 83 y.o. female.                       Chief Complaint: Near syncope  HPI: 83 year old female with normal LV systolic function in [id number]/6503 has near syncope episode with hypotension and hypoxemia. Her Troponin-I are normal x 3, CT scan unremarkable for age, mild hypokalemia and mildly elevated creatinine and EKG showing SR, RBBB and possible inferior wall MI. She was on Losartan-HCTZ and has decreasing oral food and fluid intake per family. She feels better post IV fluids. She denies chest pain or palpitations.  Past Medical History:  Diagnosis Date  . Hypertension   . Thyroid disease       Past Surgical History:  Procedure Laterality Date  . CORONARY ANGIOPLASTY      History reviewed. No pertinent family history. Social History:  reports that  has never smoked. she has never used smokeless tobacco. She reports that she does not drink alcohol or use drugs.  Allergies: No Known Allergies  Medications Prior to Admission  Medication Sig Dispense Refill  . aspirin EC 81 MG tablet Take 81 mg daily after supper by mouth.     . citalopram (CELEXA) 10 MG tablet Take 5 mg daily after supper by mouth.     . ENSURE (ENSURE) Take 237 mLs by mouth 2 (two) times daily between meals.    Marland Kitchen levothyroxine (SYNTHROID, LEVOTHROID) 50 MCG tablet Take 50 mcg daily before breakfast by mouth.     . losartan-hydrochlorothiazide (HYZAAR) 100-25 MG per tablet Take 0.5 tablets by mouth daily. (Patient taking differently: Take 1 tablet daily after supper by mouth. ) 30 tablet 0  . potassium chloride (KLOR-CON) 8 MEQ tablet Take 8 mEq daily by mouth.    . triamcinolone (KENALOG) 0.025 % cream Apply 1 application 2 (two) times daily as needed topically (rash).    . Vitamin D, Ergocalciferol, (DRISDOL) 50000 units CAPS capsule Take 50,000 Units every 7 (seven) days by mouth.    Marland Kitchen guaiFENesin (MUCINEX) 600 MG 12 hr tablet Take 1 tablet (600  mg total) by mouth 2 (two) times daily as needed for cough. (Patient not taking: Reported on 09/04/2017) 20 tablet 0  . hydrocortisone cream 1 % Apply to affected area 2 times daily (Patient not taking: Reported on 09/04/2017) 15 g 0    Results for orders placed or performed during the hospital encounter of 09/04/17 (from the past 48 hour(s))  Basic metabolic panel     Status: Abnormal   Collection Time: 09/04/17  3:33 PM  Result Value Ref Range   Sodium 137 135 - 145 mmol/L   Potassium 3.0 (L) 3.5 - 5.1 mmol/L   Chloride 107 101 - 111 mmol/L   CO2 21 (L) 22 - 32 mmol/L   Glucose, Bld 108 (H) 65 - 99 mg/dL   BUN 26 (H) 6 - 20 mg/dL   Creatinine, Ser 1.24 (H) 0.44 - 1.00 mg/dL   Calcium 8.4 (L) 8.9 - 10.3 mg/dL   GFR calc non Af Amer 35 (L) >60 mL/min   GFR calc Af Amer 41 (L) >60 mL/min    Comment: (NOTE) The eGFR has been calculated using the CKD EPI equation. This calculation has not been validated in all clinical situations. eGFR's persistently <60 mL/min signify possible Chronic Kidney Disease.    Anion gap 9 5 - 15  CBC     Status:  Abnormal   Collection Time: 09/04/17  3:33 PM  Result Value Ref Range   WBC 2.6 (L) 4.0 - 10.5 K/uL   RBC 2.54 (L) 3.87 - 5.11 MIL/uL   Hemoglobin 7.9 (L) 12.0 - 15.0 g/dL   HCT 24.3 (L) 36.0 - 46.0 %   MCV 95.7 78.0 - 100.0 fL   MCH 31.1 26.0 - 34.0 pg   MCHC 32.5 30.0 - 36.0 g/dL   RDW 15.8 (H) 11.5 - 15.5 %   Platelets 188 150 - 400 K/uL  I-stat troponin, ED     Status: Abnormal   Collection Time: 09/04/17  3:57 PM  Result Value Ref Range   Troponin i, poc 0.14 (HH) 0.00 - 0.08 ng/mL   Comment NOTIFIED PHYSICIAN    Comment 3            Comment: Due to the release kinetics of cTnI, a negative result within the first hours of the onset of symptoms does not rule out myocardial infarction with certainty. If myocardial infarction is still suspected, repeat the test at appropriate intervals.   I-Stat CG4 Lactic Acid, ED     Status:  Abnormal   Collection Time: 09/04/17  3:58 PM  Result Value Ref Range   Lactic Acid, Venous 3.00 (HH) 0.5 - 1.9 mmol/L   Comment NOTIFIED PHYSICIAN   Urinalysis, Routine w reflex microscopic     Status: None   Collection Time: 09/04/17  5:20 PM  Result Value Ref Range   Color, Urine YELLOW YELLOW   APPearance CLEAR CLEAR   Specific Gravity, Urine 1.011 1.005 - 1.030   pH 6.0 5.0 - 8.0   Glucose, UA NEGATIVE NEGATIVE mg/dL   Hgb urine dipstick NEGATIVE NEGATIVE   Bilirubin Urine NEGATIVE NEGATIVE   Ketones, ur NEGATIVE NEGATIVE mg/dL   Protein, ur NEGATIVE NEGATIVE mg/dL   Nitrite NEGATIVE NEGATIVE   Leukocytes, UA NEGATIVE NEGATIVE  Magnesium     Status: None   Collection Time: 09/04/17  7:15 PM  Result Value Ref Range   Magnesium 1.9 1.7 - 2.4 mg/dL  Troponin I (q 6hr x 3)     Status: None   Collection Time: 09/04/17  7:15 PM  Result Value Ref Range   Troponin I <0.03 <0.03 ng/mL  Reticulocytes     Status: Abnormal   Collection Time: 09/04/17  7:15 PM  Result Value Ref Range   Retic Ct Pct 1.6 0.4 - 3.1 %   RBC. 2.45 (L) 3.87 - 5.11 MIL/uL   Retic Count, Absolute 39.2 19.0 - 186.0 K/uL  CBC with Differential/Platelet     Status: Abnormal   Collection Time: 09/04/17  8:11 PM  Result Value Ref Range   WBC 3.7 (L) 4.0 - 10.5 K/uL   RBC 2.59 (L) 3.87 - 5.11 MIL/uL   Hemoglobin 8.0 (L) 12.0 - 15.0 g/dL   HCT 24.4 (L) 36.0 - 46.0 %   MCV 94.2 78.0 - 100.0 fL   MCH 30.9 26.0 - 34.0 pg   MCHC 32.8 30.0 - 36.0 g/dL   RDW 15.7 (H) 11.5 - 15.5 %   Platelets 209 150 - 400 K/uL   Neutrophils Relative % 62 %   Neutro Abs 2.3 1.7 - 7.7 K/uL   Lymphocytes Relative 32 %   Lymphs Abs 1.2 0.7 - 4.0 K/uL   Monocytes Relative 6 %   Monocytes Absolute 0.2 0.1 - 1.0 K/uL   Eosinophils Relative 0 %   Eosinophils Absolute 0.0 0.0 - 0.7 K/uL  Basophils Relative 0 %   Basophils Absolute 0.0 0.0 - 0.1 K/uL  Comprehensive metabolic panel     Status: Abnormal   Collection Time: 09/04/17   8:11 PM  Result Value Ref Range   Sodium 138 135 - 145 mmol/L   Potassium 3.5 3.5 - 5.1 mmol/L   Chloride 105 101 - 111 mmol/L   CO2 27 22 - 32 mmol/L   Glucose, Bld 88 65 - 99 mg/dL   BUN 22 (H) 6 - 20 mg/dL   Creatinine, Ser 1.18 (H) 0.44 - 1.00 mg/dL   Calcium 8.7 (L) 8.9 - 10.3 mg/dL   Total Protein 7.3 6.5 - 8.1 g/dL   Albumin 3.4 (L) 3.5 - 5.0 g/dL   AST 23 15 - 41 U/L   ALT 7 (L) 14 - 54 U/L   Alkaline Phosphatase 48 38 - 126 U/L   Total Bilirubin 0.8 0.3 - 1.2 mg/dL   GFR calc non Af Amer 37 (L) >60 mL/min   GFR calc Af Amer 43 (L) >60 mL/min    Comment: (NOTE) The eGFR has been calculated using the CKD EPI equation. This calculation has not been validated in all clinical situations. eGFR's persistently <60 mL/min signify possible Chronic Kidney Disease.    Anion gap 6 5 - 15  Protime-INR     Status: None   Collection Time: 09/04/17  8:11 PM  Result Value Ref Range   Prothrombin Time 14.0 11.4 - 15.2 seconds   INR 1.09   Vitamin B12     Status: None   Collection Time: 09/04/17  8:11 PM  Result Value Ref Range   Vitamin B-12 470 180 - 914 pg/mL    Comment: (NOTE) This assay is not validated for testing neonatal or myeloproliferative syndrome specimens for Vitamin B12 levels.   Troponin I (q 6hr x 3)     Status: None   Collection Time: 09/05/17  1:38 AM  Result Value Ref Range   Troponin I <0.03 <0.03 ng/mL  TSH     Status: None   Collection Time: 09/05/17  1:38 AM  Result Value Ref Range   TSH 2.403 0.350 - 4.500 uIU/mL    Comment: Performed by a 3rd Generation assay with a functional sensitivity of <=0.01 uIU/mL.  Folate     Status: None   Collection Time: 09/05/17  1:38 AM  Result Value Ref Range   Folate 19.5 >5.9 ng/mL  Iron and TIBC     Status: Abnormal   Collection Time: 09/05/17  1:38 AM  Result Value Ref Range   Iron 53 28 - 170 ug/dL   TIBC 234 (L) 250 - 450 ug/dL   Saturation Ratios 23 10.4 - 31.8 %   UIBC 181 ug/dL  Ferritin     Status:  Abnormal   Collection Time: 09/05/17  1:38 AM  Result Value Ref Range   Ferritin 857 (H) 11 - 307 ng/mL  Glucose, capillary     Status: None   Collection Time: 09/05/17  6:23 AM  Result Value Ref Range   Glucose-Capillary 70 65 - 99 mg/dL  Troponin I (q 6hr x 3)     Status: None   Collection Time: 09/05/17  7:03 AM  Result Value Ref Range   Troponin I <0.03 <0.03 ng/mL  T4, free     Status: None   Collection Time: 09/05/17  7:03 AM  Result Value Ref Range   Free T4 0.87 0.61 - 1.12 ng/dL    Comment: (  NOTE) Biotin ingestion may interfere with free T4 tests. If the results are inconsistent with the TSH level, previous test results, or the clinical presentation, then consider biotin interference. If needed, order repeat testing after stopping biotin.   Comprehensive metabolic panel     Status: Abnormal   Collection Time: 09/05/17  7:03 AM  Result Value Ref Range   Sodium 139 135 - 145 mmol/L   Potassium 3.4 (L) 3.5 - 5.1 mmol/L   Chloride 109 101 - 111 mmol/L   CO2 25 22 - 32 mmol/L   Glucose, Bld 74 65 - 99 mg/dL   BUN 18 6 - 20 mg/dL   Creatinine, Ser 1.09 (H) 0.44 - 1.00 mg/dL   Calcium 8.4 (L) 8.9 - 10.3 mg/dL   Total Protein 6.9 6.5 - 8.1 g/dL   Albumin 3.0 (L) 3.5 - 5.0 g/dL   AST 21 15 - 41 U/L   ALT 9 (L) 14 - 54 U/L   Alkaline Phosphatase 42 38 - 126 U/L   Total Bilirubin 1.1 0.3 - 1.2 mg/dL   GFR calc non Af Amer 41 (L) >60 mL/min   GFR calc Af Amer 47 (L) >60 mL/min    Comment: (NOTE) The eGFR has been calculated using the CKD EPI equation. This calculation has not been validated in all clinical situations. eGFR's persistently <60 mL/min signify possible Chronic Kidney Disease.    Anion gap 5 5 - 15  CBC     Status: Abnormal   Collection Time: 09/05/17  7:03 AM  Result Value Ref Range   WBC 2.8 (L) 4.0 - 10.5 K/uL   RBC 2.34 (L) 3.87 - 5.11 MIL/uL   Hemoglobin 7.2 (L) 12.0 - 15.0 g/dL   HCT 22.1 (L) 36.0 - 46.0 %   MCV 94.4 78.0 - 100.0 fL   MCH 30.8  26.0 - 34.0 pg   MCHC 32.6 30.0 - 36.0 g/dL   RDW 15.9 (H) 11.5 - 15.5 %   Platelets 192 150 - 400 K/uL  Lactic acid, plasma     Status: None   Collection Time: 09/05/17  7:03 AM  Result Value Ref Range   Lactic Acid, Venous 1.2 0.5 - 1.9 mmol/L   Dg Chest 2 View  Result Date: 09/04/2017 CLINICAL DATA:  Confusion and altered mental status. EXAM: CHEST  2 VIEW COMPARISON:  Chest x-ray dated 09/26/2013. FINDINGS: Stable cardiomegaly. Atherosclerotic changes of the ectatic thoracic aorta. Study is slightly hypoinspiratory. Given the low lung volumes, lungs appear clear. No pleural effusion or pneumothorax seen. No acute or suspicious osseous finding. IMPRESSION: No active cardiopulmonary disease. No evidence of pneumonia or pulmonary edema. Aortic atherosclerosis. Electronically Signed   By: Franki Cabot M.D.   On: 09/04/2017 16:47   Ct Head Wo Contrast  Result Date: 09/04/2017 CLINICAL DATA:  Confusion and altered mental status. Hypoxia and hypotension. EXAM: CT HEAD WITHOUT CONTRAST TECHNIQUE: Contiguous axial images were obtained from the base of the skull through the vertex without intravenous contrast. COMPARISON:  Head CT dated 09/26/2013 and brain MRI dated 09/27/2013. FINDINGS: Brain: There is mild generalized age related parenchymal atrophy with commensurate dilatation of the ventricles and sulci. Mild chronic small vessel ischemic changes are again noted within the bilateral periventricular white matter regions. There is no mass, hemorrhage, edema or other evidence of acute parenchymal abnormality. No extra-axial hemorrhage. Vascular: There are chronic calcified atherosclerotic changes of the large vessels at the skull base. No unexpected hyperdense vessel. Skull: Normal. Negative for fracture or  focal lesion. Sinuses/Orbits: No acute finding. Other: None. IMPRESSION: 1. No acute findings.  No intracranial mass, hemorrhage or edema. 2. Mild chronic small vessel ischemic changes in the white  matter. Electronically Signed   By: Franki Cabot M.D.   On: 09/04/2017 16:46    Review Of Systems Constitutional: No fever, chills, positive weight loss. Eyes: No vision change, wears glasses. No discharge or pain. Ears: No hearing loss, No tinnitus. Respiratory: No asthma, COPD, pneumonias. No shortness of breath. No hemoptysis. Cardiovascular: No chest pain, palpitation, leg edema. Gastrointestinal: No nausea, vomiting, diarrhea, constipation. No GI bleed. No hepatitis.  Genitourinary: No dysuria, hematuria, kidney stone. No incontinance. Neurological: No headache, stroke, seizures.  Psychiatry: No psych facility admission for anxiety, depression, suicide. No detox. Skin: No rash. Musculoskeletal: Positive joint pain, no fibromyalgia. No neck pain, back pain. Lymphadenopathy: No lymphadenopathy. Hematology: No anemia or easy bruising.   Blood pressure (!) 94/58, pulse (!) 54, temperature 98 F (36.7 C), temperature source Oral, resp. rate 18, height _0  (1.575 m), weight 63.5 kg (140 lb 1.6 oz), SpO2 100 %. Body mass index is 25.62 kg/m. General appearance: alert, cooperative, appears stated age and no distress Head: Normocephalic, atraumatic. Eyes: Brown eyes, pale conjunctiva, corneas clear. PERRL, EOM's intact. Throat: Mid-line, pale tongue Neck: No adenopathy, no carotid bruit, no JVD, supple, symmetrical, trachea midline and thyroid not enlarged. Resp: Clear to auscultation bilaterally. Cardio: Regular rate and rhythm, S1, S2 normal, II/VI systolic murmur, no click, rub or gallop GI: Soft, non-tender; bowel sounds normal; no organomegaly. Extremities: No edema, cyanosis or clubbing. Skin: Warm and dry.  Neurologic: Alert and oriented X 3, normal strength. Normal coordination and gait.  Assessment/Plan Near syncope Hypotension Normocytic normochromic anemia R/O GI bleed R/O RV failure H/O hypertension Hypothyroidism CKD, II Dehydration  Awaiting echocardiogram  for LV function. Agree with holding losartan-HCTZ. Continue IV fluids as tolerated. Suspect acute blood loss and dehydration if no RV failure.  Birdie Riddle, MD  09/05/2017, 11:16 AM

## 2017-09-06 ENCOUNTER — Encounter (HOSPITAL_COMMUNITY): Payer: Self-pay | Admitting: *Deleted

## 2017-09-06 DIAGNOSIS — E039 Hypothyroidism, unspecified: Secondary | ICD-10-CM | POA: Diagnosis not present

## 2017-09-06 DIAGNOSIS — I10 Essential (primary) hypertension: Secondary | ICD-10-CM

## 2017-09-06 DIAGNOSIS — R55 Syncope and collapse: Secondary | ICD-10-CM | POA: Diagnosis not present

## 2017-09-06 DIAGNOSIS — F329 Major depressive disorder, single episode, unspecified: Secondary | ICD-10-CM | POA: Diagnosis not present

## 2017-09-06 LAB — CBC
HCT: 20.7 % — ABNORMAL LOW (ref 36.0–46.0)
Hemoglobin: 7 g/dL — ABNORMAL LOW (ref 12.0–15.0)
MCH: 32.1 pg (ref 26.0–34.0)
MCHC: 33.8 g/dL (ref 30.0–36.0)
MCV: 95 fL (ref 78.0–100.0)
PLATELETS: 175 10*3/uL (ref 150–400)
RBC: 2.18 MIL/uL — AB (ref 3.87–5.11)
RDW: 16.3 % — AB (ref 11.5–15.5)
WBC: 3 10*3/uL — ABNORMAL LOW (ref 4.0–10.5)

## 2017-09-06 LAB — VITAMIN D 25 HYDROXY (VIT D DEFICIENCY, FRACTURES): Vit D, 25-Hydroxy: 21.4 ng/mL — ABNORMAL LOW (ref 30.0–100.0)

## 2017-09-06 LAB — BASIC METABOLIC PANEL
Anion gap: 4 — ABNORMAL LOW (ref 5–15)
BUN: 12 mg/dL (ref 6–20)
CALCIUM: 8.3 mg/dL — AB (ref 8.9–10.3)
CO2: 25 mmol/L (ref 22–32)
CREATININE: 1.07 mg/dL — AB (ref 0.44–1.00)
Chloride: 110 mmol/L (ref 101–111)
GFR calc Af Amer: 48 mL/min — ABNORMAL LOW (ref >60)
GFR, EST NON AFRICAN AMERICAN: 42 mL/min — AB (ref >60)
Glucose, Bld: 76 mg/dL (ref 65–99)
Potassium: 3.9 mmol/L (ref 3.5–5.1)
SODIUM: 139 mmol/L (ref 135–145)

## 2017-09-06 LAB — PREPARE RBC (CROSSMATCH)

## 2017-09-06 LAB — DIRECT ANTIGLOBULIN TEST (NOT AT ARMC)
DAT, IgG: POSITIVE
DAT, complement: POSITIVE

## 2017-09-06 MED ORDER — SODIUM CHLORIDE 0.9 % IV SOLN: Freq: Once | INTRAVENOUS | Status: DC

## 2017-09-06 MED ORDER — SENNOSIDES-DOCUSATE SODIUM 8.6-50 MG PO TABS
1.0000 | ORAL_TABLET | Freq: Two times a day (BID) | ORAL | Status: DC
  Administered 2017-09-06 – 2017-09-07 (×3): 1 via ORAL
  Filled 2017-09-06 (×3): qty 1

## 2017-09-06 NOTE — Progress Notes (Signed)
Pt refuses daily CBGs, stating that she is "not diabetic."

## 2017-09-06 NOTE — Progress Notes (Signed)
Initial Nutrition Assessment  DOCUMENTATION CODES:   Not applicable  INTERVENTION:   -Continue Ensure Enlive po BID, each supplement provides 350 kcal and 20 grams of protein  NUTRITION DIAGNOSIS:   Inadequate oral intake related to decreased appetite as evidenced by meal completion < 50%.  GOAL:   Patient will meet greater than or equal to 90% of their needs  MONITOR:   PO intake, Supplement acceptance, Labs, Diet advancement, Weight trends, Skin, I & O's  REASON FOR ASSESSMENT:   Malnutrition Screening Tool, Consult Assessment of nutrition requirement/status  ASSESSMENT:   Wendy [name] is a 83 y.o. female with a medical history of hypertension, hypothyroidism, presented to the emergency department with complaints of altered mental status, confusion and passing out.  Pt admitted with syncope.   Spoke with pt daughter and close friend outside of room prior to visit. They report that pt has eaten minimally since coming to the hospital. However, pt appetite is usually good. Pt does not consume beef or pork products. Pt consumed veyr little lunch (100% of pudding and a few bites of chicken and vegetables). Meal completion 25-80%.   Spoke with pt, who confirms good appetite typically. She consumes 3 meals per day (Breakfast: grits and eggs or cereal, Lunch and Dinner: meat, starch, and vegetables). Pt reports that "my stomach gets full quickly" and will sometimes skip a meal during the day. Per her report, food portions are usually hearty. When pt does not eat well, she will consume an Ensure supplement, which she has also been consuming during hospitalization.   Pt denies any weight loss. She reports UBW is around 140#.   Reviewed cardiology notes; plan for echocardiogram today.   Discussed with pt importance of good meal and supplement intake to promote healing. Encouraged pt to continue consuming Ensure when appetite is poor.  Labs reviewed.   NUTRITION - FOCUSED  PHYSICAL EXAM:    Most Recent Value  Orbital Region  No depletion  Upper Arm Region  Mild depletion  Thoracic and Lumbar Region  No depletion  Buccal Region  No depletion  Temple Region  No depletion  Clavicle Bone Region  No depletion  Clavicle and Acromion Bone Region  No depletion  Scapular Bone Region  No depletion  Dorsal Hand  Mild depletion  Patellar Region  No depletion  Anterior Thigh Region  No depletion  Posterior Calf Region  No depletion  Edema (RD Assessment)  Mild  Hair  Reviewed  Eyes  Reviewed  Mouth  Reviewed  Skin  Reviewed  Nails  Reviewed       Diet Order:  Diet Heart Room service appropriate? Yes; Fluid consistency: Thin  EDUCATION NEEDS:   Education needs have been addressed  Skin:  Skin Assessment: Reviewed RN Assessment  Last BM:  09/03/17  Height:   Ht Readings from Last 1 Encounters:  09/04/17 5\' 2"  (1.575 m)    Weight:   Wt Readings from Last 1 Encounters:  09/06/17 137 lb 3.2 oz (62.2 kg)    Ideal Body Weight:  50 kg  BMI:  Body mass index is 25.09 kg/m.  Estimated Nutritional Needs:   Kcal:  1400-1600  Protein:  65-80 grams  Fluid:  1.4-1.6 L    Ovetta Bazzano A. Jimmye Norman, RD, LDN, CDE Pager: [phone] After hours Pager: [phone]

## 2017-09-06 NOTE — Progress Notes (Addendum)
11:18:22 - pt had 9 beats VT.  Asymptomatic.  VSS.  Have notified Dr. Horris Latino. ------------------------------------------------------------------------- Per Dr. Horris Latino, this RN notified Dr. Terrence Dupont of above referenced VT.  Dr. Terrence Dupont gave verbal order for BMET and [place] lab draws for in the AM.  Orders already exist for same.

## 2017-09-06 NOTE — Plan of Care (Signed)
  Progressing Education: Knowledge of General Education information will improve 09/06/2017 1133 - Progressing by Imagene Gurney, RN Health Behavior/Discharge Planning: Ability to manage health-related needs will improve 09/06/2017 1133 - Progressing by Imagene Gurney, RN Clinical Measurements: Ability to maintain clinical measurements within normal limits will improve 09/06/2017 1133 - Progressing by Imagene Gurney, RN Will remain free from infection 09/06/2017 1133 - Progressing by Imagene Gurney, RN Diagnostic test results will improve 09/06/2017 1133 - Progressing by Imagene Gurney, RN Respiratory complications will improve 09/06/2017 1133 - Progressing by Imagene Gurney, RN Cardiovascular complication will be avoided 09/06/2017 1133 - Progressing by Imagene Gurney, RN Activity: Risk for activity intolerance will decrease 09/06/2017 1133 - Progressing by Imagene Gurney, RN Nutrition: Adequate nutrition will be maintained 09/06/2017 1133 - Progressing by Imagene Gurney, RN Coping: Level of anxiety will decrease 09/06/2017 1133 - Progressing by Imagene Gurney, RN Elimination: Will not experience complications related to bowel motility 09/06/2017 1133 - Progressing by Imagene Gurney, RN Will not experience complications related to urinary retention 09/06/2017 1133 - Progressing by Imagene Gurney, RN Pain Managment: General experience of comfort will improve 09/06/2017 1133 - Progressing by Imagene Gurney, RN Safety: Ability to remain free from injury will improve 09/06/2017 1133 - Progressing by Imagene Gurney, RN Skin Integrity: Risk for impaired skin integrity will decrease 09/06/2017 1133 - Progressing by Imagene Gurney, RN

## 2017-09-06 NOTE — Progress Notes (Signed)
Subjective:  And doing well denies any chest pain or shortness of breath. No further episodes of near syncope. Hemoglobin trending down no BM yet scheduled to receive packed RBCs in view of CAD  Objective:  Vital Signs in the last 24 hours: Temp:  [98 F (36.7 C)-98.9 F (37.2 C)] 98.9 F (37.2 C) (11/19 0736) Pulse Rate:  [54-61] 55 (11/19 0736) Resp:  [18] 18 (11/19 0736) BP: (119-132)/(50-61) 132/58 (11/19 0736) SpO2:  [98 %-100 %] 99 % (11/19 0736) Weight:  [62.2 kg (137 lb 3.2 oz)] 62.2 kg (137 lb 3.2 oz) (11/19 0534)  Intake/Output from previous day: 11/18 0701 - 11/19 0700 In: 2035 [P.O.:240; I.V.:1795] Out: 1300 [Urine:1300] Intake/Output from this shift: Total I/O In: 240 [P.O.:240] Out: -   Physical Exam: Neck: no adenopathy, no carotid bruit, no JVD and supple, symmetrical, trachea midline Lungs: clear to auscultation bilaterally Heart: regular rate and rhythm, S1, S2 normal and 2/6 systolic murmur noted Abdomen: soft, non-tender; bowel sounds normal; no masses,  no organomegaly Extremities: extremities normal, atraumatic, no cyanosis or edema  Lab Results: Recent Labs    09/05/17 0703 09/06/17 0547  WBC 2.8* 3.0*  HGB 7.2* 7.0*  PLT 192 175   Recent Labs    09/05/17 0703 09/06/17 0547  NA 139 139  K 3.4* 3.9  CL 109 110  CO2 25 25  GLUCOSE 74 76  BUN 18 12  CREATININE 1.09* 1.07*   Recent Labs    09/05/17 0138 09/05/17 0703  TROPONINI <0.03 <0.03   Hepatic Function Panel Recent Labs    09/05/17 0703  PROT 6.9  ALBUMIN 3.0*  AST 21  ALT 9*  ALKPHOS 42  BILITOT 1.1   No results for input(s): CHOL in the last 72 hours. No results for input(s): PROTIME in the last 72 hours.  Imaging: Imaging results have been reviewed and Dg Chest 2 View  Result Date: 09/04/2017 CLINICAL DATA:  Confusion and altered mental status. EXAM: CHEST  2 VIEW COMPARISON:  Chest x-ray dated 09/26/2013. FINDINGS: Stable cardiomegaly. Atherosclerotic changes of  the ectatic thoracic aorta. Study is slightly hypoinspiratory. Given the low lung volumes, lungs appear clear. No pleural effusion or pneumothorax seen. No acute or suspicious osseous finding. IMPRESSION: No active cardiopulmonary disease. No evidence of pneumonia or pulmonary edema. Aortic atherosclerosis. Electronically Signed   By: Franki Cabot M.D.   On: 09/04/2017 16:47   Ct Head Wo Contrast  Result Date: 09/04/2017 CLINICAL DATA:  Confusion and altered mental status. Hypoxia and hypotension. EXAM: CT HEAD WITHOUT CONTRAST TECHNIQUE: Contiguous axial images were obtained from the base of the skull through the vertex without intravenous contrast. COMPARISON:  Head CT dated 09/26/2013 and brain MRI dated 09/27/2013. FINDINGS: Brain: There is mild generalized age related parenchymal atrophy with commensurate dilatation of the ventricles and sulci. Mild chronic small vessel ischemic changes are again noted within the bilateral periventricular white matter regions. There is no mass, hemorrhage, edema or other evidence of acute parenchymal abnormality. No extra-axial hemorrhage. Vascular: There are chronic calcified atherosclerotic changes of the large vessels at the skull base. No unexpected hyperdense vessel. Skull: Normal. Negative for fracture or focal lesion. Sinuses/Orbits: No acute finding. Other: None. IMPRESSION: 1. No acute findings.  No intracranial mass, hemorrhage or edema. 2. Mild chronic small vessel ischemic changes in the white matter. Electronically Signed   By: Franki Cabot M.D.   On: 09/04/2017 16:46    Cardiac Studies:  Assessment/Plan:  Status post syncope probably secondary  to orthostatic hypertension Coronary artery disease history of PCI to RCA in the past Minimally elevated troponin I point of care only doubt significant MI Hypertension Depression Normochromic anemia of exertional etiology Hypothyroidism Constipation Plan Continue present management Agree with packed  RBCs transfusion Will change losartan HCT to losartan 25 mg daily Check orthostatics Check labs in a.m. Rx for constipation  LOS: 0 days    Charolette Forward 09/06/2017, 11:31 AM

## 2017-09-06 NOTE — Progress Notes (Addendum)
PROGRESS NOTE    Wendy [name]  AOZ:[id number] DOB: 1916-12-20 DOA: 09/04/2017 PCP: Lajean Manes, MD   Chief Complaint  Patient presents with  . Weakness  . Near Syncope    Brief Narrative:  HPI on 09/04/2017 Wendy [name] is a 83 y.o. female with a medical history of hypertension, hypothyroidism, presented to the emergency department with complaints of altered mental status, confusion and passing out. Patient's daughter noted the patient had eaten impound her somewhat slumped over afterwards. EMS was called, upon arrival patient was found to be hypotensive and hypoxic. Blood pressure was 80/40, with oxygen level of 85. Patient was given 1 L of fluids in route to the hospital. Upon arrival she was noted to have a normal blood pressure and satting 100% on room air. Of note patient did have similar episode Tuesday of this past week. Currently she denies any chest pain, shortness breath, abdominal pain, nausea vomiting, diarrhea constipation, dizziness or headache, change in bowel pattern urinary frequency. Per patient's daughter, patient has had poor appetite and has been more tired lately. She states her mother's thyroid medication was changed recently.  Assessment & Plan   Syncope -Suspect orthostatic vs vasovagal as patient was found to have blood pressure in the 96E systolically upon EMS arrival (also med induced in the setting of poor oral intake) -Continue telemetry monitoring -UA/chest x-ray unremarkable for infection -Echocardiogram shows EF 60-65%, Grade II DD  -Cardiology on board: Rec transfusion with hx of CAD, d/c HCT on d/c -Patient did have a drop in BP when standing (+orthostatics) -PT and OT consulted  Elevated troponin -Likely secondary to the above -POC troponin on admission 0.14 -troponin cycled and negative -Echocardiogram as above -Cardiology on board  Frequent episodes of NSVT Noted on telemetry, pt asymptomatic, electrolytes WNL, will repeat in the  am HR in the 50s, will hold off on bblker for now Cardiology on board   Elevated lactic acid -Resolved -No source of infection, patient currently afebrile -As above, UA and chest x-ray unremarkable for infection -Likely secondary to syncope and hypovolemia -Was 3 on admission, currently down to 1.2  Essential hypertension -Continue to hold losartan/HCT -Will d/c HCT upon discharge -Continue to monitor closely  Depression -Continue Celexa  Hypothyroidism -Continue Synthroid  Normocytic anemia -On admission, Hemoglobin 7.9, possibly dilutional component as patient did receive IV fluids in route to the hospital. Labs back in 2014 show hemoglobin of 11 -Currently hemoglobin down to 7.0, could be dilutional -Transfusion witheld due to warm autoimmune AB found during type and screen -Will repeat CBC in am and consider hematology consult before transfusion -Anemia panel showed adequate iron and stores -FOBT pending -Continue to monitor CBC  Possible Acute kidney Injury Resolved -creatinine upon admission 1.24 -possibly elevated due to hypotension, dehydration, vs medications -labs in 2014- basline about 0.8 -S/p gentle IVF, will D/C -continue to monitor BMP  Hypokalemia -Continue to replace a and monitor BMP -magnesium 1.9  Transient hypoxia Resolved -patient was hypoxic at home prior to admission, no longer hypoxic -currently maintaining O2 saturations 100% on room air -continue to monitor  Hypothyroidism -Continue synthroid -TSH 2.403, 0.87 (WNL)  Generalized weakness - poor oral intake, nutrition consult placed -Vitamin D level pending  -TSH WNL -B12 470, WNL , however could be better, will give one injection of B12  -PT/OT consulted  DVT Prophylaxis  Lovenox held due to dropping hemoglobin  Code Status: Full  Family Communication: daughter and son at bedside  Disposition Plan: Observation, pending  repeat CBC in am  Consultants Cardiology, Dr.  Terrence Dupont  Procedures  None  Antibiotics   Anti-infectives (From admission, onward)   None      Subjective:   Wendy [name] seen and examined today. Patient feeling better today.  Still with poor oral intake. Patient denies dizziness, chest pain, shortness of breath, abdominal pain, N/V/D/C, new weakness, numbess, tingling.    Objective:   Vitals:   09/05/17 1702 09/05/17 1942 09/06/17 0534 09/06/17 0736  BP: 132/61 (!) 122/50 (!) 119/56 (!) 132/58  Pulse: (!) 54 61 (!) 55 (!) 55  Resp: 18 18 18 18   Temp: 98.2 F (36.8 C) 98.9 F (37.2 C) 98 F (36.7 C) 98.9 F (37.2 C)  TempSrc: Oral Oral Oral Oral  SpO2: 100% 100% 98% 99%  Weight:   62.2 kg (137 lb 3.2 oz)   Height:        Intake/Output Summary (Last 24 hours) at 09/06/2017 1141 Last data filed at 09/06/2017 8527 Gross per 24 hour  Intake 1565 ml  Output 800 ml  Net 765 ml   Filed Weights   09/04/17 1531 09/05/17 0500 09/06/17 0534  Weight: 64.4 kg (142 lb) 63.5 kg (140 lb 1.6 oz) 62.2 kg (137 lb 3.2 oz)    Exam  General: Well developed, elderly, NAD  HEENT: NCAT, mucous membranes moist.   Cardiovascular: S1 S2 auscultated, RRR, no murmur appreciated  Respiratory: Clear to auscultation bilaterally with equal chest rise  Abdomen: Soft, nontender, nondistended, + bowel sounds  Extremities: warm dry without cyanosis clubbing or edema  Neuro: AAOx3, nonfocal  Psych: Normal affect and demeanor, pleasant    Data Reviewed: I have personally reviewed following labs and imaging studies  CBC: Recent Labs  Lab 09/04/17 1533 09/04/17 2011 09/05/17 0703 09/06/17 0547  WBC 2.6* 3.7* 2.8* 3.0*  NEUTROABS  --  2.3  --   --   HGB 7.9* 8.0* 7.2* 7.0*  HCT 24.3* 24.4* 22.1* 20.7*  MCV 95.7 94.2 94.4 95.0  PLT 188 209 192 782   Basic Metabolic Panel: Recent Labs  Lab 09/04/17 1533 09/04/17 1915 09/04/17 2011 09/05/17 0703 09/06/17 0547  NA 137  --  138 139 139  K 3.0*  --  3.5 3.4* 3.9  CL 107   --  105 109 110  CO2 21*  --  27 25 25   GLUCOSE 108*  --  88 74 76  BUN 26*  --  22* 18 12  CREATININE 1.24*  --  1.18* 1.09* 1.07*  CALCIUM 8.4*  --  8.7* 8.4* 8.3*  MG  --  1.9  --   --   --    GFR: Estimated Creatinine Clearance: 25.4 mL/min (A) (by C-G formula based on SCr of 1.07 mg/dL (H)). Liver Function Tests: Recent Labs  Lab 09/04/17 2011 09/05/17 0703  AST 23 21  ALT 7* 9*  ALKPHOS 48 42  BILITOT 0.8 1.1  PROT 7.3 6.9  ALBUMIN 3.4* 3.0*   No results for input(s): LIPASE, AMYLASE in the last 168 hours. No results for input(s): AMMONIA in the last 168 hours. Coagulation Profile: Recent Labs  Lab 09/04/17 2011  INR 1.09   Cardiac Enzymes: Recent Labs  Lab 09/04/17 1915 09/05/17 0138 09/05/17 0703  TROPONINI <0.03 <0.03 <0.03   BNP (last 3 results) No results for input(s): PROBNP in the last 8760 hours. HbA1C: No results for input(s): HGBA1C in the last 72 hours. CBG: Recent Labs  Lab 09/05/17 0623  GLUCAP 70  Lipid Profile: No results for input(s): CHOL, HDL, LDLCALC, TRIG, CHOLHDL, LDLDIRECT in the last 72 hours. Thyroid Function Tests: Recent Labs    09/05/17 0138 09/05/17 0703  TSH 2.403  --   FREET4  --  0.87   Anemia Panel: Recent Labs    09/04/17 1915 09/04/17 2011 09/05/17 0138  VITAMINB12  --  470  --   FOLATE  --   --  19.5  FERRITIN  --   --  857*  TIBC  --   --  234*  IRON  --   --  53  RETICCTPCT 1.6  --   --    Urine analysis:    Component Value Date/Time   COLORURINE YELLOW 09/04/2017 1720   APPEARANCEUR CLEAR 09/04/2017 1720   LABSPEC 1.011 09/04/2017 1720   PHURINE 6.0 09/04/2017 1720   GLUCOSEU NEGATIVE 09/04/2017 1720   HGBUR NEGATIVE 09/04/2017 1720   BILIRUBINUR NEGATIVE 09/04/2017 1720   KETONESUR NEGATIVE 09/04/2017 1720   PROTEINUR NEGATIVE 09/04/2017 1720   UROBILINOGEN 0.2 09/28/2013 1136   NITRITE NEGATIVE 09/04/2017 1720   LEUKOCYTESUR NEGATIVE 09/04/2017 1720   Sepsis  Labs: @LABRCNTIP (procalcitonin:4,lacticidven:4)  )No results found for this or any previous visit (from the past 240 hour(s)).    Radiology Studies: Dg Chest 2 View  Result Date: 09/04/2017 CLINICAL DATA:  Confusion and altered mental status. EXAM: CHEST  2 VIEW COMPARISON:  Chest x-ray dated 09/26/2013. FINDINGS: Stable cardiomegaly. Atherosclerotic changes of the ectatic thoracic aorta. Study is slightly hypoinspiratory. Given the low lung volumes, lungs appear clear. No pleural effusion or pneumothorax seen. No acute or suspicious osseous finding. IMPRESSION: No active cardiopulmonary disease. No evidence of pneumonia or pulmonary edema. Aortic atherosclerosis. Electronically Signed   By: Franki Cabot M.D.   On: 09/04/2017 16:47   Ct Head Wo Contrast  Result Date: 09/04/2017 CLINICAL DATA:  Confusion and altered mental status. Hypoxia and hypotension. EXAM: CT HEAD WITHOUT CONTRAST TECHNIQUE: Contiguous axial images were obtained from the base of the skull through the vertex without intravenous contrast. COMPARISON:  Head CT dated 09/26/2013 and brain MRI dated 09/27/2013. FINDINGS: Brain: There is mild generalized age related parenchymal atrophy with commensurate dilatation of the ventricles and sulci. Mild chronic small vessel ischemic changes are again noted within the bilateral periventricular white matter regions. There is no mass, hemorrhage, edema or other evidence of acute parenchymal abnormality. No extra-axial hemorrhage. Vascular: There are chronic calcified atherosclerotic changes of the large vessels at the skull base. No unexpected hyperdense vessel. Skull: Normal. Negative for fracture or focal lesion. Sinuses/Orbits: No acute finding. Other: None. IMPRESSION: 1. No acute findings.  No intracranial mass, hemorrhage or edema. 2. Mild chronic small vessel ischemic changes in the white matter. Electronically Signed   By: Franki Cabot M.D.   On: 09/04/2017 16:46     Scheduled  Meds: . aspirin EC  81 mg Oral QPC supper  . citalopram  5 mg Oral QPC supper  . feeding supplement (ENSURE ENLIVE)  237 mL Oral BID BM  . levothyroxine  50 mcg Oral QAC breakfast  . polyethylene glycol  17 g Oral Daily  . potassium chloride  20 mEq Oral BID  . senna-docusate  1 tablet Oral BID  . sodium chloride flush  3 mL Intravenous Q12H   Continuous Infusions: . sodium chloride       LOS: 0 days   Time Spent in minutes   30 minutes  Alma Friendly on 09/06/2017 at 11:41 AM  Between 7am to 7pm - Pager - [phone]  After 7pm go to [email or web address] - password [email or web address]  And look for the night coverage person covering for me after hours  [facility] Office  [phone]

## 2017-09-06 NOTE — Progress Notes (Signed)
Pt was scheduled to receive 1 unit PBRCs,, however, this was discontinued d/t type and screen revealing that pt's blood had warm autoimmune antibodies.   Spoke with Dr. Horris Latino, informing her of same, and she stated that she would call and speak with daughter to inform her of same. Daughter later informs me that she did not hear from doctor and was angry with this nurse stating that I should have told her.   I would have no reason to inform her bec I was under the impression that the physician had called her.

## 2017-09-06 NOTE — Progress Notes (Signed)
Occupational Therapy Treatment Patient Details Name: KACIE HUXTABLE MRN: [id number] DOB: 08/20/1917 Today's Date: 09/06/2017    History of present illness 83 y.o. female with a medical history of hypertension, hypothyroidism, presented to the emergency department with complaints of altered mental status, confusion and passing out.   OT comments  Pt demonstrating progress toward OT goals. Challenged dynamic standing during grooming at sink with pt able to complete dynamic reaching tasks with min guard assist. She was able to complete multiple ADL tasks consecutively without rest breaks demonstrating improving activity tolerance. Pt would continue to benefit from OT services while admitted and will continue to follow. D/C recommendations remain appropriate.   Follow Up Recommendations  No OT follow up;Supervision/Assistance - 24 hour    Equipment Recommendations  Tub/shower seat    Recommendations for Other Services      Precautions / Restrictions Precautions Precautions: Fall Restrictions Weight Bearing Restrictions: No       Mobility Bed Mobility               General bed mobility comments: Sitting at EOB on my arrival.   Transfers Overall transfer level: Needs assistance Equipment used: None Transfers: Sit to/from Stand Sit to Stand: Min guard         General transfer comment: Min guard assist for safety.     Balance Overall balance assessment: Needs assistance Sitting-balance support: Feet supported;No upper extremity supported Sitting balance-Leahy Scale: Good     Standing balance support: No upper extremity supported;During functional activity Standing balance-Leahy Scale: Fair Standing balance comment: Up to min guard assist with dynamic reaching and functional mobility tasks.                            ADL either performed or assessed with clinical judgement   ADL Overall ADL's : Needs assistance/impaired     Grooming:  Supervision/safety;Bed level;Oral care Grooming Details (indicate cue type and reason): VC's to sequence and problem solve.                  Toilet Transfer: Min guard;Ambulation           Functional mobility during ADLs: Min guard General ADL Comments: Pt able to demonstrate improving activity tolerance as she was able to complete multiple consecutive grooming tasks and functional mobility without rest breaks.      Vision   Vision Assessment?: No apparent visual deficits   Perception     Praxis      Cognition Arousal/Alertness: Awake/alert Behavior During Therapy: WFL for tasks assessed/performed Overall Cognitive Status: History of cognitive impairments - at baseline                                 General Comments: Daughter present during session. Pt with some problem solving and memory deficits noted during grooming tasks impacting her ability to complete novel tasks.         Exercises Exercises: Other exercises Other Exercises Other Exercises: Facilitated improved dynamic standing balance with functional dynamic reaching tasks to reach for self-care items.    Shoulder Instructions       General Comments      Pertinent Vitals/ Pain       Pain Assessment: No/denies pain  Home Living  Prior Functioning/Environment              Frequency  Min 2X/week        Progress Toward Goals  OT Goals(current goals can now be found in the care plan section)  Progress towards OT goals: Progressing toward goals  Acute Rehab OT Goals Patient Stated Goal: return home OT Goal Formulation: With patient/family Time For Goal Achievement: 09/19/17 Potential to Achieve Goals: Good  Plan Discharge plan remains appropriate    Co-evaluation                 AM-PAC PT "6 Clicks" Daily Activity     Outcome Measure   Help from another person eating meals?: A Little Help from another  person taking care of personal grooming?: A Little Help from another person toileting, which includes using toliet, bedpan, or urinal?: A Little Help from another person bathing (including washing, rinsing, drying)?: A Little Help from another person to put on and taking off regular upper body clothing?: A Little Help from another person to put on and taking off regular lower body clothing?: A Little 6 Click Score: 18    End of Session Equipment Utilized During Treatment: Gait belt  OT Visit Diagnosis: Unsteadiness on feet (R26.81)   Activity Tolerance Patient tolerated treatment well   Patient Left with call bell/phone within reach;with family/visitor present;in bed(sitting at EOB with daughter present)   Nurse Communication Mobility status    Functional Assessment Tool Used: Clinical judgement Functional Limitation: Self care Self Care Current Status (E4235): At least 1 percent but less than 20 percent impaired, limited or restricted Self Care Goal Status (T6144): At least 1 percent but less than 20 percent impaired, limited or restricted   Time: 3154-0086 OT Time Calculation (min): 15 min  Charges: OT G-codes **NOT FOR INPATIENT CLASS** Functional Assessment Tool Used: Clinical judgement Functional Limitation: Self care Self Care Current Status (P6195): At least 1 percent but less than 20 percent impaired, limited or restricted Self Care Goal Status (K9326): At least 1 percent but less than 20 percent impaired, limited or restricted OT General Charges $OT Visit: 1 Visit OT Treatments $Self Care/Home Management : 8-22 mins  Norman Herrlich, MS OTR/L  Pager: [place] 09/06/2017, 9:15 AM

## 2017-09-07 DIAGNOSIS — E039 Hypothyroidism, unspecified: Secondary | ICD-10-CM | POA: Diagnosis not present

## 2017-09-07 DIAGNOSIS — R55 Syncope and collapse: Secondary | ICD-10-CM | POA: Diagnosis not present

## 2017-09-07 DIAGNOSIS — I10 Essential (primary) hypertension: Secondary | ICD-10-CM | POA: Diagnosis not present

## 2017-09-07 DIAGNOSIS — F329 Major depressive disorder, single episode, unspecified: Secondary | ICD-10-CM | POA: Diagnosis not present

## 2017-09-07 LAB — BASIC METABOLIC PANEL
ANION GAP: 4 — AB (ref 5–15)
BUN: 11 mg/dL (ref 6–20)
CALCIUM: 8.7 mg/dL — AB (ref 8.9–10.3)
CO2: 26 mmol/L (ref 22–32)
Chloride: 107 mmol/L (ref 101–111)
Creatinine, Ser: 1.05 mg/dL — ABNORMAL HIGH (ref 0.44–1.00)
GFR, EST AFRICAN AMERICAN: 50 mL/min — AB (ref >60)
GFR, EST NON AFRICAN AMERICAN: 43 mL/min — AB (ref >60)
GLUCOSE: 80 mg/dL (ref 65–99)
Potassium: 4.5 mmol/L (ref 3.5–5.1)
SODIUM: 137 mmol/L (ref 135–145)

## 2017-09-07 LAB — MAGNESIUM: Magnesium: 2 mg/dL (ref 1.7–2.4)

## 2017-09-07 LAB — GLUCOSE, CAPILLARY: Glucose-Capillary: 81 mg/dL (ref 65–99)

## 2017-09-07 LAB — CBC WITH DIFFERENTIAL/PLATELET
BASOS ABS: 0 10*3/uL (ref 0.0–0.1)
BASOS PCT: 1 %
Eosinophils Absolute: 0.1 10*3/uL (ref 0.0–0.7)
Eosinophils Relative: 2 %
HEMATOCRIT: 22.7 % — AB (ref 36.0–46.0)
HEMOGLOBIN: 7.5 g/dL — AB (ref 12.0–15.0)
LYMPHS PCT: 42 %
Lymphs Abs: 1.4 10*3/uL (ref 0.7–4.0)
MCH: 31.4 pg (ref 26.0–34.0)
MCHC: 33 g/dL (ref 30.0–36.0)
MCV: 95 fL (ref 78.0–100.0)
MONO ABS: 0.4 10*3/uL (ref 0.1–1.0)
MONOS PCT: 12 %
NEUTROS ABS: 1.5 10*3/uL — AB (ref 1.7–7.7)
NEUTROS PCT: 43 %
Platelets: 181 10*3/uL (ref 150–400)
RBC: 2.39 MIL/uL — ABNORMAL LOW (ref 3.87–5.11)
RDW: 15.9 % — AB (ref 11.5–15.5)
WBC: 3.4 10*3/uL — ABNORMAL LOW (ref 4.0–10.5)

## 2017-09-07 LAB — RETICULOCYTES
RBC.: 2.41 MIL/uL — ABNORMAL LOW (ref 3.87–5.11)
Retic Count, Absolute: 45.8 10*3/uL (ref 19.0–186.0)
Retic Ct Pct: 1.9 % (ref 0.4–3.1)

## 2017-09-07 LAB — PHOSPHORUS: PHOSPHORUS: 2.3 mg/dL — AB (ref 2.5–4.6)

## 2017-09-07 LAB — HEPATIC FUNCTION PANEL
ALK PHOS: 43 U/L (ref 38–126)
ALT: 11 U/L — AB (ref 14–54)
AST: 24 U/L (ref 15–41)
Albumin: 3 g/dL — ABNORMAL LOW (ref 3.5–5.0)
Total Bilirubin: 0.7 mg/dL (ref 0.3–1.2)
Total Protein: 6.5 g/dL (ref 6.5–8.1)

## 2017-09-07 LAB — C-REACTIVE PROTEIN: CRP: <0.8 mg/dL (ref <1.0)

## 2017-09-07 LAB — LACTATE DEHYDROGENASE: LDH: 188 U/L (ref 98–192)

## 2017-09-07 LAB — SEDIMENTATION RATE: Sed Rate: 138 mm/hr — ABNORMAL HIGH (ref 0–22)

## 2017-09-07 MED ORDER — LOSARTAN POTASSIUM 25 MG PO TABS: 25.0000 mg | ORAL_TABLET | Freq: Every day | ORAL | 0 refills | Status: DC

## 2017-09-07 NOTE — Progress Notes (Signed)
Subjective:  Doing well eating breakfast denies any chest pain or shortness of breath.  Objective:  Vital Signs in the last 24 hours: Temp:  [98.2 F (36.8 C)-99.3 F (37.4 C)] 98.3 F (36.8 C) (11/20 0410) Pulse Rate:  [61-95] 61 (11/20 0410) Resp:  [18] 18 (11/19 1134) BP: (133-155)/(63-71) 133/63 (11/20 0410) SpO2:  [97 %-100 %] 99 % (11/20 0410) Weight:  [61.8 kg (136 lb 4.8 oz)] 61.8 kg (136 lb 4.8 oz) (11/20 0410)  Intake/Output from previous day: 11/19 0701 - 11/20 0700 In: 1132 [P.O.:1132] Out: 900 [Urine:900] Intake/Output from this shift: Total I/O In: 480 [P.O.:480] Out: -   Physical Exam: Neck: no adenopathy, no carotid bruit, no JVD and supple, symmetrical, trachea midline Lungs: clear to auscultation bilaterally Heart: regular rate and rhythm, S1, S2 normal and 2/6 systolic murmur noted Abdomen: soft, non-tender; bowel sounds normal; no masses,  no organomegaly Extremities: extremities normal, atraumatic, no cyanosis or edema  Lab Results: Recent Labs    09/06/17 0547 09/07/17 0541  WBC 3.0* 3.4*  HGB 7.0* 7.5*  PLT 175 181   Recent Labs    09/06/17 0547 09/07/17 0541  NA 139 137  K 3.9 4.5  CL 110 107  CO2 25 26  GLUCOSE 76 80  BUN 12 11  CREATININE 1.07* 1.05*   Recent Labs    09/05/17 0138 09/05/17 0703  TROPONINI <0.03 <0.03   Hepatic Function Panel Recent Labs    09/07/17 0541  PROT 6.5  ALBUMIN 3.0*  AST 24  ALT 11*  ALKPHOS 43  BILITOT 0.7  BILIDIR <0.1*  IBILI NOT CALCULATED   No results for input(s): CHOL in the last 72 hours. No results for input(s): PROTIME in the last 72 hours.  Imaging: Imaging results have been reviewed and No results found.  Cardiac Studies:  Assessment/Plan:  Status post syncope probably secondary to orthostatic hypotension Coronary artery disease history of PCI to RCA in the past Minimally elevated troponin I point of care only doubt significant MI Hypertension Depression Normochromic  anemia of questionable etiology Hypothyroidism Constipation Plan Continue present management Increase ambulation as tolerated    LOS: 0 days    Charolette Forward 09/07/2017, 10:14 AM

## 2017-09-07 NOTE — Progress Notes (Signed)
Physical Therapy Treatment Patient Details Name: Wendy [name] MRN: [id number] DOB: 1916/12/19 Today's Date: 09/07/2017    History of Present Illness 83 y.o. female with a medical history of hypertension, hypothyroidism, presented to the emergency department with complaints of altered mental status, confusion and passing out.    PT Comments    Pt motivated to participate in therapy. Min guard- supervision for safety during transfers no vc needed. Steady with ambulation. Supervision-Min guard for safety during gait and stairs. Reports of fatigue toward end of session. Better understanding with one word commands with seated exercises. Pt progressing well. Current plan remains appropriate    Follow Up Recommendations  No PT follow up;Supervision/Assistance - 24 hour     Equipment Recommendations  None recommended by PT    Recommendations for Other Services       Precautions / Restrictions Precautions Precautions: Fall Restrictions Weight Bearing Restrictions: No    Mobility  Bed Mobility Overal bed mobility: Modified Independent             General bed mobility comments: assistance with moving covers but no physical assistance needed for supine to EOB transition  Transfers Overall transfer level: Needs assistance Equipment used: None Transfers: Sit to/from Stand Sit to Stand: Min guard;Supervision         General transfer comment: supervision-Min guard assist for safety. Performed with proper foot and hand placement   Ambulation/Gait Ambulation/Gait assistance: Supervision;Min guard Ambulation Distance (Feet): 460 Feet Assistive device: None Gait Pattern/deviations: Step-through pattern;Decreased stride length;Narrow base of support Gait velocity: decreased   General Gait Details: pt steady with ambulation. min guard for safety. Pt very motivated to walk. min vc to increase stride length   Stairs Stairs: Yes   Stair Management: One rail Right Number of  Stairs: 10 General stair comments: no difficulty, supervision-min guard for safety Min VC to turn when turning around to descend stairs  Wheelchair Mobility    Modified Rankin (Stroke Patients Only)       Balance Overall balance assessment: Needs assistance Sitting-balance support: Feet supported;No upper extremity supported Sitting balance-Leahy Scale: Good     Standing balance support: No upper extremity supported;During functional activity Standing balance-Leahy Scale: Good Standing balance comment: pt min guard assist for safety with standing no UE support                            Cognition Arousal/Alertness: Awake/alert Behavior During Therapy: WFL for tasks assessed/performed Overall Cognitive Status: History of cognitive impairments - at baseline                                        Exercises General Exercises - Lower Extremity Ankle Circles/Pumps: AROM;Both;10 reps;Seated Long Arc Quad: AROM;Both;10 reps;Seated Hip Flexion/Marching: AROM;Both;10 reps;Seated Heel Raises: AROM;Both;10 reps;Seated Other Exercises Other Exercises: BLE towel squeeze 10 reps seated 5 sec holds    General Comments        Pertinent Vitals/Pain Pain Assessment: No/denies pain    Home Living                      Prior Function            PT Goals (current goals can now be found in the care plan section) Acute Rehab PT Goals Patient Stated Goal: not discussed Progress towards PT goals: Progressing toward goals  Frequency    Min 3X/week      PT Plan Current plan remains appropriate    Co-evaluation              AM-PAC PT "6 Clicks" Daily Activity  Outcome Measure  Difficulty turning over in bed (including adjusting bedclothes, sheets and blankets)?: A Little Difficulty moving from lying on back to sitting on the side of the bed? : A Little Difficulty sitting down on and standing up from a chair with arms (e.g.,  wheelchair, bedside commode, etc,.)?: A Little Help needed moving to and from a bed to chair (including a wheelchair)?: A Little Help needed walking in hospital room?: A Little Help needed climbing 3-5 steps with a railing? : A Little 6 Click Score: 18    End of Session Equipment Utilized During Treatment: Gait belt Activity Tolerance: Patient tolerated treatment well Patient left: in chair;with call bell/phone within reach;with family/visitor present Nurse Communication: Mobility status PT Visit Diagnosis: Unsteadiness on feet (R26.81)     Time: 5329-9242 PT Time Calculation (min) (ACUTE ONLY): 28 min  Charges:  $Gait Training: 8-22 mins $Therapeutic Activity: 8-22 mins                    G Codes:  Functional Assessment Tool Used: AM-PAC 6 Clicks Basic Mobility    Fransisca Connors, [place] 09/07/2017, 4:08 PM

## 2017-09-07 NOTE — Plan of Care (Signed)
  Education: Knowledge of General Education information will improve 09/07/2017 0108 - Progressing by Tristan Schroeder, RN   Health Behavior/Discharge Planning: Ability to manage health-related needs will improve 09/07/2017 0108 - Progressing by Tristan Schroeder, RN   Activity: Risk for activity intolerance will decrease 09/07/2017 0108 - Progressing by Tristan Schroeder, RN   Safety: Ability to remain free from injury will improve 09/07/2017 0108 - Progressing by Tristan Schroeder, RN

## 2017-09-07 NOTE — Discharge Summary (Signed)
Discharge Summary  Wendy [name] VFI:[id number] DOB: April 02, 1917  PCP: Lajean Manes, MD  Admit date: 09/04/2017 Discharge date: 09/07/2017  Time spent: >30 mins  Recommendations for Outpatient Follow-up:  1. PCP 2. Cardiology   Discharge Diagnoses:  Active Hospital Problems   Diagnosis Date Noted  . Syncope 09/28/2013  . Hypothyroidism 09/26/2013  . HTN (hypertension) 09/26/2013  . Depression 09/26/2013    Resolved Hospital Problems  No resolved problems to display.    Discharge Condition: Stable  Diet recommendation: Regular  Vitals:   09/06/17 2148 09/07/17 0410  BP: (!) 146/66 133/63  Pulse: 63 61  Resp:    Temp: 99.3 F (37.4 C) 98.3 F (36.8 C)  SpO2: 97% 99%    History of present illness:  Wendy [name] a 83 y.o.femalewith a medical history ofhypertension, hypothyroidism, presented to the emergency department with complaints of altered mental status, confusion and passing out. Patient's daughter noted the patient had eaten impound her somewhat slumped over afterwards. EMS was called, upon arrival patient was found to be hypotensive and hypoxic. Blood pressure was 80/40, with oxygen level of 85. Patient was given 1 L of fluids in route to the hospital. Upon arrival she was noted to have a normal blood pressure and satting 100% on room air. Of note patient did have similar episode Tuesday of this past week. Currently she denies any chest pain, shortness breath, abdominal pain, nausea vomiting, diarrhea constipation, dizziness or headache, change in bowel pattern urinary frequency.Per patient's daughter, patient has had poor appetite and has been more tired lately. She states her mother's thyroid medication was changed recently.  Today, pt noted to be stable for discharge, denied any new complaints, no chest pain, worsening SOB or dizziness upon standing, no abdominal pain.   Hospital Course:  Active Problems:   Hypothyroidism   Depression   HTN  (hypertension)   Syncope  Syncope likely 2/2 orthostatic in the setting of poor oral intake Resolved -Suspect orthostatic vs vasovagal as patient was found to have blood pressure in the 41Y systolically upon EMS arrival (also med induced in the setting of poor oral intake) -UA/chest x-ray unremarkable for infection -Echocardiogram shows EF 60-65%, Grade II DD  -Cardiology on board: d/c HCT on discharge and reducing dose of losartan from 100mg  to 25mg  -Patient did have a drop in BP when standing (+orthostatics) -PT and OT consulted, no further needs   Elevated troponin -Likely secondary to the above -POC troponin on admission 0.14 -troponin cycled and negative -Echocardiogram as above -Cardiology on board, no further work-up. Follow as an outpt  Frequent episodes of NSVT Noted on telemetry, pt asymptomatic, electrolytes WNL HR in the 50s, no need for BB Cardiology on board, outpt follow up  Elevated lactic acid -Resolved -No source of infection, patient currently afebrile -As above, UA and chest x-ray unremarkable for infection -Likely secondary to syncope and hypovolemia -Was 3 on admission, currently down to 1.2  Essential hypertension -Discontinue HCT 25/losartan 100 and keep pt on low dose losartan 25 mg daily -PCP to follow up  Depression -Continue Celexa  Hypothyroidism -Continue Synthroid  Normocytic anemia -On admission, Hemoglobin 7.5, possibly dilutional component as patient did receive IV fluids in route to the hospital. Labs back in 2014 show hemoglobin of 11 -Transfusion witheld due to warm autoimmune AB found during type and screen -Can consider hematology consult as an outpt -Anemia panel showed adequate iron and stores -FOBT never done as sample was missed -PCP to follow CBC closely  Acute kidney Injury Resolved -creatinine upon admission 1.24 -possibly elevated due to hypotension, dehydration, vs medications -labs in 2014- basline about  0.8 -S/p gentle IVF -PCP to monitor BMP  Hypokalemia Resolved -magnesium 1.9  Transient hypoxia Resolved -patient was hypoxic at home prior to admission, no longer hypoxic -currently maintaining O2 saturations 100% on room air -continue to monitor  Hypothyroidism -Continue synthroid -TSH 2.403, 0.87 (WNL)  Generalized weakness - poor oral intake, nutrition consult placed -Vitamin D level pendind -TSH WNL -B12 470, WNL , however could be better, s/p one injection of B12  -PT/OT consult, no further needs   Procedures: None   Consultations: Cardiology   Discharge Exam: BP 133/63 (BP Location: Left Arm)   Pulse 61   Temp 98.3 F (36.8 C) (Oral)   Resp 18   Ht 5\' 2"  (1.575 m)   Wt 61.8 kg (136 lb 4.8 oz)   SpO2 99%   BMI 24.93 kg/m   General: Alert, awake, oriented X3 Cardiovascular: S1-S2 present, no added hrt sound Respiratory: Chest clear bilaterally  Discharge Instructions You were cared for by a hospitalist during your hospital stay. If you have any questions about your discharge medications or the care you received while you were in the hospital after you are discharged, you can call the unit and asked to speak with the hospitalist on call if the hospitalist that took care of you is not available. Once you are discharged, your primary care physician will handle any further medical issues. Please note that NO REFILLS for any discharge medications will be authorized once you are discharged, as it is imperative that you return to your primary care physician (or establish a relationship with a primary care physician if you do not have one) for your aftercare needs so that they can reassess your need for medications and monitor your lab values.  Discharge Instructions    Diet - low sodium heart healthy   Complete by:  As directed    Increase activity slowly   Complete by:  As directed      Allergies as of 09/07/2017   No Known Allergies     Medication List     STOP taking these medications   guaiFENesin 600 MG 12 hr tablet Commonly known as:  MUCINEX   losartan-hydrochlorothiazide 100-25 MG tablet Commonly known as:  HYZAAR   potassium chloride 8 MEQ tablet Commonly known as:  KLOR-CON     TAKE these medications   aspirin EC 81 MG tablet Take 81 mg daily after supper by mouth.   citalopram 10 MG tablet Commonly known as:  CELEXA Take 5 mg daily after supper by mouth.   ENSURE Take 237 mLs by mouth 2 (two) times daily between meals.   hydrocortisone cream 1 % Apply to affected area 2 times daily   levothyroxine 50 MCG tablet Commonly known as:  SYNTHROID, LEVOTHROID Take 50 mcg daily before breakfast by mouth.   losartan 25 MG tablet Commonly known as:  COZAAR Take 1 tablet (25 mg total) by mouth daily.   triamcinolone 0.025 % cream Commonly known as:  KENALOG Apply 1 application 2 (two) times daily as needed topically (rash).   Vitamin D (Ergocalciferol) 50000 units Caps capsule Commonly known as:  DRISDOL Take 50,000 Units every 7 (seven) days by mouth.      No Known Allergies Follow-up Information    Charolette Forward, MD. Schedule an appointment as soon as possible for a visit in 2 week(s).   Specialty:  Cardiology Contact information: [address] [place] [address] [phone]        Lajean Manes, MD. Go on 09/17/2017.   Specialty:  Internal Medicine Why:  @10 :45am Contact information: [address] [place] [address] [phone]            The results of significant diagnostics from this hospitalization (including imaging, microbiology, ancillary and laboratory) are listed below for reference.    Significant Diagnostic Studies: Dg Chest 2 View  Result Date: 09/04/2017 CLINICAL DATA:  Confusion and altered mental status. EXAM: CHEST  2 VIEW COMPARISON:  Chest x-ray dated 09/26/2013. FINDINGS: Stable cardiomegaly. Atherosclerotic changes of the  ectatic thoracic aorta. Study is slightly hypoinspiratory. Given the low lung volumes, lungs appear clear. No pleural effusion or pneumothorax seen. No acute or suspicious osseous finding. IMPRESSION: No active cardiopulmonary disease. No evidence of pneumonia or pulmonary edema. Aortic atherosclerosis. Electronically Signed   By: Franki Cabot M.D.   On: 09/04/2017 16:47   Ct Head Wo Contrast  Result Date: 09/04/2017 CLINICAL DATA:  Confusion and altered mental status. Hypoxia and hypotension. EXAM: CT HEAD WITHOUT CONTRAST TECHNIQUE: Contiguous axial images were obtained from the base of the skull through the vertex without intravenous contrast. COMPARISON:  Head CT dated 09/26/2013 and brain MRI dated 09/27/2013. FINDINGS: Brain: There is mild generalized age related parenchymal atrophy with commensurate dilatation of the ventricles and sulci. Mild chronic small vessel ischemic changes are again noted within the bilateral periventricular white matter regions. There is no mass, hemorrhage, edema or other evidence of acute parenchymal abnormality. No extra-axial hemorrhage. Vascular: There are chronic calcified atherosclerotic changes of the large vessels at the skull base. No unexpected hyperdense vessel. Skull: Normal. Negative for fracture or focal lesion. Sinuses/Orbits: No acute finding. Other: None. IMPRESSION: 1. No acute findings.  No intracranial mass, hemorrhage or edema. 2. Mild chronic small vessel ischemic changes in the white matter. Electronically Signed   By: Franki Cabot M.D.   On: 09/04/2017 16:46    Microbiology: No results found for this or any previous visit (from the past 240 hour(s)).   Labs: Basic Metabolic Panel: Recent Labs  Lab 09/04/17 1533 09/04/17 1915 09/04/17 2011 09/05/17 0703 09/06/17 0547 09/07/17 0541  NA 137  --  138 139 139 137  K 3.0*  --  3.5 3.4* 3.9 4.5  CL 107  --  105 109 110 107  CO2 21*  --  27 25 25 26   GLUCOSE 108*  --  88 74 76 80  BUN 26*   --  22* 18 12 11   CREATININE 1.24*  --  1.18* 1.09* 1.07* 1.05*  CALCIUM 8.4*  --  8.7* 8.4* 8.3* 8.7*  MG  --  1.9  --   --   --  2.0  PHOS  --   --   --   --   --  2.3*   Liver Function Tests: Recent Labs  Lab 09/04/17 2011 09/05/17 0703 09/07/17 0541  AST 23 21 24   ALT 7* 9* 11*  ALKPHOS 48 42 43  BILITOT 0.8 1.1 0.7  PROT 7.3 6.9 6.5  ALBUMIN 3.4* 3.0* 3.0*   No results for input(s): LIPASE, AMYLASE in the last 168 hours. No results for input(s): AMMONIA in the last 168 hours. CBC: Recent Labs  Lab 09/04/17 1533 09/04/17 2011 09/05/17 0703 09/06/17 0547 09/07/17 0541  WBC 2.6* 3.7* 2.8* 3.0* 3.4*  NEUTROABS  --  2.3  --   --  1.5*  HGB 7.9* 8.0* 7.2* 7.0* 7.5*  HCT 24.3* 24.4* 22.1* 20.7* 22.7*  MCV 95.7 94.2 94.4 95.0 95.0  PLT 188 209 192 175 181   Cardiac Enzymes: Recent Labs  Lab 09/04/17 1915 09/05/17 0138 09/05/17 0703  TROPONINI <0.03 <0.03 <0.03   BNP: BNP (last 3 results) No results for input(s): BNP in the last 8760 hours.  ProBNP (last 3 results) No results for input(s): PROBNP in the last 8760 hours.  CBG: Recent Labs  Lab 09/05/17 0623 09/07/17 0512  GLUCAP 70 81       Signed:  Alma Friendly, MD [facility] 09/07/2017, 7:12 PM

## 2017-09-07 NOTE — Care Management Obs Status (Signed)
[facility] NOTIFICATION   Patient Details  Name: LACOYA WILBANKS MRN: [id number] Date of Birth: 1917-10-03   Medicare Observation Status Notification Given:  Yes    Carles Collet, RN 09/07/2017, 9:42 AM

## 2017-09-08 LAB — TYPE AND SCREEN
ABO/RH(D): O POS
ANTIBODY SCREEN: POSITIVE
DAT, IgG: POSITIVE
UNIT DIVISION: 0
Unit division: 0

## 2017-09-08 LAB — HAPTOGLOBIN: Haptoglobin: 54 mg/dL (ref 34–200)

## 2017-09-08 LAB — BPAM RBC
BLOOD PRODUCT EXPIRATION DATE: 201812112359
Blood Product Expiration Date: 201812112359
UNIT TYPE AND RH: 5100
Unit Type and Rh: 5100

## 2017-09-08 LAB — ANA W/REFLEX IF POSITIVE: Anti Nuclear Antibody(ANA): NEGATIVE

## 2017-09-29 ENCOUNTER — Encounter: Payer: Medicare Other | Admitting: Hematology

## 2017-09-30 ENCOUNTER — Encounter: Payer: Medicare Other | Admitting: Hematology

## 2017-10-06 ENCOUNTER — Telehealth: Payer: Self-pay | Admitting: Hematology

## 2017-10-06 NOTE — Telephone Encounter (Signed)
Left message for patient regarding appointment with date/time/location & phone number

## 2017-10-28 ENCOUNTER — Telehealth: Payer: Self-pay | Admitting: Hematology

## 2017-10-28 ENCOUNTER — Encounter: Payer: Self-pay | Admitting: Hematology

## 2017-10-28 ENCOUNTER — Inpatient Hospital Stay: Payer: Medicare Other

## 2017-10-28 ENCOUNTER — Inpatient Hospital Stay: Payer: Medicare Other | Attending: Hematology | Admitting: Hematology

## 2017-10-28 VITALS — BP 171/80 | HR 69 | Temp 97.6°F | Resp 18 | Ht 62.0 in | Wt 138.5 lb

## 2017-10-28 DIAGNOSIS — E039 Hypothyroidism, unspecified: Secondary | ICD-10-CM | POA: Insufficient documentation

## 2017-10-28 DIAGNOSIS — D539 Nutritional anemia, unspecified: Secondary | ICD-10-CM | POA: Insufficient documentation

## 2017-10-28 DIAGNOSIS — Z7982 Long term (current) use of aspirin: Secondary | ICD-10-CM | POA: Insufficient documentation

## 2017-10-28 DIAGNOSIS — R740 Nonspecific elevation of levels of transaminase and lactic acid dehydrogenase [LDH]: Secondary | ICD-10-CM

## 2017-10-28 DIAGNOSIS — R718 Other abnormality of red blood cells: Secondary | ICD-10-CM

## 2017-10-28 DIAGNOSIS — R700 Elevated erythrocyte sedimentation rate: Secondary | ICD-10-CM | POA: Diagnosis not present

## 2017-10-28 DIAGNOSIS — R7402 Elevation of levels of lactic acid dehydrogenase (LDH): Secondary | ICD-10-CM

## 2017-10-28 DIAGNOSIS — I10 Essential (primary) hypertension: Secondary | ICD-10-CM | POA: Insufficient documentation

## 2017-10-28 DIAGNOSIS — R946 Abnormal results of thyroid function studies: Secondary | ICD-10-CM | POA: Diagnosis not present

## 2017-10-28 DIAGNOSIS — R768 Other specified abnormal immunological findings in serum: Secondary | ICD-10-CM

## 2017-10-28 DIAGNOSIS — D72819 Decreased white blood cell count, unspecified: Secondary | ICD-10-CM | POA: Insufficient documentation

## 2017-10-28 DIAGNOSIS — F329 Major depressive disorder, single episode, unspecified: Secondary | ICD-10-CM | POA: Diagnosis not present

## 2017-10-28 DIAGNOSIS — Z79899 Other long term (current) drug therapy: Secondary | ICD-10-CM | POA: Insufficient documentation

## 2017-10-28 LAB — CMP (CANCER CENTER ONLY)
ALBUMIN: 3.9 g/dL (ref 3.5–5.0)
ALK PHOS: 66 U/L (ref 40–150)
ALT: 11 U/L (ref 0–55)
ANION GAP: 9 (ref 3–11)
AST: 25 U/L (ref 5–34)
BILIRUBIN TOTAL: 0.8 mg/dL (ref 0.2–1.2)
BUN: 15 mg/dL (ref 7–26)
CALCIUM: 9.5 mg/dL (ref 8.4–10.4)
CO2: 26 mmol/L (ref 22–29)
Chloride: 104 mmol/L (ref 98–109)
Creatinine: 1.15 mg/dL — ABNORMAL HIGH (ref 0.60–1.10)
GFR, Est AFR Am: 44 mL/min — ABNORMAL LOW (ref >60)
GFR, Estimated: 38 mL/min — ABNORMAL LOW (ref >60)
GLUCOSE: 84 mg/dL (ref 70–140)
Potassium: 4.1 mmol/L (ref 3.3–4.7)
SODIUM: 139 mmol/L (ref 136–145)
TOTAL PROTEIN: 8.8 g/dL — AB (ref 6.4–8.3)

## 2017-10-28 LAB — CBC WITH DIFFERENTIAL (CANCER CENTER ONLY)
BASOS ABS: 0 10*3/uL (ref 0.0–0.1)
BASOS PCT: 1 %
EOS PCT: 1 %
Eosinophils Absolute: 0 10*3/uL (ref 0.0–0.5)
HEMATOCRIT: 29 % — AB (ref 34.8–46.6)
Hemoglobin: 9.5 g/dL — ABNORMAL LOW (ref 11.6–15.9)
Lymphocytes Relative: 34 %
Lymphs Abs: 0.9 10*3/uL (ref 0.9–3.3)
MCH: 32.2 pg (ref 25.1–34.0)
MCHC: 32.8 g/dL (ref 31.5–36.0)
MCV: 98.3 fL (ref 79.5–101.0)
MONO ABS: 0.2 10*3/uL (ref 0.1–0.9)
MONOS PCT: 8 %
NEUTROS ABS: 1.5 10*3/uL (ref 1.5–6.5)
Neutrophils Relative %: 56 %
PLATELETS: 214 10*3/uL (ref 145–400)
RBC: 2.95 MIL/uL — ABNORMAL LOW (ref 3.70–5.45)
RDW: 16.1 % (ref 11.2–16.1)
WBC Count: 2.7 10*3/uL — ABNORMAL LOW (ref 3.9–10.3)

## 2017-10-28 LAB — VITAMIN B12: Vitamin B-12: 522 pg/mL (ref 180–914)

## 2017-10-28 LAB — RETICULOCYTES
RBC.: 2.95 MIL/uL — AB (ref 3.70–5.45)
Retic Count, Absolute: 67.9 10*3/uL (ref 33.7–90.7)
Retic Ct Pct: 2.3 % — ABNORMAL HIGH (ref 0.7–2.1)

## 2017-10-28 LAB — LACTATE DEHYDROGENASE: LDH: 297 U/L — ABNORMAL HIGH (ref 125–245)

## 2017-10-28 NOTE — Telephone Encounter (Signed)
Scheduled appt per 1/10 los - Gave patient AVS and calender per los.  

## 2017-10-28 NOTE — Progress Notes (Signed)
10/28/17  CONSULT NOTE  Patient Care Team: Lajean Manes, MD as PCP - General (Internal Medicine)  CHIEF COMPLAINTS/PURPOSE OF CONSULTATION:  Anemia  HISTORY OF PRESENTING ILLNESS:   Wendy [name] 84 y.o. female is here because of worsening anemia.  Patient has a history of hypertension and hypothyroidism who as per her daughter has been a very active person until recently. Her daughter notes that patient's levothyroxine dose was reduced from 75 mcg to 50 mcg daily and this produced a significant change in the patient's cognition and energy levels.  She reports that it is very unusual for the patient to wake up late and then sleep throughout the day as she is currently doing. Per the patient's daughter she was a very active person, who would do a lot of walking, including walking around the track.   She notes that the patient has been slowly declining over the past year ever since seeing a new provider who started decreasing her thyroid medication. The patient's appetite has been declining as well.    She was seen in the ED for two syncopal episodes. On 09/04/2018 .  She was noted to be hypotensive and hypoxic.  Symptoms were thought to be related to dehydration due to poor oral intake.  Echo showed normal ejection fraction of 60-65% with grade 2 diastolic dysfunction.  Her losartan dose was significantly reduced on discharge.  She was noted to have several episodes of nonsustained VT but her heart rate was in the 50s.  Hemoglobin was noted to be between 7 and 7.5 but transfusion was withheld due to presence of positive Coombs test and outpatient hematology consult was recommended. FOBT was never sent.  She was found to have abnormal CBC from 09/07/2017 -with hemoglobin of 7 MCV of 95, WBC count of 3k with an ANC of 2.3k.  And a platelet count of 175k.  Reticulocyte percentage of 1.9, absolute reticulocyte count of 45.8k/ul .  She was noted to have a positive Coombs test but her LDH  and haptoglobin levels were within normal limits suggesting against overt hemolysis.  ANA negative. Sedimentation rate 138.  CRP within normal limits. Free T4 0.87 Ferritin 857 with 23% iron saturation. Folate 19.5 B12 - 470  She denies recent chest pain on exertion, shortness of breath on minimal exertion, or palpitations. She denies cough. She had not noticed any recent bleeding such as epistaxis, hematuria or hematochezia.   The patient denies over the counter NSAID ingestion. She is not on antiplatelets agents.   She had no prior history or diagnosis of cancer. Her age appropriate screening programs are up-to-date.  She denies any pica and eats a variety of diet.  She never donated blood or received blood transfusion.   Some days her appetite is better than others. The patients daughter states she has also become more forgetful at times.  Pt denies fever, chills, trouble swelling, abdominal pain, or any other symptoms or complaints at this time.  Patient really knows that she still feels somewhat fatigued but has no overt lightheadedness or dizziness at this time and feels better than when she was admitted. No overt infectious issues. No overt evidence of bleeding.  Some unquantified weight loss due to decreased appetite. No other overt focal symptoms.  MEDICAL HISTORY:  Past Medical History:  Diagnosis Date  . Hypertension   . Thyroid disease   Nonsustained VT Hypothyroidism Acute kidney injury 08/2017 Depression-on Celexa.  SURGICAL HISTORY: Past Surgical History:  Procedure Laterality Date  .  CORONARY ANGIOPLASTY      SOCIAL HISTORY: Social History   Socioeconomic History  . Marital status: Widowed    Spouse name: Not on file  . Number of children: Not on file  . Years of education: Not on file  . Highest education level: Not on file  Social Needs  . Financial resource strain: Not on file  . Food insecurity - worry: Not on file  . Food insecurity - inability:  Not on file  . Transportation needs - medical: Not on file  . Transportation needs - non-medical: Not on file  Occupational History  . Not on file  Tobacco Use  . Smoking status: Never Smoker  . Smokeless tobacco: Never Used  Substance and Sexual Activity  . Alcohol use: No  . Drug use: No  . Sexual activity: No  Other Topics Concern  . Not on file  Social History Narrative  . Not on file    FAMILY HISTORY: No family history on file.  ALLERGIES:  has No Known Allergies.  MEDICATIONS:  Current Outpatient Medications  Medication Sig Dispense Refill  . aspirin EC 81 MG tablet Take 81 mg daily after supper by mouth.     . citalopram (CELEXA) 10 MG tablet Take 5 mg daily after supper by mouth.     . ENSURE (ENSURE) Take 237 mLs by mouth 2 (two) times daily between meals.    . hydrocortisone cream 1 % Apply to affected area 2 times daily 15 g 0  . levothyroxine (SYNTHROID, LEVOTHROID) 50 MCG tablet Take 50 mcg daily before breakfast by mouth.     . triamcinolone (KENALOG) 0.025 % cream Apply 1 application 2 (two) times daily as needed topically (rash).    . Vitamin D, Ergocalciferol, (DRISDOL) 50000 units CAPS capsule Take 50,000 Units every 7 (seven) days by mouth.    . losartan (COZAAR) 25 MG tablet Take 1 tablet (25 mg total) by mouth daily. 30 tablet 0   No current facility-administered medications for this visit.     REVIEW OF SYSTEMS:    A 10+ POINT REVIEW OF SYSTEMS WAS OBTAINED including neurology, dermatology, psychiatry, cardiac, respiratory, lymph, extremities, GI, GU, Musculoskeletal, constitutional, breasts, reproductive, HEENT.  All pertinent positives are noted in the HPI.  All others are negative.  PHYSICAL EXAMINATION: ECOG PERFORMANCE STATUS: 2 - Symptomatic, <50% confined to bed  Vitals:   10/28/17 1116  BP: (!) 171/80  Pulse: 69  Resp: 18  Temp: 97.6 F (36.4 C)  SpO2: 98%   Filed Weights   10/28/17 1116  Weight: 138 lb 8 oz (62.8 kg)   NAD,  elderly frail lady. Looks younger than her stated age of 72 yrs GENERAL:alert, no distress and comfortable SKIN:  no rashes or significant lesions EYES: normal, conjunctiva are pink and non-injected, sclera clear OROPHARYNX:no exudate, no erythema and lips, buccal mucosa, and tongue normal  NECK: supple LYMPH:  no palpable lymphadenopathy in the cervical, axillary or inguinal LUNGS: clear to auscultation and percussion with normal breathing effort HEART: regular rate & rhythm and no murmurs and no lower extremity edema ABDOMEN:abdomen soft, non-tender and normal bowel sounds Musculoskeletal:no cyanosis of digits and no clubbing  PSYCH: alert & oriented x 3 with fluent speech NEURO: no focal motor/sensory deficits  LABORATORY DATA:  I have reviewed the data as listed  . CBC Latest Ref Rng & Units 10/28/2017 10/28/2017 09/07/2017  WBC 4.0 - 10.5 K/uL - - 3.4(L)  Hemoglobin 12.0 - 15.0 g/dL - -  7.5(L)  Hematocrit 34.8 - 46.6 % 29.0(L) 27.3(L) 22.7(L)  Platelets 150 - 400 K/uL - - 181   . CMP Latest Ref Rng & Units 10/28/2017 09/07/2017 09/06/2017  Glucose 70 - 140 mg/dL 84 80 76  BUN 7 - 26 mg/dL 15 11 12   Creatinine 0.44 - 1.00 mg/dL - 1.05(H) 1.07(H)  Sodium 136 - 145 mmol/L 139 137 139  Potassium 3.3 - 4.7 mmol/L 4.1 4.5 3.9  Chloride 98 - 109 mmol/L 104 107 110  CO2 22 - 29 mmol/L 26 26 25   Calcium 8.4 - 10.4 mg/dL 9.5 8.7(L) 8.3(L)  Total Protein 6.4 - 8.3 g/dL 8.8(H) 6.5 -  Total Bilirubin 0.2 - 1.2 mg/dL 0.8 0.7 -  Alkaline Phos 40 - 150 U/L 66 43 -  AST 5 - 34 U/L 25 24 -  ALT 0 - 55 U/L 11 11(L) -    Component     Latest Ref Rng & Units 10/28/2017 10/28/2017 10/28/2017        12:40 PM 12:42 PM 12:45 PM  WBC Count     3.9 - 10.3 K/uL   2.7 (L)  RBC     3.70 - 5.45 MIL/uL   2.95 (L)  Hemoglobin     11.6 - 15.9 g/dL   9.5 (L)  HCT     34.8 - 46.6 %  27.3 (L) 29.0 (L)  MCV     79.5 - 101.0 fL   98.3  MCH     25.1 - 34.0 pg   32.2  MCHC     31.5 - 36.0 g/dL   32.8   RDW     11.2 - 16.1 %   16.1  Platelet Count     145 - 400 K/uL   214  Neutrophils     %   56  NEUT#     1.5 - 6.5 K/uL   1.5  Lymphocytes     %   34  Lymphocyte #     0.9 - 3.3 K/uL   0.9  Monocytes Relative     %   8  Monocyte #     0.1 - 0.9 K/uL   0.2  Eosinophil     %   1  Eosinophils Absolute     0.0 - 0.5 K/uL   0.0  Basophil     %   1  Basophils Absolute     0.0 - 0.1 K/uL   0.0  Sodium     136 - 145 mmol/L 139    Potassium     3.3 - 4.7 mmol/L 4.1    Chloride     98 - 109 mmol/L 104    CO2     22 - 29 mmol/L 26    Glucose     70 - 140 mg/dL 84    BUN     7 - 26 mg/dL 15    Creatinine     0.60 - 1.10 mg/dL 1.15 (H)    Calcium     8.4 - 10.4 mg/dL 9.5    Total Protein     6.4 - 8.3 g/dL 8.8 (H)    Albumin     3.5 - 5.0 g/dL 3.9    AST     5 - 34 U/L 25    ALT     0 - 55 U/L 11    Alkaline Phosphatase     40 - 150 U/L 66    Total  Bilirubin     0.2 - 1.2 mg/dL 0.8    GFR, Est Non Af Am     >60 mL/min 38 (L)    GFR, Est AFR Am     >60 mL/min 44 (L)    Anion gap     3 - 11 9    TSH     0.450 - 4.500 uIU/mL 8.990 (H)    Thyroxine (T4)     4.5 - 12.0 ug/dL 7.1    T3 Uptake Ratio     24 - 39 % 24    Free Thyroxine Index     1.2 - 4.9 1.7    Kappa free light chain     3.3 - 19.4 mg/L 65.8 (H)    Lamda free light chains     5.7 - 26.3 mg/L 40.1 (H)    Kappa, lamda light chain ratio     0.26 - 1.65 1.64    Folate, Hemolysate     Not Estab. ng/mL  473.7   Folate, RBC     >498 ng/mL  1,735   Retic Ct Pct     0.7 - 2.1 %   2.3 (H)  RBC.     3.70 - 5.45 MIL/uL   2.95 (L)  Retic Count, Absolute     33.7 - 90.7 K/uL   67.9  Lactic Acid, Venous     0.5 - 1.9 mmol/L     Comment          Copper     72 - 166 ug/dL 117    Erythropoietin     2.6 - 18.5 mIU/mL 33.8 (H)    Haptoglobin     34 - 200 mg/dL 39    LDH     125 - 245 U/L 297 (H)    Homocysteine     0.0 - 15.0 umol/L  16.9 (H)   Vitamin B12     180 - 914 pg/mL   522    Pending MMA levels abd Myeloma panel.    RADIOGRAPHIC STUDIES: I have personally reviewed the radiological images as listed and agreed with the findings in the report. No results found.  ASSESSMENT & PLAN:   84 yo   1) Normocytic/Bordering on Macrocytic Anemia  Patient's hemoglobin is now improved from 7.5 to 9.5.  Slightly elevated LDH level at 267 but normal haptoglobin suggesting against acute hemolysis.    TSH is Elevated at 8.99 (normal FT4) in the setting of recent decrease in levothyroxine dose from 75mg to 554m per PCP.   Homocystine is elevated though B12 level is within normal limits and RBC folate- WNL MMA -pending.  No overt evidence of GI bleeding.  No evidence of iron deficiency.  Associated leukopenia with a WBC count of 2.7k with an ANC of 1500. Cannot rule out MDS given the patient's advanced age.  2) leucopenia without neutropenia  3) Elevated TSH with normal FT4  4) Elevated LDH levels -no overt evidence of hemolysis given normal haptoglobin level and no reticulocytosis and normal bilirubin levels.  Can sometimes be elevated with B vitamin deficiencies due to intramedullary hemolysis.  Pending MMA levels.  5) Positive direct Coombs test - no evidence of overt hemolysis at this time. PLAN -I discussed in detail with the patient and her daughter potential etiology of the anemia. -An extensive lab workup was ordered and reviewed as noted above. -Patient was recommended to empirically start B complex 1 capsule p.o. Daily. -Given that the patient  was on 75 mcg of levothyroxine for a long time and there has been cognitive decline and anemia and elevated TSH of 9 after dropping the dose to 50 mcg daily -PCP to consider increasing the dose back to 75 mcg daily. -No indication for PRBC transfusion at this time with hemoglobin of 9.5. -Daughter is very involved and will try to optimize the patient's oral intake. -Patient had a significant elevated sedimentation  rate.  Will await myeloma panel that is currently pending. -Pending MMA profile.  If elevated the patient will need to be started on B12 replacement in addition to the B complex. -If the patient has persistently elevated LDH levels without any other alternative explanation might need to consider CT chest abdomen pelvis to rule out a lymphoproliferative disorder -this will be based on patient's goals of care. -Associated leukopenia along with macrocytic anemia with some dyspoietic changes on PBS also brings up the possibility of MDS in this 84 year old patient.  Patient and her daughter understandably want to hold off on bone marrow examination unless needed. -If worsening anemia or leukopenia might need to consider bone marrow examination based on goals of care.  5) . Patient Active Problem List   Diagnosis Date Noted  . Syncope 09/28/2013  . Altered mental status 09/26/2013  . Syncope and collapse 09/26/2013  . Hypothyroidism 09/26/2013  . Depression 09/26/2013  . HTN (hypertension) 09/26/2013  Plan -continue f/u with PCP   . Orders Placed This Encounter  Procedures  . CBC & Diff and Retic    Standing Status:   Future    Number of Occurrences:   1    Standing Expiration Date:   10/28/2018  . CMP ([facility] only)    Standing Status:   Future    Number of Occurrences:   1    Standing Expiration Date:   10/28/2018  . Smear    Standing Status:   Future    Number of Occurrences:   1    Standing Expiration Date:   10/28/2018  . Vitamin B12    Standing Status:   Future    Number of Occurrences:   1    Standing Expiration Date:   10/28/2018  . Methylmalonic acid, serum    Standing Status:   Future    Number of Occurrences:   1    Standing Expiration Date:   10/28/2018  . Homocysteine, serum    Standing Status:   Future    Number of Occurrences:   1    Standing Expiration Date:   10/28/2018  . Folate RBC    Standing Status:   Future    Number of Occurrences:   1    Standing  Expiration Date:   10/28/2018  . Multiple Myeloma Panel (SPEP&IFE w/QIG)    Standing Status:   Future    Number of Occurrences:   1    Standing Expiration Date:   10/28/2018  . Kappa/lambda light chains    Standing Status:   Future    Number of Occurrences:   1    Standing Expiration Date:   12/02/2018  . Thyroid Panel With TSH    Standing Status:   Future    Number of Occurrences:   1    Standing Expiration Date:   10/28/2018  . Lactate dehydrogenase    Standing Status:   Future    Number of Occurrences:   1    Standing Expiration Date:   10/28/2018  . Haptoglobin  Standing Status:   Future    Number of Occurrences:   1    Standing Expiration Date:   10/28/2018  . Erythropoietin    Standing Status:   Future    Number of Occurrences:   1    Standing Expiration Date:   10/28/2018  . Copper, serum    Standing Status:   Future    Number of Occurrences:   1    Standing Expiration Date:   10/28/2018  . Type and screen    Standing Status:   Future    Number of Occurrences:   1    Standing Expiration Date:   10/28/2018   Labs today RTC with Dr Irene Limbo in 2 weeks  All questions were answered. The patient knows to call the clinic with any problems, questions or concerns. I spent 45 minutes counseling the patient face to face. The total time spent in the appointment was 60 minutes and more than 50% was on counseling.    This document serves as a record of services personally performed by Sullivan Lone, MD. It was created on his behalf by Margit Banda, a trained medical scribe. The creation of this record is based on the scribe's personal observations and the provider's statements to them.   .I have reviewed the above documentation for accuracy and completeness, and I agree with the above. Brunetta Genera MD MS

## 2017-10-28 NOTE — Patient Instructions (Signed)
Thank you for choosing [facility] to provide your oncology and hematology care.  To afford each patient quality time with our providers, please arrive 30 minutes before your scheduled appointment time.  If you arrive late for your appointment, you may be asked to reschedule.  We strive to give you quality time with our providers, and arriving late affects you and other patients whose appointments are after yours.   If you are a no show for multiple scheduled visits, you may be dismissed from the clinic at the providers discretion.    Again, thank you for choosing [facility], our hope is that these requests will decrease the amount of time that you wait before being seen by our physicians.  ______________________________________________________________________  Should you have questions after your visit to the [facility], please contact our office at [phone] between the hours of 8:30 and 4:30 p.m.    Voicemails left after 4:30p.m will not be returned until the following business day.    For prescription refill requests, please have your pharmacy contact us directly.  Please also try to allow 48 hours for prescription requests.    Please contact the scheduling department for questions regarding scheduling.  For scheduling of procedures such as PET scans, CT scans, MRI, Ultrasound, etc please contact central scheduling at [phone].    Resources For Cancer Patients and Caregivers:   Oncolink.org:  A wonderful resource for patients and healthcare providers for information regarding your disease, ways to tract your treatment, what to expect, etc.     American Cancer Society:  800-227-2345  Can help patients locate various types of support and financial assistance  Cancer Care: 1-800-813-HOPE (4673) Provides financial assistance, online support groups, medication/co-pay assistance.    [place] DSS:  336-641-3447 Where to apply for food  stamps, Medicaid, and utility assistance  Medicare Rights Center: 800-333-4114 Helps people with Medicare understand their rights and benefits, navigate the Medicare system, and secure the quality healthcare they deserve  SCAT: 336-333-6589 [place] [employer]'s shared-ride transportation service for eligible riders who have a disability that prevents them from riding the fixed route bus.    For additional information on assistance programs please contact our social worker:   Grier Hock/Abigail Elmore:  [phone]            

## 2017-10-29 LAB — KAPPA/LAMBDA LIGHT CHAINS
Kappa free light chain: 65.8 mg/L — ABNORMAL HIGH (ref 3.3–19.4)
Kappa, lambda light chain ratio: 1.64 (ref 0.26–1.65)
Lambda free light chains: 40.1 mg/L — ABNORMAL HIGH (ref 5.7–26.3)

## 2017-10-29 LAB — TYPE AND SCREEN
ABO/RH(D): O POS
Antibody Screen: POSITIVE
DAT, IGG: POSITIVE

## 2017-10-29 LAB — FOLATE RBC
FOLATE, HEMOLYSATE: 473.7 ng/mL
FOLATE, RBC: 1735 ng/mL (ref >498)
HEMATOCRIT: 27.3 % — AB (ref 34.0–46.6)

## 2017-10-29 LAB — ERYTHROPOIETIN: Erythropoietin: 33.8 m[IU]/mL — ABNORMAL HIGH (ref 2.6–18.5)

## 2017-10-29 LAB — HAPTOGLOBIN: HAPTOGLOBIN: 39 mg/dL (ref 34–200)

## 2017-10-29 LAB — THYROID PANEL WITH TSH
FREE THYROXINE INDEX: 1.7 (ref 1.2–4.9)
T3 Uptake Ratio: 24 % (ref 24–39)
T4, Total: 7.1 ug/dL (ref 4.5–12.0)
TSH: 8.99 u[IU]/mL — AB (ref 0.450–4.500)

## 2017-10-29 LAB — HOMOCYSTEINE: HOMOCYSTEINE-NORM: 16.9 umol/L — AB (ref 0.0–15.0)

## 2017-10-30 LAB — COPPER, SERUM: COPPER: 117 ug/dL (ref 72–166)

## 2017-11-01 ENCOUNTER — Ambulatory Visit: Payer: Medicare Other | Admitting: Neurology

## 2017-11-01 LAB — MULTIPLE MYELOMA PANEL, SERUM
ALBUMIN SERPL ELPH-MCNC: 3.8 g/dL (ref 2.9–4.4)
Albumin/Glob SerPl: 0.8 (ref 0.7–1.7)
Alpha 1: 0.2 g/dL (ref 0.0–0.4)
Alpha2 Glob SerPl Elph-Mcnc: 0.5 g/dL (ref 0.4–1.0)
B-Globulin SerPl Elph-Mcnc: 1.1 g/dL (ref 0.7–1.3)
Gamma Glob SerPl Elph-Mcnc: 2.9 g/dL — ABNORMAL HIGH (ref 0.4–1.8)
Globulin, Total: 4.8 g/dL — ABNORMAL HIGH (ref 2.2–3.9)
IGA: 355 mg/dL (ref 64–422)
IgG (Immunoglobin G), Serum: 2190 mg/dL — ABNORMAL HIGH (ref 700–1600)
IgM (Immunoglobulin M), Srm: 1002 mg/dL — ABNORMAL HIGH (ref 26–217)
TOTAL PROTEIN ELP: 8.6 g/dL — AB (ref 6.0–8.5)

## 2017-11-03 LAB — METHYLMALONIC ACID, SERUM: METHYLMALONIC ACID, QUANTITATIVE: 209 nmol/L (ref 0–378)

## 2017-11-10 ENCOUNTER — Telehealth: Payer: Self-pay | Admitting: Hematology

## 2017-11-10 NOTE — Telephone Encounter (Signed)
CME - moved 1/25 f/u to 2/1. Spoke with patient dtr.

## 2017-11-11 ENCOUNTER — Encounter: Payer: Self-pay | Admitting: Podiatry

## 2017-11-11 ENCOUNTER — Ambulatory Visit: Payer: Medicare Other | Admitting: Podiatry

## 2017-11-11 DIAGNOSIS — M79676 Pain in unspecified toe(s): Secondary | ICD-10-CM

## 2017-11-11 DIAGNOSIS — B351 Tinea unguium: Secondary | ICD-10-CM

## 2017-11-11 NOTE — Progress Notes (Signed)
Complaint:  Visit Type: Patient returns to my office for continued preventative foot care services. Complaint: Patient states" my nails have grown long and thick and become painful to walk and wear shoes"  The patient presents for preventative foot care services. No changes to ROS  Podiatric Exam: Vascular: dorsalis pedis and posterior tibial pulses are palpable bilateral. Capillary return is immediate. Temperature gradient is WNL. Skin turgor WNL  Sensorium: Normal Semmes Weinstein monofilament test. Normal tactile sensation bilaterally. Nail Exam: Pt has thick disfigured discolored nails with subungual debris noted bilateral entire nail hallux through fifth toenails Ulcer Exam: There is no evidence of ulcer or pre-ulcerative changes or infection. Orthopedic Exam: Muscle tone and strength are WNL. No limitations in general ROM. No crepitus or effusions noted. Foot type and digits show no abnormalities.  HAV  B/L with hammer toes second  B/L Skin: No Porokeratosis. No infection or ulcers  Diagnosis:  Onychomycosis, , Pain in right toe, pain in left toes  Treatment & Plan Procedures and Treatment: Consent by patient was obtained for treatment procedures. The patient understood the discussion of treatment and procedures well. All questions were answered thoroughly reviewed. Debridement of mycotic and hypertrophic toenails, 1 through 5 bilateral and clearing of subungual debris. No ulceration, no infection noted.  Return Visit-Office Procedure: Patient instructed to return to the office for a follow up visit 3 months for continued evaluation and treatment.    Gardiner Barefoot DPM

## 2017-11-12 ENCOUNTER — Ambulatory Visit: Payer: Medicare Other | Admitting: Hematology
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
## 2017-11-19 ENCOUNTER — Encounter: Payer: Self-pay | Admitting: Hematology

## 2017-11-19 ENCOUNTER — Telehealth: Payer: Self-pay | Admitting: Hematology

## 2017-11-19 ENCOUNTER — Inpatient Hospital Stay: Payer: Medicare Other | Attending: Hematology | Admitting: Hematology

## 2017-11-19 VITALS — BP 168/97 | HR 68 | Temp 98.2°F | Resp 17 | Ht 62.0 in | Wt 139.0 lb

## 2017-11-19 DIAGNOSIS — Z79899 Other long term (current) drug therapy: Secondary | ICD-10-CM | POA: Insufficient documentation

## 2017-11-19 DIAGNOSIS — R946 Abnormal results of thyroid function studies: Secondary | ICD-10-CM | POA: Insufficient documentation

## 2017-11-19 DIAGNOSIS — I10 Essential (primary) hypertension: Secondary | ICD-10-CM | POA: Insufficient documentation

## 2017-11-19 DIAGNOSIS — F329 Major depressive disorder, single episode, unspecified: Secondary | ICD-10-CM | POA: Insufficient documentation

## 2017-11-19 DIAGNOSIS — D649 Anemia, unspecified: Secondary | ICD-10-CM

## 2017-11-19 DIAGNOSIS — D72819 Decreased white blood cell count, unspecified: Secondary | ICD-10-CM | POA: Diagnosis not present

## 2017-11-19 DIAGNOSIS — Z7982 Long term (current) use of aspirin: Secondary | ICD-10-CM | POA: Diagnosis not present

## 2017-11-19 DIAGNOSIS — E039 Hypothyroidism, unspecified: Secondary | ICD-10-CM | POA: Insufficient documentation

## 2017-11-19 DIAGNOSIS — R700 Elevated erythrocyte sedimentation rate: Secondary | ICD-10-CM | POA: Insufficient documentation

## 2017-11-19 DIAGNOSIS — R740 Nonspecific elevation of levels of transaminase and lactic acid dehydrogenase [LDH]: Secondary | ICD-10-CM | POA: Insufficient documentation

## 2017-11-19 NOTE — Telephone Encounter (Signed)
Appointment scheduled and patient was given AVS/Calendar

## 2017-12-17 ENCOUNTER — Emergency Department (HOSPITAL_COMMUNITY)
Admission: EM | Admit: 2017-12-17 | Discharge: 2017-12-17 | Disposition: A | Discharge status: Home / Self Care | Payer: Medicare Other | Attending: Emergency Medicine | Admitting: Emergency Medicine

## 2017-12-17 ENCOUNTER — Encounter (HOSPITAL_COMMUNITY): Payer: Self-pay

## 2017-12-17 ENCOUNTER — Other Ambulatory Visit: Payer: Self-pay

## 2017-12-17 DIAGNOSIS — N939 Abnormal uterine and vaginal bleeding, unspecified: Secondary | ICD-10-CM | POA: Insufficient documentation

## 2017-12-17 DIAGNOSIS — Z7982 Long term (current) use of aspirin: Secondary | ICD-10-CM | POA: Diagnosis not present

## 2017-12-17 DIAGNOSIS — Z79899 Other long term (current) drug therapy: Secondary | ICD-10-CM | POA: Insufficient documentation

## 2017-12-17 DIAGNOSIS — I10 Essential (primary) hypertension: Secondary | ICD-10-CM | POA: Insufficient documentation

## 2017-12-17 DIAGNOSIS — E039 Hypothyroidism, unspecified: Secondary | ICD-10-CM | POA: Diagnosis not present

## 2017-12-17 LAB — URINALYSIS, ROUTINE W REFLEX MICROSCOPIC
Bilirubin Urine: NEGATIVE
GLUCOSE, UA: NEGATIVE mg/dL
KETONES UR: NEGATIVE mg/dL
NITRITE: NEGATIVE
PROTEIN: 100 mg/dL — AB
Specific Gravity, Urine: 1.016 (ref 1.005–1.030)
pH: 6 (ref 5.0–8.0)

## 2017-12-17 LAB — CBC
HCT: 29.5 % — ABNORMAL LOW (ref 36.0–46.0)
HEMOGLOBIN: 9.5 g/dL — AB (ref 12.0–15.0)
MCH: 31.5 pg (ref 26.0–34.0)
MCHC: 32.2 g/dL (ref 30.0–36.0)
MCV: 97.7 fL (ref 78.0–100.0)
Platelets: 217 10*3/uL (ref 150–400)
RBC: 3.02 MIL/uL — AB (ref 3.87–5.11)
RDW: 16.1 % — ABNORMAL HIGH (ref 11.5–15.5)
WBC: 2.7 10*3/uL — ABNORMAL LOW (ref 4.0–10.5)

## 2017-12-17 LAB — COMPREHENSIVE METABOLIC PANEL
ALBUMIN: 3.6 g/dL (ref 3.5–5.0)
ALK PHOS: 54 U/L (ref 38–126)
ALT: 11 U/L — ABNORMAL LOW (ref 14–54)
AST: 25 U/L (ref 15–41)
Anion gap: 9 (ref 5–15)
BUN: 18 mg/dL (ref 6–20)
CALCIUM: 8.9 mg/dL (ref 8.9–10.3)
CO2: 21 mmol/L — AB (ref 22–32)
Chloride: 108 mmol/L (ref 101–111)
Creatinine, Ser: 1.1 mg/dL — ABNORMAL HIGH (ref 0.44–1.00)
GFR calc non Af Amer: 40 mL/min — ABNORMAL LOW (ref >60)
GFR, EST AFRICAN AMERICAN: 47 mL/min — AB (ref >60)
GLUCOSE: 89 mg/dL (ref 65–99)
POTASSIUM: 4.2 mmol/L (ref 3.5–5.1)
SODIUM: 138 mmol/L (ref 135–145)
Total Bilirubin: 0.8 mg/dL (ref 0.3–1.2)
Total Protein: 7.8 g/dL (ref 6.5–8.1)

## 2017-12-17 NOTE — ED Triage Notes (Signed)
Pt presents with complaints of moderate amounts of vaginal bleeding x 1 day. Denies any pain, nausea or vomiting. Denies any other complaints. Reports history of anemia. NAD in triage.

## 2017-12-17 NOTE — ED Provider Notes (Signed)
[facility] EMERGENCY DEPARTMENT Provider Note   CSN: [id number] Arrival date & time: 12/17/17  1055     History   Chief Complaint Chief Complaint  Patient presents with  . Vaginal Bleeding    HPI LINDIE ROBERSON is a 84 y.o. female.  HPI  84 year old female history of hypertension, thyroid disease presents the emergency department with a single episode of incidental finding of a presumed vaginal bleeding with moderate amount while changing clothing this morning.  Family member at bedside states negative history of cancer, n negative anticoagulation/antiplatelet therapy.  Patient has no recent illness/trauma, neg voiding sx or vaginal d/c.   Past Medical History:  Diagnosis Date  . Hypertension   . Thyroid disease     Patient Active Problem List   Diagnosis Date Noted  . Syncope 09/28/2013  . Altered mental status 09/26/2013  . Syncope and collapse 09/26/2013  . Hypothyroidism 09/26/2013  . Depression 09/26/2013  . HTN (hypertension) 09/26/2013    Past Surgical History:  Procedure Laterality Date  . CORONARY ANGIOPLASTY      OB History    No data available       Home Medications    Prior to Admission medications   Medication Sig Start Date End Date Taking? Authorizing Provider  aspirin EC 81 MG tablet Take 81 mg daily after supper by mouth.     [provider]  citalopram (CELEXA) 10 MG tablet Take 5 mg daily after supper by mouth.  08/24/13   [provider]  ENSURE (ENSURE) Take 237 mLs by mouth 2 (two) times daily between meals.    [provider]  hydrocortisone cream 1 % Apply to affected area 2 times daily 12/30/12   Palumbo, April, MD  levothyroxine (SYNTHROID, LEVOTHROID) 50 MCG tablet Take 50 mcg daily before breakfast by mouth.     [provider]  losartan (COZAAR) 50 MG tablet Take 50 mg by mouth daily.    [provider]  triamcinolone (KENALOG) 0.025 % cream Apply 1 application 2  (two) times daily as needed topically (rash).    [provider]  Vitamin D, Ergocalciferol, (DRISDOL) 50000 units CAPS capsule Take 50,000 Units every 7 (seven) days by mouth.    [provider]    Family History No family history on file.  Social History Social History   Tobacco Use  . Smoking status: Never Smoker  . Smokeless tobacco: Never Used  Substance Use Topics  . Alcohol use: No  . Drug use: No     Allergies   Patient has no known allergies.   Review of Systems Review of Systems  Review of Systems  Constitutional: Negative for fever and chills.  HENT: Negative for ear pain, sore throat and trouble swallowing.   Eyes: Negative for pain and visual disturbance.  Respiratory: Negative for cough and shortness of breath.   Cardiovascular: Negative for chest pain and leg swelling.  Gastrointestinal: Negative for nausea, vomiting, abdominal pain and diarrhea.  Genitourinary: see HPI Musculoskeletal: Negative for back pain and joint swelling.  Skin: Negative for rash and wound.  Neurological: Negative for dizziness, syncope, speech difficulty, weakness and numbness.   Physical Exam Updated Vital Signs BP (!) 158/69 (BP Location: Right Arm)   Pulse 70   Temp 98.6 F (37 C)   Resp 14   Wt 63.5 kg (140 lb)   SpO2 100%   BMI 25.61 kg/m   Physical Exam  Physical Exam Vitals:   12/17/17  1119 12/17/17 1346  BP: (!) 163/74 (!) 158/69  Pulse: 64 70  Resp: 18 14  Temp: 98.6 F (37 C)   SpO2: 100% 100%   Constitutional: Patient is in no acute distress Head: Normocephalic and atraumatic.  Eyes: Extraocular motion intact, no scleral icterus Neck: Supple without meningismus, mass, or overt JVD Respiratory: Effort normal and breath sounds normal. No respiratory distress. CV: Heart regular rate and rhythm, no obvious murmurs.  Pulses +2 and symmetric Abdomen: Soft, non-tender, non-distended GU: Negative lesions externally; small amount of blood  within the introitus with what appears to be suspected possible prolapsed uterus with some friability at the distal end but no gross active bleeding. MSK: Extremities are atraumatic without deformity, ROM intact Skin: Warm, dry, intact Neuro: Alert and oriented, no motor deficit noted Psychiatric: Mood and affect are normal.  ED Treatments / Results  Labs (all labs ordered are listed, but only abnormal results are displayed) Labs Reviewed  COMPREHENSIVE METABOLIC PANEL - Abnormal; Notable for the following components:      Result Value   CO2 21 (*)    Creatinine, Ser 1.10 (*)    ALT 11 (*)    GFR calc non Af Amer 40 (*)    GFR calc Af Amer 47 (*)    All other components within normal limits  CBC - Abnormal; Notable for the following components:   WBC 2.7 (*)    RBC 3.02 (*)    Hemoglobin 9.5 (*)    HCT 29.5 (*)    RDW 16.1 (*)    All other components within normal limits  URINALYSIS, ROUTINE W REFLEX MICROSCOPIC - Abnormal; Notable for the following components:   APPearance HAZY (*)    Hgb urine dipstick LARGE (*)    Protein, ur 100 (*)    Leukocytes, UA SMALL (*)    Bacteria, UA RARE (*)    Squamous Epithelial / LPF 0-5 (*)    All other components within normal limits    EKG  EKG Interpretation None       Radiology No results found.  Procedures Procedures (including critical care time)  Medications Ordered in ED Medications - No data to display   Initial Impression / Assessment and Plan / ED Course  I have reviewed the triage vital signs and the nursing notes.  Pertinent labs & imaging results that were available during my care of the patient were reviewed by me and considered in my medical decision making (see chart for details).     84 year old female history of hypertension, thyroid disease presents the emergency department with a single episode of incidental finding of a presumed vaginal bleeding with moderate amount while changing clothing this  morning.  Family member at bedside states negative history of cancer, negative prior surgeries to repair the rectum orients, negative anticoagulation/antiplatelet therapy.  Patient has no recent illness/trauma, neg voiding sx or vaginal d/c.  Patient has been medically stable well-appearing.  Review of patient's lab shows no evidence of leukocytosis, hemoglobin of 9.5 stable when compared to previous in the past month, negative electrolyte imbalance with renal insufficiency a creatinine of 1.10. physical exam history/clinical presentation appears to be consistent with possible uterine prolapse with some friability at the distal end of the uterus versus mass.  Patient currently is asymptomatic, hemodynamically stable, negative leukocytosis concerning for infection and negative voiding symptoms.  Have had shared decision making with family members and daughter at bedside recommendation for follow-up to routine appointment with OB/GYN for evaluation  and possible additional imaging nonemergent as needed.  Family members understand the plan will be discharged home with good return precautions.  Of note urine profile not concerning for infection.   Final Clinical Impressions(s) / ED Diagnoses   Final diagnoses:  Vaginal bleeding    ED Discharge Orders    None       Willette Alma, DO 12/17/17 [place], [place], DO 12/17/17 1751

## 2017-12-17 NOTE — Care Management (Signed)
ED CM met with patient at bedside to discuss transitional care planning. Patient reports she is lives alone,  but has been staying with daughter intermittently in [place], Discussed [facility] and how the patient may benefit from the services patient is agreeable. [facility] Referral placed and provided patient with [facility] informational brochure. Patient is very [employer]. Patient reminded to follow up with PCP post ED visit patient verbalized understand and teach back done.

## 2017-12-17 NOTE — Discharge Instructions (Signed)
Follow-up with your primary care provider or primary gynecologist for reevaluation for current chief complaint of vaginal bleeding.  Concern for possible uterine prolapse.   patient may benefit from nonemergent pelvic ultrasound.

## 2017-12-28 ENCOUNTER — Other Ambulatory Visit: Payer: Self-pay | Admitting: Obstetrics & Gynecology

## 2018-01-04 ENCOUNTER — Telehealth: Payer: Self-pay | Admitting: *Deleted

## 2018-01-04 NOTE — Telephone Encounter (Signed)
Called and left the daughter a message to call the office back. Patient's daughter needs to be given the appt for MArch 22.

## 2018-01-05 ENCOUNTER — Telehealth: Payer: Self-pay | Admitting: *Deleted

## 2018-01-05 NOTE — Telephone Encounter (Signed)
Called and spoke with the daughter. The daughter stated " now is not a good time to talk, can you call us back tomorrow at this time." Advised the daughter that I can call back tomorrow at 4:30pm, and asked if the patient can come Friday for an appt. Daughter stated" that 4:30pm would be good, we need to go out of town and can't come Friday. Maybe next week." Advised the daughter I would call back tomorrow

## 2018-01-06 ENCOUNTER — Telehealth: Payer: Self-pay | Admitting: *Deleted

## 2018-01-06 NOTE — Telephone Encounter (Signed)
atempted to call the dughter, no answer. [facility]

## 2018-01-07 ENCOUNTER — Telehealth: Payer: Self-pay | Admitting: *Deleted

## 2018-01-07 ENCOUNTER — Ambulatory Visit: Payer: Medicare Other | Admitting: Gynecologic Oncology

## 2018-01-07 NOTE — Telephone Encounter (Signed)
Returned the patient daughter's call and rescheduled the new patient appt.

## 2018-01-11 ENCOUNTER — Ambulatory Visit: Payer: Medicare Other | Admitting: Neurology

## 2018-01-13 NOTE — Progress Notes (Signed)
HEMATOLOGY ONCOLOGY CLINIC NOTE   Patient Care Team: Lajean Manes, MD as PCP - General (Internal Medicine)  CHIEF COMPLAINTS/PURPOSE OF CONSULTATION:  Anemia  HISTORY OF PRESENTING ILLNESS:   Wendy [name] 84 y.o. female is here because of worsening anemia.  Patient has a history of hypertension and hypothyroidism who as per her daughter has been a very active person until recently. Her daughter notes that patient's levothyroxine dose was reduced from 75 mcg to 50 mcg daily and this produced a significant change in the patient's cognition and energy levels.  She reports that it is very unusual for the patient to wake up late and then sleep throughout the day as she is currently doing. Per the patient's daughter she was a very active person, who would do a lot of walking, including walking around the track.   She notes that the patient has been slowly declining over the past year ever since seeing a new provider who started decreasing her thyroid medication. The patient's appetite has been declining as well.    She was seen in the ED for two syncopal episodes. On 09/04/2018 .  She was noted to be hypotensive and hypoxic.  Symptoms were thought to be related to dehydration due to poor oral intake.  Echo showed normal ejection fraction of 60-65% with grade 2 diastolic dysfunction.  Her losartan dose was significantly reduced on discharge.  She was noted to have several episodes of nonsustained VT but her heart rate was in the 50s.  Hemoglobin was noted to be between 7 and 7.5 but transfusion was withheld due to presence of positive Coombs test and outpatient hematology consult was recommended. FOBT was never sent.  She was found to have abnormal CBC from 09/07/2017 -with hemoglobin of 7 MCV of 95, WBC count of 3k with an ANC of 2.3k.  And a platelet count of 175k.  Reticulocyte percentage of 1.9, absolute reticulocyte count of 45.8k/ul .  She was noted to have a positive Coombs test but  her LDH and haptoglobin levels were within normal limits suggesting against overt hemolysis.  ANA negative. Sedimentation rate 138.  CRP within normal limits. Free T4 0.87 Ferritin 857 with 23% iron saturation. Folate 19.5 B12 - 470  She denies recent chest pain on exertion, shortness of breath on minimal exertion, or palpitations. She denies cough. She had not noticed any recent bleeding such as epistaxis, hematuria or hematochezia.   The patient denies over the counter NSAID ingestion. She is not on antiplatelets agents.   She had no prior history or diagnosis of cancer. Her age appropriate screening programs are up-to-date.  She denies any pica and eats a variety of diet.  She never donated blood or received blood transfusion.   Some days her appetite is better than others. The patients daughter states she has also become more forgetful at times.  Pt denies fever, chills, trouble swelling, abdominal pain, or any other symptoms or complaints at this time.  Patient really knows that she still feels somewhat fatigued but has no overt lightheadedness or dizziness at this time and feels better than when she was admitted. No overt infectious issues. No overt evidence of bleeding.  Some unquantified weight loss due to decreased appetite. No other overt focal symptoms.  Interval History:   Wendy [name] is here regarding her borderline macrocytic anemia  and leucopenia Since her last visit she presented to the ED on 12/17/17 for vaginal bleeding. She had an examination by Dr Nelda Marseille and  a cervical biopsy that shows high grade papillary carcinoma. She notes that she continues to have vaginal bleeding and some lower abdominal fullness.  On review of symptoms, pt notes no change in urination or bowel movements.  MMP showed IgG at 2190, IgM at 1002, Total protein at 8.6, Gamma Glob at 2.9, Globulin Total at 4.8 and all others WNL. Copper WNL at 117. EPO elevated at 33.8. Hepatoglobin WNL at  39. LDH elevated at 297. Thyroid panel with TSH showed TSH at 8.990 and all others WNL. Kappa/lambda showed kappa at 65.8, Lambda at 40.1 and ratio WNL at 1.64. CMP showed creatinine at 1.15 and total protein at 8.8 and all others WNL.   MEDICAL HISTORY:  Past Medical History:  Diagnosis Date  . Hypertension   . Thyroid disease   Nonsustained VT Hypothyroidism Acute kidney injury 08/2017 Depression-on Celexa.  SURGICAL HISTORY: Past Surgical History:  Procedure Laterality Date  . CORONARY ANGIOPLASTY      SOCIAL HISTORY: Social History   Socioeconomic History  . Marital status: Widowed    Spouse name: Not on file  . Number of children: Not on file  . Years of education: Not on file  . Highest education level: Not on file  Occupational History  . Not on file  Social Needs  . Financial resource strain: Not on file  . Food insecurity:    Worry: Not on file    Inability: Not on file  . Transportation needs:    Medical: Not on file    Non-medical: Not on file  Tobacco Use  . Smoking status: Never Smoker  . Smokeless tobacco: Never Used  Substance and Sexual Activity  . Alcohol use: No  . Drug use: No  . Sexual activity: Never  Lifestyle  . Physical activity:    Days per week: Not on file    Minutes per session: Not on file  . Stress: Not on file  Relationships  . Social connections:    Talks on phone: Not on file    Gets together: Not on file    Attends religious service: Not on file    Active member of club or organization: Not on file    Attends meetings of clubs or organizations: Not on file    Relationship status: Not on file  . Intimate partner violence:    Fear of current or ex partner: Not on file    Emotionally abused: Not on file    Physically abused: Not on file    Forced sexual activity: Not on file  Other Topics Concern  . Not on file  Social History Narrative  . Not on file    FAMILY HISTORY: No family history on file.  ALLERGIES:   has No Known Allergies.  MEDICATIONS:  Current Outpatient Medications  Medication Sig Dispense Refill  . aspirin EC 81 MG tablet Take 81 mg daily after supper by mouth.     . citalopram (CELEXA) 10 MG tablet Take 5 mg daily after supper by mouth.     . ENSURE (ENSURE) Take 237 mLs by mouth 2 (two) times daily between meals.    . hydrocortisone cream 1 % Apply to affected area 2 times daily 15 g 0  . levothyroxine (SYNTHROID, LEVOTHROID) 50 MCG tablet Take 50 mcg daily before breakfast by mouth.     . losartan (COZAAR) 50 MG tablet Take 50 mg by mouth daily.    Marland Kitchen triamcinolone (KENALOG) 0.025 % cream Apply 1 application 2 (two)  times daily as needed topically (rash).    . Vitamin D, Ergocalciferol, (DRISDOL) 50000 units CAPS capsule Take 50,000 Units every 7 (seven) days by mouth.     No current facility-administered medications for this visit.     REVIEW OF SYSTEMS:    .10 Point review of Systems was done is negative except as noted above.  PHYSICAL EXAMINATION: ECOG PERFORMANCE STATUS: 2 - Symptomatic, <50% confined to bed  Vitals:   01/18/18 1219  BP: (!) 146/68  Pulse: (!) 57  Resp: 18  Temp: 98.4 F (36.9 C)  SpO2: 100%   Filed Weights   01/18/18 1219  Weight: 133 lb 8 oz (60.6 kg)   GENERAL:elderly frail lady, alert, in no acute distress and comfortable SKIN: no acute rashes, no significant lesions EYES: conjunctiva are pink and non-injected, sclera anicteric OROPHARYNX: MMM, no exudates, no oropharyngeal erythema or ulceration NECK: supple, no JVD LYMPH:  no palpable lymphadenopathy in the cervical, axillary or inguinal regions LUNGS: clear to auscultation b/l with normal respiratory effort HEART: regular rate & rhythm ABDOMEN:  normoactive bowel sounds , non tender, not distended. Extremity: no pedal edema PSYCH: alert & oriented  with fluent speech NEURO: no focal motor/sensory deficits   LABORATORY DATA:  I have reviewed the data as listed  . CBC Latest  Ref Rng & Units 12/17/2017 10/28/2017 10/28/2017  WBC 4.0 - 10.5 K/uL 2.7(L) 2.7(L) -  Hemoglobin 12.0 - 15.0 g/dL 9.5(L) - -  Hematocrit 36.0 - 46.0 % 29.5(L) 29.0(L) 27.3(L)  Platelets 150 - 400 K/uL 217 214 -   . CMP Latest Ref Rng & Units 12/17/2017 10/28/2017 09/07/2017  Glucose 65 - 99 mg/dL 89 84 80  BUN 6 - 20 mg/dL 18 15 11   Creatinine 0.44 - 1.00 mg/dL 1.10(H) 1.15(H) 1.05(H)  Sodium 135 - 145 mmol/L 138 139 137  Potassium 3.5 - 5.1 mmol/L 4.2 4.1 4.5  Chloride 101 - 111 mmol/L 108 104 107  CO2 22 - 32 mmol/L 21(L) 26 26  Calcium 8.9 - 10.3 mg/dL 8.9 9.5 8.7(L)  Total Protein 6.5 - 8.1 g/dL 7.8 8.8(H) 6.5  Total Bilirubin 0.3 - 1.2 mg/dL 0.8 0.8 0.7  Alkaline Phos 38 - 126 U/L 54 66 43  AST 15 - 41 U/L 25 25 24   ALT 14 - 54 U/L 11(L) 11 11(L)    Component     Latest Ref Rng & Units 10/28/2017 10/28/2017 10/28/2017        12:40 PM 12:42 PM 12:45 PM  WBC Count     3.9 - 10.3 K/uL   2.7 (L)  RBC     3.70 - 5.45 MIL/uL   2.95 (L)  Hemoglobin     11.6 - 15.9 g/dL   9.5 (L)  HCT     34.8 - 46.6 %  27.3 (L) 29.0 (L)  MCV     79.5 - 101.0 fL   98.3  MCH     25.1 - 34.0 pg   32.2  MCHC     31.5 - 36.0 g/dL   32.8  RDW     11.2 - 16.1 %   16.1  Platelet Count     145 - 400 K/uL   214  Neutrophils     %   56  NEUT#     1.5 - 6.5 K/uL   1.5  Lymphocytes     %   34  Lymphocyte #     0.9 - 3.3 K/uL  0.9  Monocytes Relative     %   8  Monocyte #     0.1 - 0.9 K/uL   0.2  Eosinophil     %   1  Eosinophils Absolute     0.0 - 0.5 K/uL   0.0  Basophil     %   1  Basophils Absolute     0.0 - 0.1 K/uL   0.0  Sodium     136 - 145 mmol/L 139    Potassium     3.3 - 4.7 mmol/L 4.1    Chloride     98 - 109 mmol/L 104    CO2     22 - 29 mmol/L 26    Glucose     70 - 140 mg/dL 84    BUN     7 - 26 mg/dL 15    Creatinine     0.60 - 1.10 mg/dL 1.15 (H)    Calcium     8.4 - 10.4 mg/dL 9.5    Total Protein     6.4 - 8.3 g/dL 8.8 (H)    Albumin     3.5 - 5.0 g/dL  3.9    AST     5 - 34 U/L 25    ALT     0 - 55 U/L 11    Alkaline Phosphatase     40 - 150 U/L 66    Total Bilirubin     0.2 - 1.2 mg/dL 0.8    GFR, Est Non Af Am     >60 mL/min 38 (L)    GFR, Est AFR Am     >60 mL/min 44 (L)    Anion gap     3 - 11 9    TSH     0.450 - 4.500 uIU/mL 8.990 (H)    Thyroxine (T4)     4.5 - 12.0 ug/dL 7.1    T3 Uptake Ratio     24 - 39 % 24    Free Thyroxine Index     1.2 - 4.9 1.7    Kappa free light chain     3.3 - 19.4 mg/L 65.8 (H)    Lamda free light chains     5.7 - 26.3 mg/L 40.1 (H)    Kappa, lamda light chain ratio     0.26 - 1.65 1.64    Folate, Hemolysate     Not Estab. ng/mL  473.7   Folate, RBC     >498 ng/mL  1,735   Retic Ct Pct     0.7 - 2.1 %   2.3 (H)  RBC.     3.70 - 5.45 MIL/uL   2.95 (L)  Retic Count, Absolute     33.7 - 90.7 K/uL   67.9  Lactic Acid, Venous     0.5 - 1.9 mmol/L     Comment          Copper     72 - 166 ug/dL 117    Erythropoietin     2.6 - 18.5 mIU/mL 33.8 (H)    Haptoglobin     34 - 200 mg/dL 39    LDH     125 - 245 U/L 297 (H)    Homocysteine     0.0 - 15.0 umol/L  16.9 (H)   Vitamin B12     180 - 914 pg/mL   522       RADIOGRAPHIC STUDIES: I have  personally reviewed the radiological images as listed and agreed with the findings in the report. No results found.  ASSESSMENT & PLAN:   84 yo   1) Normocytic Anemia likely multifactorial  Likely MDS + blood loss from uterine high grade papillary carcinoma + Anemia of chronic disease related to newly diagnosed malignancy.  Patient's hemoglobin is now improved from 7.5 to 9.5 to 9.8 Slightly elevated LDH level at 297 but normal haptoglobin suggesting against acute hemolysis.   Homocystine is elevated though B12 level is within normal limits and RBC folate- WNL  10/28/17 MM panel showed no M-Protein, with elevated IgG and IgM. Kappa and lambda light chain were both elevated- likely reactive to newly diagnosed malignancy. No overt  evidence of GI bleeding.  No evidence of iron deficiency. Cannot rule out MDS given the patient's advanced age.  2) leukopenia with mild neutropenia 1.3k  3) Elevated TSH with normal FT4 -TSH is Elevated at 8.99 (normal FT4) in the setting of recent decrease in levothyroxine dose from 43mcg to 72mcg per PCP.  4) Elevated LDH levels -no overt evidence of hemolysis given normal haptoglobin level and no reticulocytosis and normal bilirubin levels.  Likely from uterine papillary carcinoma 5) Positive direct Coombs test - no evidence of overt hemolysis at this time.   PLAN  -patient is back on levothyroxine of 28mcg po daily -hgb stable at 9.8 -discussed biopsy results showing new high grade uterine papillary carcinoma. -will get CT chest /abd/pelvis to complete staging of uterine cancer -she has a f/u with Dr Denman George on 01/21/2018. - she would likely be a poor candidate for any possible chemotherapy given her limited bone marrow reserve. -No indication for PRBC transfusion at this time with hemoglobin of 9.8. -continue B complex. -Daughter is very involved and will try to optimize the patient's oral intake.  5) . Patient Active Problem List   Diagnosis Date Noted  . Syncope 09/28/2013  . Altered mental status 09/26/2013  . Syncope and collapse 09/26/2013  . Hypothyroidism 09/26/2013  . Depression 09/26/2013  . HTN (hypertension) 09/26/2013  Plan -continue f/u with PCP  CT chest/abd/pelvis in 3-5 days F/u with Dr Denman George as per scheduled appointment on 01/21/2018 RTC with Dr Irene Limbo with labs in 4 weeks, Sooner based on gyn onc input and CT results   . The total time spent in the appointment was 25 minutes and more than 50% was on counseling and direct patient cares.   This document serves as a record of services personally performed by Sullivan Lone, MD. It was created on his behalf by Baldwin Jamaica, a trained medical scribe. The creation of this record is based on the scribe's personal  observations and the provider's statements to them.   .I have reviewed the above documentation for accuracy and completeness, and I agree with the above. Brunetta Genera MD MS

## 2018-01-18 ENCOUNTER — Inpatient Hospital Stay: Payer: Medicare Other | Attending: Hematology | Admitting: Hematology

## 2018-01-18 ENCOUNTER — Telehealth: Payer: Self-pay | Admitting: Hematology

## 2018-01-18 ENCOUNTER — Encounter: Payer: Self-pay | Admitting: Hematology

## 2018-01-18 ENCOUNTER — Inpatient Hospital Stay: Payer: Medicare Other

## 2018-01-18 VITALS — BP 146/68 | HR 57 | Temp 98.4°F | Resp 18 | Ht 62.0 in | Wt 133.5 lb

## 2018-01-18 DIAGNOSIS — R740 Nonspecific elevation of levels of transaminase and lactic acid dehydrogenase [LDH]: Secondary | ICD-10-CM | POA: Diagnosis not present

## 2018-01-18 DIAGNOSIS — D649 Anemia, unspecified: Secondary | ICD-10-CM

## 2018-01-18 DIAGNOSIS — R7402 Elevation of levels of lactic acid dehydrogenase (LDH): Secondary | ICD-10-CM

## 2018-01-18 DIAGNOSIS — I10 Essential (primary) hypertension: Secondary | ICD-10-CM | POA: Diagnosis not present

## 2018-01-18 DIAGNOSIS — Z79899 Other long term (current) drug therapy: Secondary | ICD-10-CM | POA: Insufficient documentation

## 2018-01-18 DIAGNOSIS — E039 Hypothyroidism, unspecified: Secondary | ICD-10-CM

## 2018-01-18 DIAGNOSIS — C55 Malignant neoplasm of uterus, part unspecified: Secondary | ICD-10-CM

## 2018-01-18 DIAGNOSIS — D72819 Decreased white blood cell count, unspecified: Secondary | ICD-10-CM

## 2018-01-18 DIAGNOSIS — D4959 Neoplasm of unspecified behavior of other genitourinary organ: Secondary | ICD-10-CM

## 2018-01-18 LAB — CMP (CANCER CENTER ONLY)
ALT: 12 U/L (ref 0–55)
AST: 23 U/L (ref 5–34)
Albumin: 3.6 g/dL (ref 3.5–5.0)
Alkaline Phosphatase: 62 U/L (ref 40–150)
Anion gap: 6 (ref 3–11)
BUN: 17 mg/dL (ref 7–26)
CHLORIDE: 105 mmol/L (ref 98–109)
CO2: 27 mmol/L (ref 22–29)
CREATININE: 1.12 mg/dL — AB (ref 0.60–1.10)
Calcium: 9.4 mg/dL (ref 8.4–10.4)
GFR, EST AFRICAN AMERICAN: 46 mL/min — AB (ref >60)
GFR, EST NON AFRICAN AMERICAN: 40 mL/min — AB (ref >60)
Glucose, Bld: 85 mg/dL (ref 70–140)
Potassium: 4 mmol/L (ref 3.5–5.1)
Sodium: 138 mmol/L (ref 136–145)
TOTAL PROTEIN: 8.2 g/dL (ref 6.4–8.3)
Total Bilirubin: 0.6 mg/dL (ref 0.2–1.2)

## 2018-01-18 LAB — CBC WITH DIFFERENTIAL/PLATELET
BASOS ABS: 0 10*3/uL (ref 0.0–0.1)
BASOS PCT: 0 %
EOS ABS: 0 10*3/uL (ref 0.0–0.5)
Eosinophils Relative: 2 %
HCT: 29.9 % — ABNORMAL LOW (ref 34.8–46.6)
HEMOGLOBIN: 9.8 g/dL — AB (ref 11.6–15.9)
LYMPHS ABS: 0.9 10*3/uL (ref 0.9–3.3)
Lymphocytes Relative: 35 %
MCH: 31.4 pg (ref 25.1–34.0)
MCHC: 32.8 g/dL (ref 31.5–36.0)
MCV: 95.8 fL (ref 79.5–101.0)
Monocytes Absolute: 0.3 10*3/uL (ref 0.1–0.9)
Monocytes Relative: 9 %
Neutro Abs: 1.4 10*3/uL — ABNORMAL LOW (ref 1.5–6.5)
Neutrophils Relative %: 54 %
Platelets: 198 10*3/uL (ref 145–400)
RBC: 3.12 MIL/uL — AB (ref 3.70–5.45)
RDW: 15.4 % — ABNORMAL HIGH (ref 11.2–14.5)
WBC: 2.7 10*3/uL — AB (ref 3.9–10.3)

## 2018-01-18 LAB — LACTATE DEHYDROGENASE: LDH: 290 U/L — AB (ref 125–245)

## 2018-01-18 LAB — RETICULOCYTES
RBC.: 3.12 MIL/uL — ABNORMAL LOW (ref 3.70–5.45)
RETIC COUNT ABSOLUTE: 59.3 10*3/uL (ref 33.7–90.7)
RETIC CT PCT: 1.9 % (ref 0.7–2.1)

## 2018-01-18 NOTE — Telephone Encounter (Signed)
Appointments scheduled AVS/Calendar printed per 4/2 los °

## 2018-01-20 LAB — MULTIPLE MYELOMA PANEL, SERUM
ALBUMIN/GLOB SERPL: 0.9 (ref 0.7–1.7)
ALPHA2 GLOB SERPL ELPH-MCNC: 0.5 g/dL (ref 0.4–1.0)
Albumin SerPl Elph-Mcnc: 3.6 g/dL (ref 2.9–4.4)
Alpha 1: 0.2 g/dL (ref 0.0–0.4)
B-Globulin SerPl Elph-Mcnc: 1.2 g/dL (ref 0.7–1.3)
Gamma Glob SerPl Elph-Mcnc: 2.6 g/dL — ABNORMAL HIGH (ref 0.4–1.8)
Globulin, Total: 4.5 g/dL — ABNORMAL HIGH (ref 2.2–3.9)
IGG (IMMUNOGLOBIN G), SERUM: 2087 mg/dL — AB (ref 700–1600)
IgA: 338 mg/dL (ref 64–422)
IgM (Immunoglobulin M), Srm: 912 mg/dL — ABNORMAL HIGH (ref 26–217)
Total Protein ELP: 8.1 g/dL (ref 6.0–8.5)

## 2018-01-21 ENCOUNTER — Inpatient Hospital Stay (HOSPITAL_BASED_OUTPATIENT_CLINIC_OR_DEPARTMENT_OTHER): Payer: Medicare Other | Admitting: Gynecologic Oncology

## 2018-01-21 ENCOUNTER — Encounter (HOSPITAL_COMMUNITY): Payer: Self-pay | Admitting: Radiology

## 2018-01-21 ENCOUNTER — Encounter: Payer: Self-pay | Admitting: Radiation Oncology

## 2018-01-21 ENCOUNTER — Encounter: Payer: Self-pay | Admitting: Gynecologic Oncology

## 2018-01-21 ENCOUNTER — Ambulatory Visit (HOSPITAL_COMMUNITY)
Admission: RE | Admit: 2018-01-21 | Discharge: 2018-01-21 | Disposition: A | Discharge status: Home / Self Care | Payer: Medicare Other | Source: Ambulatory Visit | Attending: Hematology | Admitting: Hematology

## 2018-01-21 VITALS — BP 140/59 | HR 70 | Temp 98.1°F | Resp 20 | Ht 62.0 in | Wt 135.0 lb

## 2018-01-21 DIAGNOSIS — D4959 Neoplasm of unspecified behavior of other genitourinary organ: Secondary | ICD-10-CM | POA: Insufficient documentation

## 2018-01-21 DIAGNOSIS — C7982 Secondary malignant neoplasm of genital organs: Secondary | ICD-10-CM | POA: Diagnosis not present

## 2018-01-21 DIAGNOSIS — C541 Malignant neoplasm of endometrium: Secondary | ICD-10-CM

## 2018-01-21 DIAGNOSIS — I2510 Atherosclerotic heart disease of native coronary artery without angina pectoris: Secondary | ICD-10-CM | POA: Diagnosis not present

## 2018-01-21 DIAGNOSIS — I700 Atherosclerosis of aorta: Secondary | ICD-10-CM | POA: Insufficient documentation

## 2018-01-21 DIAGNOSIS — C801 Malignant (primary) neoplasm, unspecified: Secondary | ICD-10-CM

## 2018-01-21 DIAGNOSIS — C55 Malignant neoplasm of uterus, part unspecified: Secondary | ICD-10-CM | POA: Diagnosis not present

## 2018-01-21 DIAGNOSIS — R918 Other nonspecific abnormal finding of lung field: Secondary | ICD-10-CM | POA: Diagnosis not present

## 2018-01-21 MED ORDER — IOHEXOL 300 MG/ML  SOLN
75.0000 mL | Freq: Once | INTRAMUSCULAR | Status: AC | PRN
  Administered 2018-01-21: 75 mL via INTRAVENOUS

## 2018-01-21 NOTE — Progress Notes (Signed)
Consult Note: Gyn-Onc  Consult was requested by [name] for the evaluation of Wendy [name] 84 y.o. female  CC:  Chief Complaint  Patient presents with  . Papillary carcinoma [facility])    Assessment/Plan:  Wendy [name]  is a 84 y.o.  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  I discussed with Wendy [name] and Wendy [name] my impression after examination.  I discussed that she had a very locally advanced endometrial cancer.  The CT scan does not show apparent widespread metastases at this point.  I  discuss that the cell type is associated with a high risk for distant relapse.  Due to the locally advanced disease she is not a candidate for primary surgery.  I feel that primary chemoradiation is the best option for Wendy as this will palliate Wendy symptoms of bleeding and is potentially curative.  I would recommend whole pelvic radiation with intracavitary brachii therapy with the addition of cisplatin on days 1 and 28 (50 mg/m).  Typically this would be followed by 4 cycles of carboplatin and paclitaxel, particularly given this high-grade histology and the high risk for distant relapse.  However, given Ms. Colton's advanced age, and pre-existing bone marrow dysfunction, it is unclear if this aggressive therapy would be tolerated by the patient.  I will see Wendy back after completing primary chemoradiation for repeat discussion with Wendy and Wendy [name] about the potential benefits of additional chemotherapy.   HPI: Wendy [name] is a 84 year old P2 who is seen in consultation at the request of Dr Nelda Marseille for locally advanced high grade serous uterine adenocarcinoma.  The patient reports having episodes of bleeding in February or March 2019.  She was seen and evaluated at [name] office.  An ultrasound was attempted on January 13, 2018 but this was unsuccessful due to patient intolerance of the examination.  Instead of physical examination including speculum  examination was performed, and according to [name] note the cervix appeared grossly abnormal with a friable foul-smelling lesion located on the anterior portion within the cervical canal.  This was biopsied on 12/28/2017 which revealed papillary serous carcinoma.  The patient has been followed by [name] for anemia since November 2018.  The etiology of this is been unclear and there was a possibility that she may have underlying MDS.  [name] had ordered a CT abdo/pelvis on 01/21/18 which showed: Abnormal appearance of the uterus, with central hypoattenuation likely related to the clinical history of endometrial cancer. No evidence of metastatic disease or lymphoma within the chest, abdomen, or pelvis. Nonspecific right-sided pulmonary nodules. Coronary artery atherosclerosis. Aortic Atherosclerosis. Pulmonary artery enlargement suggests pulmonary arterial hypertension.  She lives with Wendy [name]. She is independent in ADL's.   Current Meds:  Outpatient Encounter Medications as of 01/21/2018  Medication Sig  . citalopram (CELEXA) 10 MG tablet Take 5 mg daily after supper by mouth.   . ENSURE (ENSURE) Take 237 mLs by mouth 2 (two) times daily between meals.  . hydrocortisone cream 1 % Apply to affected area 2 times daily  . levothyroxine (SYNTHROID, LEVOTHROID) 75 MCG tablet Take 75 mcg by mouth daily before breakfast.  . losartan (COZAAR) 50 MG tablet Take 50 mg by mouth daily.  Marland Kitchen triamcinolone (KENALOG) 0.025 % cream Apply 1 application 2 (two) times daily as needed topically (rash).  . Vitamin D, Ergocalciferol, (DRISDOL) 50000 units CAPS capsule Take 50,000 Units every 7 (seven) days by mouth.  Marland Kitchen aspirin  EC 81 MG tablet Take 81 mg daily after supper by mouth.    No facility-administered encounter medications on file as of 01/21/2018.     Allergy: No Known Allergies  Social Hx:   Social History   Socioeconomic History  . Marital status: Widowed    Spouse name: Not on file  . Number  of children: Not on file  . Years of education: Not on file  . Highest education level: Not on file  Occupational History  . Not on file  Social Needs  . Financial resource strain: Not on file  . Food insecurity:    Worry: Not on file    Inability: Not on file  . Transportation needs:    Medical: Not on file    Non-medical: Not on file  Tobacco Use  . Smoking status: Never Smoker  . Smokeless tobacco: Never Used  Substance and Sexual Activity  . Alcohol use: No  . Drug use: No  . Sexual activity: Never  Lifestyle  . Physical activity:    Days per week: Not on file    Minutes per session: Not on file  . Stress: Not on file  Relationships  . Social connections:    Talks on phone: Not on file    Gets together: Not on file    Attends religious service: Not on file    Active member of club or organization: Not on file    Attends meetings of clubs or organizations: Not on file    Relationship status: Not on file  . Intimate partner violence:    Fear of current or ex partner: Not on file    Emotionally abused: Not on file    Physically abused: Not on file    Forced sexual activity: Not on file  Other Topics Concern  . Not on file  Social History Narrative  . Not on file    Past Surgical Hx:  Past Surgical History:  Procedure Laterality Date  . CORONARY ANGIOPLASTY      Past Medical Hx:  Past Medical History:  Diagnosis Date  . Hypertension   . Thyroid disease     Past Gynecological History:  SVD x 2 No LMP recorded. Patient is postmenopausal.  Family Hx: History reviewed. No pertinent family history.  Review of Systems:  Constitutional  Feels weak  ENT Normal appearing ears and nares bilaterally Skin/Breast  No rash, sores, jaundice, itching, dryness Cardiovascular  No chest pain, shortness of breath, or edema  Pulmonary  No cough or wheeze.  Gastro Intestinal  No nausea, vomitting, or diarrhoea. No bright red blood per rectum, no abdominal pain,  change in bowel movement, or constipation.  Genito Urinary  No frequency, urgency, dysuria, + vaginal bleeding Musculo Skeletal  No myalgia, arthralgia, joint swelling or pain  Neurologic  No weakness, numbness, change in gait,  Psychology  No depression, anxiety, insomnia.   Vitals:  Blood pressure (!) 140/59, pulse 70, temperature 98.1 F (36.7 C), temperature source Oral, resp. rate 20, height 5\' 2"  (1.575 m), weight 135 lb (61.2 kg), SpO2 100 %.  Physical Exam: WD in NAD Neck  Supple NROM, without any enlargements.  Lymph Node Survey No cervical supraclavicular or inguinal adenopathy Cardiovascular  Pulse normal rate, regularity and rhythm. S1 and S2 normal.  Lungs  Clear to auscultation bilateraly, without wheezes/crackles/rhonchi. Good air movement.  Skin  No rash/lesions/breakdown  Psychiatry  Alert and oriented to person, place, and time  Abdomen  Normoactive bowel sounds, abdomen  soft, non-tender and thin without evidence of hernia.  Back No CVA tenderness Genito Urinary  Vulva/vagina: Normal external female genitalia. No discharge or bleeding. There is a friable tumor mass (1cm) at the right vaginal introitus.   Bladder/urethra:  No lesions or masses, well supported bladder  Vagina: there are multiple tumor masses 1-2cm along bilateral vaginal sidewalls appearing consistent with drop mets  Cervix: replaced by friable endometrial tumor  Uterus:  Small, mobile, no parametrial involvement or nodularity.  Adnexa: no discrete masses. Rectal  Deferred due to patient discomfort. Extremities  No bilateral cyanosis, clubbing or edema.   Thereasa Solo, MD  01/21/2018, 5:43 PM

## 2018-01-21 NOTE — Patient Instructions (Signed)
Plan on meeting with Dr. Gery Pray with Radiation Oncology on April 15th at 8:30am with arrival at the [facility].  You will receive a phone call from Dr. Grier Mitts office to arrange for an appointment to meet with him to discuss chemotherapy.  Please call for any needs.

## 2018-01-24 NOTE — Progress Notes (Signed)
HEMATOLOGY ONCOLOGY CLINIC NOTE  Date of Service: 01/26/18  Patient Care Team: Lajean Manes, MD as PCP - General (Internal Medicine)  CHIEF COMPLAINTS/PURPOSE OF CONSULTATION:  Anemia Newly diagnosed uterine papillary carcinoma.  HISTORY OF PRESENTING ILLNESS:   Wendy [name] 84 y.o. female is here because of worsening anemia.  Patient has a history of hypertension and hypothyroidism who as per her daughter has been a very active person until recently. Her daughter notes that patient's levothyroxine dose was reduced from 75 mcg to 50 mcg daily and this produced a significant change in the patient's cognition and energy levels.  She reports that it is very unusual for the patient to wake up late and then sleep throughout the day as she is currently doing. Per the patient's daughter she was a very active person, who would do a lot of walking, including walking around the track.   She notes that the patient has been slowly declining over the past year ever since seeing a new provider who started decreasing her thyroid medication. The patient's appetite has been declining as well.    She was seen in the ED for two syncopal episodes. On 09/04/2018 .  She was noted to be hypotensive and hypoxic.  Symptoms were thought to be related to dehydration due to poor oral intake.  Echo showed normal ejection fraction of 60-65% with grade 2 diastolic dysfunction.  Her losartan dose was significantly reduced on discharge.  She was noted to have several episodes of nonsustained VT but her heart rate was in the 50s.  Hemoglobin was noted to be between 7 and 7.5 but transfusion was withheld due to presence of positive Coombs test and outpatient hematology consult was recommended. FOBT was never sent.  She was found to have abnormal CBC from 09/07/2017 -with hemoglobin of 7 MCV of 95, WBC count of 3k with an ANC of 2.3k.  And a platelet count of 175k.  Reticulocyte percentage of 1.9, absolute  reticulocyte count of 45.8k/ul .  She was noted to have a positive Coombs test but her LDH and haptoglobin levels were within normal limits suggesting against overt hemolysis.  ANA negative. Sedimentation rate 138.  CRP within normal limits. Free T4 0.87 Ferritin 857 with 23% iron saturation. Folate 19.5 B12 - 470  She denies recent chest pain on exertion, shortness of breath on minimal exertion, or palpitations. She denies cough. She had not noticed any recent bleeding such as epistaxis, hematuria or hematochezia.   The patient denies over the counter NSAID ingestion. She is not on antiplatelets agents.   She had no prior history or diagnosis of cancer. Her age appropriate screening programs are up-to-date.  She denies any pica and eats a variety of diet.  She never donated blood or received blood transfusion.   Some days her appetite is better than others. The patients daughter states she has also become more forgetful at times.  Pt denies fever, chills, trouble swelling, abdominal pain, or any other symptoms or complaints at this time.  Patient really knows that she still feels somewhat fatigued but has no overt lightheadedness or dizziness at this time and feels better than when she was admitted. No overt infectious issues. No overt evidence of bleeding.  Some unquantified weight loss due to decreased appetite. No other overt focal symptoms.  Interval History:   Wendy [name] is here regarding her borderline macrocytic anemia  and leucopenia. And her newly diagnosed uterine high grade papillary caricnoma.  The patient's last  visit with [place] was on 01/18/18. She is accompanied today by her daughter. The pt reports that she is doing well overall.   The pt reports that she saw OBGYN Dr [name] on 01/21/18.  The pt will also be having a consult with Dr. Gery Pray in radiology on 01/31/18.   Of note since the patient's last visit, pt has had CT C/A/P completed on 01/21/18 with results  revealing 1. Abnormal appearance of the uterus, with central hypoattenuation likely related to the clinical history of endometrial cancer. 2. No evidence of metastatic disease or lymphoma within the chest, abdomen, or pelvis. 3. Nonspecific right-sided pulmonary nodules. 4. Coronary artery atherosclerosis. Aortic Atherosclerosis (ICD10-I70.0). 5. Pulmonary artery enlargement suggests pulmonary arterial Hypertension.  Lab results (01/18/18) of CBC, CMP, and Reticulocytes is as follows: all values are WNL except for WBC at 2.7k, RBC at 3.12, Hgb at 9.8, HCT at 29.9, RDW at 15.4, Neutro Abs at 1.4k, Creatinine at 1.12. MMP 01/18/18 revealed all values WNL except for IgG at 2087, IgM at 912, Gamma Glob at 2.6, Globulin Total at 4.5. LDH 01/18/18 was elevated at 290.   We has previously discussed her biopsy findings of high grade uterine papillary carcinoma. We reviewed staging and treatment options as recommended by Dr Denman George and based on her goals of care and functional status and age. I recommended against palliative chemotherapy at this time. On review of systems, pt reports frequent feelings of sleepiness, persisting constipation, and denies vaginal bleeding, vaginal discomfort, and any other symptoms.   MEDICAL HISTORY:  Past Medical History:  Diagnosis Date  . Hypertension   . Thyroid disease   Nonsustained VT Hypothyroidism Acute kidney injury 08/2017 Depression-on Celexa.  SURGICAL HISTORY: Past Surgical History:  Procedure Laterality Date  . CORONARY ANGIOPLASTY      SOCIAL HISTORY: Social History   Socioeconomic History  . Marital status: Widowed    Spouse name: Not on file  . Number of children: Not on file  . Years of education: Not on file  . Highest education level: Not on file  Occupational History  . Not on file  Social Needs  . Financial resource strain: Not on file  . Food insecurity:    Worry: Not on file    Inability: Not on file  . Transportation needs:     Medical: Not on file    Non-medical: Not on file  Tobacco Use  . Smoking status: Never Smoker  . Smokeless tobacco: Never Used  Substance and Sexual Activity  . Alcohol use: No  . Drug use: No  . Sexual activity: Never  Lifestyle  . Physical activity:    Days per week: Not on file    Minutes per session: Not on file  . Stress: Not on file  Relationships  . Social connections:    Talks on phone: Not on file    Gets together: Not on file    Attends religious service: Not on file    Active member of club or organization: Not on file    Attends meetings of clubs or organizations: Not on file    Relationship status: Not on file  . Intimate partner violence:    Fear of current or ex partner: Not on file    Emotionally abused: Not on file    Physically abused: Not on file    Forced sexual activity: Not on file  Other Topics Concern  . Not on file  Social History Narrative  . Not on  file    FAMILY HISTORY: No family history on file.  ALLERGIES:  has No Known Allergies.  MEDICATIONS:  Current Outpatient Medications  Medication Sig Dispense Refill  . aspirin EC 81 MG tablet Take 81 mg daily after supper by mouth.     . citalopram (CELEXA) 10 MG tablet Take 5 mg daily after supper by mouth.     . ENSURE (ENSURE) Take 237 mLs by mouth 2 (two) times daily between meals.    . hydrocortisone cream 1 % Apply to affected area 2 times daily 15 g 0  . levothyroxine (SYNTHROID, LEVOTHROID) 75 MCG tablet Take 75 mcg by mouth daily before breakfast.    . losartan (COZAAR) 50 MG tablet Take 50 mg by mouth daily.    Marland Kitchen triamcinolone (KENALOG) 0.025 % cream Apply 1 application 2 (two) times daily as needed topically (rash).    . Vitamin D, Ergocalciferol, (DRISDOL) 50000 units CAPS capsule Take 50,000 Units every 7 (seven) days by mouth.     No current facility-administered medications for this visit.     REVIEW OF SYSTEMS:    .10 Point review of Systems was done is negative except as  noted above.   PHYSICAL EXAMINATION: ECOG PERFORMANCE STATUS: 2 - Symptomatic, <50% confined to bed  Vitals:   01/26/18 1335  BP: (!) 168/82  Pulse: 77  Resp: 17  Temp: 98.6 F (37 C)  SpO2: 99%   Filed Weights   01/26/18 1335  Weight: 134 lb 6.4 oz (61 kg)    GENERAL:alert, in no acute distress and comfortable SKIN: no acute rashes, no significant lesions EYES: conjunctiva are pink and non-injected, sclera anicteric OROPHARYNX: MMM, no exudates, no oropharyngeal erythema or ulceration NECK: supple, no JVD LYMPH:  no palpable lymphadenopathy in the cervical, axillary or inguinal regions LUNGS: clear to auscultation b/l with normal respiratory effort HEART: regular rate & rhythm ABDOMEN:  normoactive bowel sounds , non tender, not distended. Extremity: no pedal edema PSYCH: alert & oriented x 3 with fluent speech NEURO: no focal motor/sensory deficits    LABORATORY DATA:  I have reviewed the data as listed  . CBC Latest Ref Rng & Units 01/18/2018 12/17/2017 10/28/2017  WBC 3.9 - 10.3 K/uL 2.7(L) 2.7(L) 2.7(L)  Hemoglobin 11.6 - 15.9 g/dL 9.8(L) 9.5(L) 9.5(L)  Hematocrit 34.8 - 46.6 % 29.9(L) 29.5(L) 29.0(L)  Platelets 145 - 400 K/uL 198 217 214   . CMP Latest Ref Rng & Units 01/18/2018 12/17/2017 10/28/2017  Glucose 70 - 140 mg/dL 85 89 84  BUN 7 - 26 mg/dL 17 18 15   Creatinine 0.60 - 1.10 mg/dL 1.12(H) 1.10(H) 1.15(H)  Sodium 136 - 145 mmol/L 138 138 139  Potassium 3.5 - 5.1 mmol/L 4.0 4.2 4.1  Chloride 98 - 109 mmol/L 105 108 104  CO2 22 - 29 mmol/L 27 21(L) 26  Calcium 8.4 - 10.4 mg/dL 9.4 8.9 9.5  Total Protein 6.4 - 8.3 g/dL 8.2 7.8 8.8(H)  Total Bilirubin 0.2 - 1.2 mg/dL 0.6 0.8 0.8  Alkaline Phos 40 - 150 U/L 62 54 66  AST 5 - 34 U/L 23 25 25   ALT 0 - 55 U/L 12 11(L) 11    Component     Latest Ref Rng & Units 10/28/2017 10/28/2017 10/28/2017        12:40 PM 12:42 PM 12:45 PM  WBC Count     3.9 - 10.3 K/uL   2.7 (L)  RBC     3.70 - 5.45 MIL/uL  2.95 (L)    Hemoglobin     11.6 - 15.9 g/dL   9.5 (L)  HCT     34.8 - 46.6 %  27.3 (L) 29.0 (L)  MCV     79.5 - 101.0 fL   98.3  MCH     25.1 - 34.0 pg   32.2  MCHC     31.5 - 36.0 g/dL   32.8  RDW     11.2 - 16.1 %   16.1  Platelet Count     145 - 400 K/uL   214  Neutrophils     %   56  NEUT#     1.5 - 6.5 K/uL   1.5  Lymphocytes     %   34  Lymphocyte #     0.9 - 3.3 K/uL   0.9  Monocytes Relative     %   8  Monocyte #     0.1 - 0.9 K/uL   0.2  Eosinophil     %   1  Eosinophils Absolute     0.0 - 0.5 K/uL   0.0  Basophil     %   1  Basophils Absolute     0.0 - 0.1 K/uL   0.0  Sodium     136 - 145 mmol/L 139    Potassium     3.3 - 4.7 mmol/L 4.1    Chloride     98 - 109 mmol/L 104    CO2     22 - 29 mmol/L 26    Glucose     70 - 140 mg/dL 84    BUN     7 - 26 mg/dL 15    Creatinine     0.60 - 1.10 mg/dL 1.15 (H)    Calcium     8.4 - 10.4 mg/dL 9.5    Total Protein     6.4 - 8.3 g/dL 8.8 (H)    Albumin     3.5 - 5.0 g/dL 3.9    AST     5 - 34 U/L 25    ALT     0 - 55 U/L 11    Alkaline Phosphatase     40 - 150 U/L 66    Total Bilirubin     0.2 - 1.2 mg/dL 0.8    GFR, Est Non Af Am     >60 mL/min 38 (L)    GFR, Est AFR Am     >60 mL/min 44 (L)    Anion gap     3 - 11 9    TSH     0.450 - 4.500 uIU/mL 8.990 (H)    Thyroxine (T4)     4.5 - 12.0 ug/dL 7.1    T3 Uptake Ratio     24 - 39 % 24    Free Thyroxine Index     1.2 - 4.9 1.7    Kappa free light chain     3.3 - 19.4 mg/L 65.8 (H)    Lamda free light chains     5.7 - 26.3 mg/L 40.1 (H)    Kappa, lamda light chain ratio     0.26 - 1.65 1.64    Folate, Hemolysate     Not Estab. ng/mL  473.7   Folate, RBC     >498 ng/mL  1,735   Retic Ct Pct     0.7 - 2.1 %   2.3 (H)  RBC.  3.70 - 5.45 MIL/uL   2.95 (L)  Retic Count, Absolute     33.7 - 90.7 K/uL   67.9  Lactic Acid, Venous     0.5 - 1.9 mmol/L     Comment          Copper     72 - 166 ug/dL 117    Erythropoietin     2.6 - 18.5  mIU/mL 33.8 (H)    Haptoglobin     34 - 200 mg/dL 39    LDH     125 - 245 U/L 297 (H)    Homocysteine     0.0 - 15.0 umol/L  16.9 (H)   Vitamin B12     180 - 914 pg/mL   522        RADIOGRAPHIC STUDIES: I have personally reviewed the radiological images as listed and agreed with the findings in the report. Ct Chest W Contrast  Result Date: 01/21/2018 CLINICAL DATA:  Uterine papillary carcinoma. Staging. Possible lymphoma. Anemia. EXAM: CT CHEST, ABDOMEN, AND PELVIS WITH CONTRAST TECHNIQUE: Multidetector CT imaging of the chest, abdomen and pelvis was performed following the standard protocol during bolus administration of intravenous contrast. CONTRAST:  66mL OMNIPAQUE IOHEXOL 300 MG/ML  SOLN COMPARISON:  Chest radiograph 09/04/2017.  No prior CT. FINDINGS: CT CHEST FINDINGS Cardiovascular: Advanced aortic and branch vessel atherosclerosis. Tortuous thoracic aorta. Moderate cardiomegaly, without pericardial effusion. Multivessel coronary artery atherosclerosis. Pulmonary artery enlargement, outflow tract 3.2 cm. No central pulmonary embolism, on this non-dedicated study. Mediastinum/Nodes: No supraclavicular adenopathy. No axillary adenopathy. Borderline precarinal node at 10 mm on image 27/2. No hilar adenopathy. Tiny hiatal hernia. Lungs/Pleura: No pleural fluid. Scattered right-sided pulmonary nodules, including at 4 mm in the right upper lobe on image 29/4, 3 mm in the posterior right upper lobe on image 31/4, 3 mm in the right lower lobe on image 48/4. Calcified right lower lobe granuloma. Musculoskeletal: Multilevel spondylosis. CT ABDOMEN PELVIS FINDINGS Hepatobiliary: Medial left liver lobe 9 mm low-density lesion is well-circumscribed with may represent a minimally complex cyst. Borderline intrahepatic biliary duct dilatation is most likely within normal variation for age. The common duct is normal for age at 9 mm. Pancreas: Normal, without mass or ductal dilatation. Spleen: Normal in  size, without focal abnormality. Adrenals/Urinary Tract: Normal adrenal glands. Bilateral renal cysts and too small to characterize lesions. No hydronephrosis. Normal urinary bladder. Stomach/Bowel: Proximal gastric underdistention. Extensive colonic diverticulosis. Normal terminal ileum and appendix. Normal small bowel. Vascular/Lymphatic: Advanced aortic and branch vessel atherosclerosis. No abdominopelvic adenopathy. Reproductive: Central uterine hypoattenuation, including at 2.7 cm on sagittal image 93/6. No adnexal mass. Other: No significant free fluid. Mild pelvic floor laxity. No evidence of omental or peritoneal disease. Musculoskeletal: Lumbosacral spondylosis with degenerative disc disease at the lumbosacral junction. IMPRESSION: 1. Abnormal appearance of the uterus, with central hypoattenuation likely related to the clinical history of endometrial cancer. 2. No evidence of metastatic disease or lymphoma within the chest, abdomen, or pelvis. 3. Nonspecific right-sided pulmonary nodules. 4. Coronary artery atherosclerosis. Aortic Atherosclerosis (ICD10-I70.0). 5. Pulmonary artery enlargement suggests pulmonary arterial hypertension. Electronically Signed   By: Abigail Miyamoto M.D.   On: 01/21/2018 11:52   Ct Abdomen Pelvis W Contrast  Result Date: 01/21/2018 CLINICAL DATA:  Uterine papillary carcinoma. Staging. Possible lymphoma. Anemia. EXAM: CT CHEST, ABDOMEN, AND PELVIS WITH CONTRAST TECHNIQUE: Multidetector CT imaging of the chest, abdomen and pelvis was performed following the standard protocol during bolus administration of intravenous contrast. CONTRAST:  20mL OMNIPAQUE  IOHEXOL 300 MG/ML  SOLN COMPARISON:  Chest radiograph 09/04/2017.  No prior CT. FINDINGS: CT CHEST FINDINGS Cardiovascular: Advanced aortic and branch vessel atherosclerosis. Tortuous thoracic aorta. Moderate cardiomegaly, without pericardial effusion. Multivessel coronary artery atherosclerosis. Pulmonary artery enlargement, outflow  tract 3.2 cm. No central pulmonary embolism, on this non-dedicated study. Mediastinum/Nodes: No supraclavicular adenopathy. No axillary adenopathy. Borderline precarinal node at 10 mm on image 27/2. No hilar adenopathy. Tiny hiatal hernia. Lungs/Pleura: No pleural fluid. Scattered right-sided pulmonary nodules, including at 4 mm in the right upper lobe on image 29/4, 3 mm in the posterior right upper lobe on image 31/4, 3 mm in the right lower lobe on image 48/4. Calcified right lower lobe granuloma. Musculoskeletal: Multilevel spondylosis. CT ABDOMEN PELVIS FINDINGS Hepatobiliary: Medial left liver lobe 9 mm low-density lesion is well-circumscribed with may represent a minimally complex cyst. Borderline intrahepatic biliary duct dilatation is most likely within normal variation for age. The common duct is normal for age at 9 mm. Pancreas: Normal, without mass or ductal dilatation. Spleen: Normal in size, without focal abnormality. Adrenals/Urinary Tract: Normal adrenal glands. Bilateral renal cysts and too small to characterize lesions. No hydronephrosis. Normal urinary bladder. Stomach/Bowel: Proximal gastric underdistention. Extensive colonic diverticulosis. Normal terminal ileum and appendix. Normal small bowel. Vascular/Lymphatic: Advanced aortic and branch vessel atherosclerosis. No abdominopelvic adenopathy. Reproductive: Central uterine hypoattenuation, including at 2.7 cm on sagittal image 93/6. No adnexal mass. Other: No significant free fluid. Mild pelvic floor laxity. No evidence of omental or peritoneal disease. Musculoskeletal: Lumbosacral spondylosis with degenerative disc disease at the lumbosacral junction. IMPRESSION: 1. Abnormal appearance of the uterus, with central hypoattenuation likely related to the clinical history of endometrial cancer. 2. No evidence of metastatic disease or lymphoma within the chest, abdomen, or pelvis. 3. Nonspecific right-sided pulmonary nodules. 4. Coronary artery  atherosclerosis. Aortic Atherosclerosis (ICD10-I70.0). 5. Pulmonary artery enlargement suggests pulmonary arterial hypertension. Electronically Signed   By: Abigail Miyamoto M.D.   On: 01/21/2018 11:52    ASSESSMENT & PLAN:   84 yo   1) Normocytic Anemia likely multifactorial  Likely MDS + blood loss from uterine high grade papillary carcinoma + Anemia of chronic disease related to newly diagnosed malignancy.  Patient's hemoglobin is now improved from 7.5 to 9.5 to 9.8 Slightly elevated LDH level at 297 but normal haptoglobin suggesting against acute hemolysis.   Homocystine is elevated though B12 level is within normal limits and RBC folate- WNL  10/28/17 MM panel showed no M-Protein, with elevated IgG and IgM. Kappa and lambda light chain were both elevated- likely reactive to newly diagnosed malignancy. No overt evidence of GI bleeding.  No evidence of iron deficiency. Cannot rule out MDS given the patient's advanced age.  2) leukopenia with mild neutropenia 1.3k  3) Elevated TSH with normal FT4 -TSH is Elevated at 8.99 (normal FT4) in the setting of recent decrease in levothyroxine dose from 35mcg to 93mcg per PCP. Now back at 17mcg po daily.  4) Elevated LDH levels -no overt evidence of hemolysis given normal haptoglobin level and no reticulocytosis and normal bilirubin levels.  Likely from uterine papillary carcinoma 5) Positive direct Coombs test - no evidence of overt hemolysis at this time.  6) Newly diagnosed high grade papillary uterine carcinoma . CT with no evidence of metastatic disease PLAN  Discussed pt labwork today; blood counts and chemistries are stable. Hgb at 9.8.  -Reviewed 01/21/18 CT with the pt which revealed Abnormal appearance of the uterus, with central hypoattenuation likely related to the  clinical history of endometrial cancer. No evidence of metastatic disease or lymphoma within the chest, abdomen, or pelvis. Nonspecific right-sided pulmonary  nodules. -Discussed the findings of Papillary serous carcinoma again with pt and her daughter in light of the recent CT scan.  -appreciate Dr Serita Grit input -- not a surgical candidate. -Discussed that we would not recommend chemotherapy as the risk of complications and acute toxicity is quite large given her age, and limited bone marrow reserve and would likely decrease the quality of her life. -Discussed the national cancer guidelines with the pt and her daughter including our recommendation of either purely comfort measures or palliative uterine RT -patient has an appointment with Dr Sondra Come in the next few days.  -Offered a referral for home health services which will likely transition to palliative of hopsice depending on goals of care and disease progression -We would like to see pt back in 4 weeks.  -No indication for PRBC transfusion at this time -continue B complex.  -Daughter is very involved and will try to optimize the patient's oral intake. - 5) . Patient Active Problem List   Diagnosis Date Noted  . Syncope 09/28/2013  . Altered mental status 09/26/2013  . Syncope and collapse 09/26/2013  . Hypothyroidism 09/26/2013  . Depression 09/26/2013  . HTN (hypertension) 09/26/2013  Plan -continue f/u with PCP   F/u with Radiation Oncology Dr Sondra Come as per appointment on 01/31/2018 RTC with Dr Irene Limbo in 4 weeks with labs Referral  for home care services    . The total time spent in the appointment was 40 minutes and more than 50% was on counseling and direct patient cares.   Sullivan Lone MD [place] AAHIVMS [facility] Hematology/Oncology Physician [facility]  (Office):       [phone] (Work cell):  [phone] (Fax):           [phone]    This document serves as a record of services personally performed by Sullivan Lone, MD. It was created on his behalf by Baldwin Jamaica, a trained medical scribe. The creation of this record is based on the scribe's personal  observations and the provider's statements to them.   .I have reviewed the above documentation for accuracy and completeness, and I agree with the above. Brunetta Genera MD MS

## 2018-01-25 ENCOUNTER — Telehealth: Payer: Self-pay

## 2018-01-25 ENCOUNTER — Ambulatory Visit (HOSPITAL_COMMUNITY): Payer: Medicare Other

## 2018-01-25 NOTE — Telephone Encounter (Signed)
Pt daughter called back to confirm appt for tomorrow. Requested appt time change to 2pm, but [name] is on call. Offered 1pm or after on 01/28/18, but pt daughter said that she did not want to wait that long. Appt will stay as is for 01/26/18 at 1320.

## 2018-01-25 NOTE — Telephone Encounter (Signed)
Pt scheduled for f/u with [name] 01/26/18 at 1320. Request from GynOnc for pt to f/u with [name] soon to discuss chemotherapy. [name] would like to meet with the pt to discuss goals of care and treatment that the pt is the best cnadidate for. Call back number provided in pt VM [phone] along with the above information.

## 2018-01-26 ENCOUNTER — Inpatient Hospital Stay (HOSPITAL_BASED_OUTPATIENT_CLINIC_OR_DEPARTMENT_OTHER): Payer: Medicare Other | Admitting: Hematology

## 2018-01-26 ENCOUNTER — Telehealth: Payer: Self-pay | Admitting: Hematology

## 2018-01-26 VITALS — BP 168/82 | HR 77 | Temp 98.6°F | Resp 17 | Ht 62.0 in | Wt 134.4 lb

## 2018-01-26 DIAGNOSIS — D649 Anemia, unspecified: Secondary | ICD-10-CM | POA: Diagnosis not present

## 2018-01-26 DIAGNOSIS — C55 Malignant neoplasm of uterus, part unspecified: Secondary | ICD-10-CM | POA: Diagnosis not present

## 2018-01-26 DIAGNOSIS — D72819 Decreased white blood cell count, unspecified: Secondary | ICD-10-CM

## 2018-01-26 DIAGNOSIS — Z79899 Other long term (current) drug therapy: Secondary | ICD-10-CM | POA: Diagnosis not present

## 2018-01-26 DIAGNOSIS — R5383 Other fatigue: Secondary | ICD-10-CM

## 2018-01-26 NOTE — Telephone Encounter (Signed)
Appt scheduled AVS/Calendar printed per 4/10 los °

## 2018-01-27 ENCOUNTER — Telehealth: Payer: Self-pay | Admitting: *Deleted

## 2018-01-27 NOTE — Telephone Encounter (Signed)
Per MD- patient needs home health referral to be made to [place].  Google search resulted no [place] agency currently serving [employer], permanently closed.  Referral made to [facility].  Requested information faxed to [phone].  Informed will probably have RN consult done Monday or Tuesday of next week.

## 2018-01-31 ENCOUNTER — Ambulatory Visit
Admission: RE | Admit: 2018-01-31 | Discharge: 2018-01-31 | Disposition: A | Discharge status: Home / Self Care | Payer: Medicare Other | Source: Ambulatory Visit | Attending: Radiation Oncology | Admitting: Radiation Oncology

## 2018-01-31 ENCOUNTER — Other Ambulatory Visit: Payer: Self-pay

## 2018-01-31 ENCOUNTER — Encounter: Payer: Self-pay | Admitting: Radiation Oncology

## 2018-01-31 DIAGNOSIS — M47817 Spondylosis without myelopathy or radiculopathy, lumbosacral region: Secondary | ICD-10-CM | POA: Diagnosis not present

## 2018-01-31 DIAGNOSIS — R918 Other nonspecific abnormal finding of lung field: Secondary | ICD-10-CM | POA: Diagnosis not present

## 2018-01-31 DIAGNOSIS — C55 Malignant neoplasm of uterus, part unspecified: Secondary | ICD-10-CM | POA: Insufficient documentation

## 2018-01-31 DIAGNOSIS — R55 Syncope and collapse: Secondary | ICD-10-CM | POA: Diagnosis not present

## 2018-01-31 DIAGNOSIS — I10 Essential (primary) hypertension: Secondary | ICD-10-CM | POA: Diagnosis not present

## 2018-01-31 DIAGNOSIS — I700 Atherosclerosis of aorta: Secondary | ICD-10-CM | POA: Diagnosis not present

## 2018-01-31 DIAGNOSIS — D649 Anemia, unspecified: Secondary | ICD-10-CM | POA: Diagnosis not present

## 2018-01-31 DIAGNOSIS — K5900 Constipation, unspecified: Secondary | ICD-10-CM | POA: Insufficient documentation

## 2018-01-31 DIAGNOSIS — E079 Disorder of thyroid, unspecified: Secondary | ICD-10-CM | POA: Insufficient documentation

## 2018-01-31 DIAGNOSIS — N281 Cyst of kidney, acquired: Secondary | ICD-10-CM | POA: Insufficient documentation

## 2018-01-31 DIAGNOSIS — R634 Abnormal weight loss: Secondary | ICD-10-CM | POA: Diagnosis not present

## 2018-01-31 DIAGNOSIS — M5137 Other intervertebral disc degeneration, lumbosacral region: Secondary | ICD-10-CM | POA: Insufficient documentation

## 2018-01-31 DIAGNOSIS — R630 Anorexia: Secondary | ICD-10-CM | POA: Insufficient documentation

## 2018-01-31 DIAGNOSIS — Z7982 Long term (current) use of aspirin: Secondary | ICD-10-CM | POA: Diagnosis not present

## 2018-01-31 DIAGNOSIS — Z79899 Other long term (current) drug therapy: Secondary | ICD-10-CM | POA: Insufficient documentation

## 2018-01-31 NOTE — Progress Notes (Signed)
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 female who presented with postmenopausal bleeding in February or March of 2019. She also had decreased appetite and fatigue over the past year. The [name] presented to the ED on 03/0/19 with vaginal bleeding. Pelvic exam showed a likely uterine prolapse and she was advised to follow-up with her OB/GYN. She presented to [name] on 01/13/18 and an ultrasound was attempted at that time.Per [name] note, it was unsuccessful due to [name] intolerance of exam. On physical exam, the cervix appeared grossly abnormal with a friable foul-smelling lesion located on the anterior portion within the cervical canal. Biopsy of the cervix on 12/28/17 showed papillary carcinoma, high grade.   The [name] has been followed by [name] for anemia since November 2018 after two episodes of syncope.  He ordered a CT of the abdomen and pelvis on 01/21/18. This showed abnormal appearance of the uterus with central hypoattenuation likely related to the clinical history of endometrial cancer. No evidence of metastatic disease or lymphoma within the chest, abdomen, or pelvis. Nonspecific right-sided pulmonary nodules noted.   The [name] presented to [name] on 01/21/18. Per her note, she reports the [name] is not a surgical candidate due to her locally advanced disease. She recommends primary chemoradiation. She would recommend whole pelvis radiation with intracavitary brachytherapy. The [name] presents with her daughter today on behalf of [name] for evaluation of radiotherapy as part of her treatment plan. The [name]'s daughter spoke on  the [name]'s behalf today.  The [name] complains of a 10 lb weight loss and decreased appetite. She notes changing her Depends 2-3 times per day due to bleeding. She reports rare pain in the lower abdomen. She complains of constipation. She denies nausea or vomiting. She denies dysuria or pain with bowel movements.   PREVIOUS RADIATION THERAPY: No  PAST MEDICAL HISTORY:  has a past medical history of Hypertension and Thyroid disease.    PAST SURGICAL HISTORY: Past Surgical History:  Procedure Laterality Date  . CORONARY ANGIOPLASTY      FAMILY HISTORY: family history is not on file.  SOCIAL HISTORY:  reports that she has never smoked. She has never used smokeless tobacco. She reports that she does not drink alcohol or use drugs. The [name]'s daughter reports she participates in light household chores and she is able to go up and down stairs.  ALLERGIES: [name] has no known allergies.  MEDICATIONS:  Current Outpatient Medications  Medication Sig Dispense Refill  . citalopram (CELEXA) 10 MG tablet Take 5 mg daily after supper by mouth.     . ENSURE (ENSURE) Take 237 mLs by mouth 2 (two) times daily between meals.    Marland Kitchen levothyroxine (SYNTHROID, LEVOTHROID) 75 MCG tablet Take 75 mcg by mouth daily before breakfast.    . losartan (COZAAR) 50 MG tablet Take 50 mg by mouth daily.    Marland Kitchen aspirin EC 81 MG tablet Take 81 mg daily after supper by mouth.     . hydrocortisone cream 1 % Apply to affected area 2 times daily ([name] not taking: Reported on 01/31/2018) 15 g 0  . triamcinolone (KENALOG) 0.025 % cream Apply  1 application 2 (two) times daily as needed topically (rash).    . Vitamin D, Ergocalciferol, (DRISDOL) 50000 units CAPS capsule Take 50,000 Units every 7 (seven) days by mouth.     No current facility-administered medications for this encounter.     REVIEW OF SYSTEMS: A 10+ POINT REVIEW OF SYSTEMS WAS OBTAINED including neurology, dermatology, psychiatry, cardiac, respiratory,  lymph, extremities, GI, GU, musculoskeletal, constitutional, reproductive, HEENT. All pertinent positives are noted in the HPI. All others are negative.    PHYSICAL EXAM:  weight is 134 lb (60.8 kg). Her oral temperature is 98.2 F (36.8 C). Her blood pressure is 172/86 (abnormal) and her pulse is 76. Her respiration is 20 and oxygen saturation is 100%.  General: Alert and oriented, in no acute distress HEENT: Head is normocephalic. Extraocular movements are intact. Oropharynx is clear. Dentures noted.  Neck: Neck is supple, no palpable cervical or supraclavicular lymphadenopathy. Heart: Regular in rate and rhythm with no murmurs, rubs, or gallops. Chest: Clear to auscultation bilaterally, with no rhonchi, wheezes, or rales. Abdomen: Soft, nontender, nondistended, with no rigidity or guarding. Extremities: No cyanosis or edema. Lymphatics: see Neck Exam Skin: No concerning lesions. Musculoskeletal: symmetric strength and muscle tone throughout. Neurologic: Cranial nerves II through XII are grossly intact. No obvious focalities. Speech is fluent. Coordination is intact. Psychiatric: Judgment and insight are intact. Affect is appropriate. On pelvic examination the external genitalia were unremarkable. A  speculum exam was performed. There were tumorous nodules extending down both lateral vaginal walls  close to the introitus. Limited view of the cervix showed a necrotic tumor. The [name] would not permit a rectal exam in light of her discomfort. No inguinal adenopathy.   ECOG = 2  0 - Asymptomatic (Fully active, able to carry on all predisease activities without restriction)  1 - Symptomatic but completely ambulatory (Restricted in physically strenuous activity but ambulatory and able to carry out work of a light or sedentary nature. For example, light housework, office work)  2 - Symptomatic, <50% in bed during the day (Ambulatory and capable of all self care but unable to carry out any  work activities. Up and about more than 50% of waking hours)  3 - Symptomatic, >50% in bed, but not bedbound (Capable of only limited self-care, confined to bed or chair 50% or more of waking hours)  4 - Bedbound (Completely disabled. Cannot carry on any self-care. Totally confined to bed or chair)  5 - Death   Eustace Pen MM, Creech RH, Tormey DC, et al. [phone]). "Toxicity and response criteria of the [facility]". [place] Oncol. 5 (6): 649-55  LABORATORY DATA:  Lab Results  Component Value Date   WBC 2.7 (L) 01/18/2018   HGB 9.8 (L) 01/18/2018   HCT 29.9 (L) 01/18/2018   MCV 95.8 01/18/2018   PLT 198 01/18/2018   NEUTROABS 1.4 (L) 01/18/2018   Lab Results  Component Value Date   NA 138 01/18/2018   K 4.0 01/18/2018   CL 105 01/18/2018   CO2 27 01/18/2018   GLUCOSE 85 01/18/2018   CREATININE 1.12 (H) 01/18/2018   CALCIUM 9.4 01/18/2018      RADIOGRAPHY: Ct Chest W Contrast  Result Date: 01/21/2018 CLINICAL DATA:  Uterine papillary carcinoma. Staging. Possible lymphoma. Anemia. EXAM: CT CHEST, ABDOMEN, AND PELVIS WITH CONTRAST TECHNIQUE: Multidetector CT imaging of the chest, abdomen and pelvis was performed following the standard protocol during bolus administration of intravenous contrast. CONTRAST:  65mL OMNIPAQUE IOHEXOL 300  MG/ML  SOLN COMPARISON:  Chest radiograph 09/04/2017.  No prior CT. FINDINGS: CT CHEST FINDINGS Cardiovascular: Advanced aortic and branch vessel atherosclerosis. Tortuous thoracic aorta. Moderate cardiomegaly, without pericardial effusion. Multivessel coronary artery atherosclerosis. Pulmonary artery enlargement, outflow tract 3.2 cm. No central pulmonary embolism, on this non-dedicated study. Mediastinum/Nodes: No supraclavicular adenopathy. No axillary adenopathy. Borderline precarinal node at 10 mm on image 27/2. No hilar adenopathy. Tiny hiatal hernia. Lungs/Pleura: No pleural fluid. Scattered right-sided pulmonary nodules, including  at 4 mm in the right upper lobe on image 29/4, 3 mm in the posterior right upper lobe on image 31/4, 3 mm in the right lower lobe on image 48/4. Calcified right lower lobe granuloma. Musculoskeletal: Multilevel spondylosis. CT ABDOMEN PELVIS FINDINGS Hepatobiliary: Medial left liver lobe 9 mm low-density lesion is well-circumscribed with may represent a minimally complex cyst. Borderline intrahepatic biliary duct dilatation is most likely within normal variation for age. The common duct is normal for age at 9 mm. Pancreas: Normal, without mass or ductal dilatation. Spleen: Normal in size, without focal abnormality. Adrenals/Urinary Tract: Normal adrenal glands. Bilateral renal cysts and too small to characterize lesions. No hydronephrosis. Normal urinary bladder. Stomach/Bowel: Proximal gastric underdistention. Extensive colonic diverticulosis. Normal terminal ileum and appendix. Normal small bowel. Vascular/Lymphatic: Advanced aortic and branch vessel atherosclerosis. No abdominopelvic adenopathy. Reproductive: Central uterine hypoattenuation, including at 2.7 cm on sagittal image 93/6. No adnexal mass. Other: No significant free fluid. Mild pelvic floor laxity. No evidence of omental or peritoneal disease. Musculoskeletal: Lumbosacral spondylosis with degenerative disc disease at the lumbosacral junction. IMPRESSION: 1. Abnormal appearance of the uterus, with central hypoattenuation likely related to the clinical history of endometrial cancer. 2. No evidence of metastatic disease or lymphoma within the chest, abdomen, or pelvis. 3. Nonspecific right-sided pulmonary nodules. 4. Coronary artery atherosclerosis. Aortic Atherosclerosis (ICD10-I70.0). 5. Pulmonary artery enlargement suggests pulmonary arterial hypertension. Electronically Signed   By: Abigail Miyamoto M.D.   On: 01/21/2018 11:52   Ct Abdomen Pelvis W Contrast  Result Date: 01/21/2018 CLINICAL DATA:  Uterine papillary carcinoma. Staging. Possible  lymphoma. Anemia. EXAM: CT CHEST, ABDOMEN, AND PELVIS WITH CONTRAST TECHNIQUE: Multidetector CT imaging of the chest, abdomen and pelvis was performed following the standard protocol during bolus administration of intravenous contrast. CONTRAST:  48mL OMNIPAQUE IOHEXOL 300 MG/ML  SOLN COMPARISON:  Chest radiograph 09/04/2017.  No prior CT. FINDINGS: CT CHEST FINDINGS Cardiovascular: Advanced aortic and branch vessel atherosclerosis. Tortuous thoracic aorta. Moderate cardiomegaly, without pericardial effusion. Multivessel coronary artery atherosclerosis. Pulmonary artery enlargement, outflow tract 3.2 cm. No central pulmonary embolism, on this non-dedicated study. Mediastinum/Nodes: No supraclavicular adenopathy. No axillary adenopathy. Borderline precarinal node at 10 mm on image 27/2. No hilar adenopathy. Tiny hiatal hernia. Lungs/Pleura: No pleural fluid. Scattered right-sided pulmonary nodules, including at 4 mm in the right upper lobe on image 29/4, 3 mm in the posterior right upper lobe on image 31/4, 3 mm in the right lower lobe on image 48/4. Calcified right lower lobe granuloma. Musculoskeletal: Multilevel spondylosis. CT ABDOMEN PELVIS FINDINGS Hepatobiliary: Medial left liver lobe 9 mm low-density lesion is well-circumscribed with may represent a minimally complex cyst. Borderline intrahepatic biliary duct dilatation is most likely within normal variation for age. The common duct is normal for age at 9 mm. Pancreas: Normal, without mass or ductal dilatation. Spleen: Normal in size, without focal abnormality. Adrenals/Urinary Tract: Normal adrenal glands. Bilateral renal cysts and too small to characterize lesions. No hydronephrosis. Normal urinary bladder. Stomach/Bowel: Proximal gastric underdistention. Extensive colonic diverticulosis. Normal terminal  ileum and appendix. Normal small bowel. Vascular/Lymphatic: Advanced aortic and branch vessel atherosclerosis. No abdominopelvic adenopathy. Reproductive:  Central uterine hypoattenuation, including at 2.7 cm on sagittal image 93/6. No adnexal mass. Other: No significant free fluid. Mild pelvic floor laxity. No evidence of omental or peritoneal disease. Musculoskeletal: Lumbosacral spondylosis with degenerative disc disease at the lumbosacral junction. IMPRESSION: 1. Abnormal appearance of the uterus, with central hypoattenuation likely related to the clinical history of endometrial cancer. 2. No evidence of metastatic disease or lymphoma within the chest, abdomen, or pelvis. 3. Nonspecific right-sided pulmonary nodules. 4. Coronary artery atherosclerosis. Aortic Atherosclerosis (ICD10-I70.0). 5. Pulmonary artery enlargement suggests pulmonary arterial hypertension. Electronically Signed   By: Abigail Miyamoto M.D.   On: 01/21/2018 11:52      IMPRESSION:  stage III-B high grade serous uterine carcinoma. We discussed options for management including a potentially curative course of chemoradiation followed by full dose chemotherapy. Given the [name]'s performance status and advanced age, I feel this would be very difficult for her to complete. In addition, it would be difficult to cover the vaginal extension with brachytherapy as well as the endometrium/uterus. Placement of Hayman's capsules would also require general anesthesia. We also discussed consideration of external beam and brachytherapy without concurrent chemotherapy, with the [name] and daughter understanding this is not a curative course of treatment. This would help the [name] with her problems of vaginal bleeding. We also discussed a shorter course of palliative radiation therapy extending over 3 weeks primarily to address the [name]'s problems of vaginal bleeding. I would not likely add brachytherapy to this short course of palliative radiation therapy.  We also discussed referral to palliative care. At this point, the [name] and daughter wish to have some type of treatment especially to help with  vaginal bleeding issues.  PLAN:  The [name] and her family will discuss the above options and will let me know later this week their final decision concerning treatment.     ------------------------------------------------  Blair Promise, PhD, MD  This document serves as a record of services personally performed by Gery Pray, MD. It was created on his behalf by Bethann Humble, a trained medical scribe. The creation of this record is based on the scribe's personal observations and the provider's statements to them. This document has been checked and approved by the attending provider.

## 2018-01-31 NOTE — Progress Notes (Signed)
GYN Location of Tumor / Histology: apparent stage IIIB high grade serous uterine carcinoma  Wendy [name] presented with symptoms of: postmenopausal bleeding in February or March 2019  Biopsies revealed:   12/28/17 Diagnosis Cervix, biopsy - PAPILLARY CARCINOMA, HIGH GRADE. SEE COMMENT. Microscopic Comment The lesion has features of a papillary serous carcinoma, possibly of a uterine source. Further evaluation is recommended as clinically indicated.  Past/Anticipated interventions by Gyn/Onc surgery, if any: not a candidate for surgery due to locally advanced disease.  Past/Anticipated interventions by medical oncology, if any: will see [name] 01/26/18.  Weight changes, if any: yes approximately 10 lbs, decreased appetite  Bowel/Bladder complaints, if any: constipation,   Nausea/Vomiting, if any: none  Pain issues, if any:  very seldom in the lower abdomen  SAFETY ISSUES:  Prior radiation?  no  Pacemaker/ICD? no  Possible current pregnancy? no  Is the patient on methotrexate? no  Current Complaints / other details:  [name] is recommending "whole pelvic radiation with intracavitary brachii therapy with the addition of cisplatin on days 1 and 28 (50 mg/m). Typically this would be followed by 4 cycles of carboplatin and paclitaxel."   Vitals:   01/31/18 0847  BP: (!) 172/86  Pulse: 76  Resp: 20  Temp: 98.2 F (36.8 C)  TempSrc: Oral  SpO2: 100%  Weight: 134 lb (60.8 kg)   Wt Readings from Last 3 Encounters:  01/31/18 134 lb (60.8 kg)  01/26/18 134 lb 6.4 oz (61 kg)  01/21/18 135 lb (61.2 kg)

## 2018-02-03 ENCOUNTER — Telehealth: Payer: Self-pay | Admitting: Oncology

## 2018-02-03 ENCOUNTER — Encounter: Payer: Self-pay | Admitting: General Practice

## 2018-02-03 NOTE — Telephone Encounter (Addendum)
Called and left a message for [name]'s daughter regarding questions she has about treatment.  Requested a return call.

## 2018-02-03 NOTE — Progress Notes (Signed)
[facility] Spiritual Care Note  Reached dtr [name] per referral from [facility] re discernment assistance following appts with Drs Denman George and Sondra Come.  [name] was very receptive to [employer], noting that church and faith have always been important to her mother, and verbalized appreciation for conversation partner as she processes what she has heard from providers and discusses with pt how to proceed.  We plan to f/u by phone tomorrow.   [place], [place], [facility] Pager [phone] Voicemail [phone]

## 2018-02-04 NOTE — Telephone Encounter (Signed)
Called patient's daughter, [name], and discussed her questions about treatment.  She said that she just wants what's best for her mom and what is going to stop the cancer the most.  She said not doing any treatment is not an option.  She said she definitely wants to pursue radiation treatment (external beam and brachytherapy) but is not sure about chemotherapy.  She would like to find out from Dr. Sondra Come if he recommends chemotherapy along with the radiation.    Also briefly discussed side effects of radiation including fatigue, diarrhea, urinary issues and how to manage them.  She also said that she may not be able to bring her mother for treatments on Wednesday's.  Advised her to notify the therapists at [facility] appointment as they will schedule her treatments at that appointment.  Also advised that Social Work will be notified to see if they have any options for transportation.  Let her know that Dr. Sondra Come will be notified about her questions about chemotherapy and that we will touch base with her on Monday.

## 2018-02-07 ENCOUNTER — Encounter: Payer: Self-pay | Admitting: Gynecologic Oncology

## 2018-02-07 ENCOUNTER — Telehealth: Payer: Self-pay | Admitting: Oncology

## 2018-02-07 NOTE — Progress Notes (Signed)
Gynecologic Oncology Multi-Disciplinary Disposition Conference Note  Date of the Conference: February 07, 2018  Patient Name: Wendy [name]  Referring Provider: [name] Primary GYN Oncologist: Dr. [name]  Stage/Disposition:  Stage IIIB high grade serous uterine carcinoma.  Disposition is to whole pelvic radiation therapy with possible brachytherapy with cisplatin.  Restaging after radiation therapy for consideration of 4 cycles of carboplatin and taxol.   This Multidisciplinary conference took place involving physicians from [place], [place], Radiation Oncology, Pathology, Radiology along with the Gynecologic Oncology Nurse Practitioner and RN.  Comprehensive assessment of the patient's malignancy, staging, need for surgery, chemotherapy, radiation therapy, and need for further testing were reviewed. Supportive measures, both inpatient and following discharge were also discussed. The recommended plan of care is documented. Greater than 35 minutes were spent correlating and coordinating this patient's care.

## 2018-02-07 NOTE — Telephone Encounter (Addendum)
Called patient's daughter, [name], and scheduled follow up new appointment for tomorrow to see the nurse at 10 am and Dr. Sondra Come at 10:30.

## 2018-02-08 ENCOUNTER — Encounter: Payer: Self-pay | Admitting: Radiation Oncology

## 2018-02-08 ENCOUNTER — Ambulatory Visit
Admission: RE | Admit: 2018-02-08 | Discharge: 2018-02-08 | Disposition: A | Discharge status: Home / Self Care | Payer: Medicare Other | Source: Ambulatory Visit | Attending: Radiation Oncology | Admitting: Radiation Oncology

## 2018-02-08 ENCOUNTER — Other Ambulatory Visit: Payer: Self-pay

## 2018-02-08 ENCOUNTER — Encounter: Payer: Self-pay | Admitting: Oncology

## 2018-02-08 VITALS — BP 170/73 | HR 64 | Temp 98.3°F | Resp 20 | Wt 133.1 lb

## 2018-02-08 DIAGNOSIS — M5137 Other intervertebral disc degeneration, lumbosacral region: Secondary | ICD-10-CM | POA: Diagnosis not present

## 2018-02-08 DIAGNOSIS — I2510 Atherosclerotic heart disease of native coronary artery without angina pectoris: Secondary | ICD-10-CM | POA: Diagnosis not present

## 2018-02-08 DIAGNOSIS — C55 Malignant neoplasm of uterus, part unspecified: Secondary | ICD-10-CM | POA: Insufficient documentation

## 2018-02-08 DIAGNOSIS — Z7982 Long term (current) use of aspirin: Secondary | ICD-10-CM | POA: Insufficient documentation

## 2018-02-08 DIAGNOSIS — Z79899 Other long term (current) drug therapy: Secondary | ICD-10-CM | POA: Diagnosis not present

## 2018-02-08 DIAGNOSIS — N281 Cyst of kidney, acquired: Secondary | ICD-10-CM | POA: Diagnosis not present

## 2018-02-08 DIAGNOSIS — K5790 Diverticulosis of intestine, part unspecified, without perforation or abscess without bleeding: Secondary | ICD-10-CM | POA: Insufficient documentation

## 2018-02-08 DIAGNOSIS — I700 Atherosclerosis of aorta: Secondary | ICD-10-CM | POA: Diagnosis not present

## 2018-02-08 DIAGNOSIS — M47817 Spondylosis without myelopathy or radiculopathy, lumbosacral region: Secondary | ICD-10-CM | POA: Diagnosis not present

## 2018-02-08 DIAGNOSIS — R918 Other nonspecific abnormal finding of lung field: Secondary | ICD-10-CM | POA: Diagnosis not present

## 2018-02-08 NOTE — Progress Notes (Signed)
Wendy [name] is here for her follow-up appointment denies any pain.States that she has mild fatigue.Patient states that she had some vaginal bleeding. States that the blood was red. Patient denies any rectal bleeding.Patient denies any discharge. Patient denies any nausea or vomiting.Patient states that her appetite is not that good. Vitals:   02/08/18 1031  BP: (!) 170/73  Pulse: 64  Resp: 20  Temp: 98.3 F (36.8 C)  TempSrc: Oral  SpO2: 100%  Weight: 133 lb 2 oz (60.4 kg)

## 2018-02-08 NOTE — Progress Notes (Signed)
Radiation Oncology         [phone] ________________________________  Name: Wendy [name] MRN: [id number]  Date: 02/08/2018  DOB: 1917/07/04  Follow-Up Visit Note  CC: Lajean Manes, MD  [name], MD    ICD-10-CM   1. 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 The patient and her family are not comfortable with hospice evaluation or proceeding with palliative radiation treatment.   She would like to proceed with radiation therapy and potentially chemotherapy. I did discuss with the patient and her daughter that her case was discussed at the multidisciplinary gynecological oncology conference earlier this week and that the only potential curative approach would be a combination of radiation therapy, radiosensitizing chemotherapy and potentially full dose chemotherapy at a later date. I discussed the course of treatment side effects and potential toxicities of radiation therapy including external beam radiation therapy and brachytherapy treatments. Patient and her daughter appeared to understand and signed the consent form for radiation treatment. Patient will be evaluated weekly during her radiation treatment and we will make adjustments in planned therapy as she tolerates, given her advanced age of 13.   [name] will make the final decision whether she will be able to tolerate chemotherapy. This evaluation is pending at this time.  The patient is been set up for simulation on April 29 with treatments to begin approximately May 6 or May 7.                     ALLERGIES:  has No Known Allergies.  Meds: Current Outpatient Medications  Medication Sig Dispense Refill  . citalopram (CELEXA) 10 MG tablet Take 5 mg daily after supper by mouth.     . ENSURE (ENSURE) Take 237 mLs by mouth 2 (two) times daily between meals.    . hydrocortisone cream 1  % Apply to affected area 2 times daily 15 g 0  . levothyroxine (SYNTHROID, LEVOTHROID) 75 MCG tablet Take 75 mcg by mouth daily before breakfast.    . losartan (COZAAR) 50 MG tablet Take 50 mg by mouth daily.    Marland Kitchen triamcinolone (KENALOG) 0.025 % cream Apply 1 application 2 (two) times daily as needed topically (rash).    . Vitamin D, Ergocalciferol, (DRISDOL) 50000 units CAPS capsule Take 50,000 Units every 7 (seven) days by mouth.    Marland Kitchen aspirin EC 81 MG tablet Take 81 mg daily after supper by mouth.      No current facility-administered medications for this encounter.     Physical Findings: The patient is in no acute distress. Patient is alert and oriented.  weight is 133 lb 2 oz (60.4 kg). Her oral temperature is 98.3 F (36.8 C). Her blood pressure is 170/73 (abnormal) and her pulse is 64. Her respiration is 20 and oxygen saturation is 100%. .  No significant changes.  Lab Findings: Lab Results  Component Value Date   WBC 2.7 (L) 01/18/2018   HGB 9.8 (L) 01/18/2018   HCT 29.9 (L) 01/18/2018   MCV 95.8 01/18/2018   PLT 198 01/18/2018    Radiographic Findings: Ct Chest W Contrast  Result Date: 01/21/2018 CLINICAL DATA:  Uterine papillary carcinoma. Staging. Possible lymphoma. Anemia. EXAM: CT CHEST, ABDOMEN, AND PELVIS WITH CONTRAST TECHNIQUE: Multidetector CT imaging of the chest, abdomen and pelvis was performed following the standard protocol during bolus administration of  intravenous contrast. CONTRAST:  99mL OMNIPAQUE IOHEXOL 300 MG/ML  SOLN COMPARISON:  Chest radiograph 09/04/2017.  No prior CT. FINDINGS: CT CHEST FINDINGS Cardiovascular: Advanced aortic and branch vessel atherosclerosis. Tortuous thoracic aorta. Moderate cardiomegaly, without pericardial effusion. Multivessel coronary artery atherosclerosis. Pulmonary artery enlargement, outflow tract 3.2 cm. No central pulmonary embolism, on this non-dedicated study. Mediastinum/Nodes: No supraclavicular adenopathy. No axillary  adenopathy. Borderline precarinal node at 10 mm on image 27/2. No hilar adenopathy. Tiny hiatal hernia. Lungs/Pleura: No pleural fluid. Scattered right-sided pulmonary nodules, including at 4 mm in the right upper lobe on image 29/4, 3 mm in the posterior right upper lobe on image 31/4, 3 mm in the right lower lobe on image 48/4. Calcified right lower lobe granuloma. Musculoskeletal: Multilevel spondylosis. CT ABDOMEN PELVIS FINDINGS Hepatobiliary: Medial left liver lobe 9 mm low-density lesion is well-circumscribed with may represent a minimally complex cyst. Borderline intrahepatic biliary duct dilatation is most likely within normal variation for age. The common duct is normal for age at 9 mm. Pancreas: Normal, without mass or ductal dilatation. Spleen: Normal in size, without focal abnormality. Adrenals/Urinary Tract: Normal adrenal glands. Bilateral renal cysts and too small to characterize lesions. No hydronephrosis. Normal urinary bladder. Stomach/Bowel: Proximal gastric underdistention. Extensive colonic diverticulosis. Normal terminal ileum and appendix. Normal small bowel. Vascular/Lymphatic: Advanced aortic and branch vessel atherosclerosis. No abdominopelvic adenopathy. Reproductive: Central uterine hypoattenuation, including at 2.7 cm on sagittal image 93/6. No adnexal mass. Other: No significant free fluid. Mild pelvic floor laxity. No evidence of omental or peritoneal disease. Musculoskeletal: Lumbosacral spondylosis with degenerative disc disease at the lumbosacral junction. IMPRESSION: 1. Abnormal appearance of the uterus, with central hypoattenuation likely related to the clinical history of endometrial cancer. 2. No evidence of metastatic disease or lymphoma within the chest, abdomen, or pelvis. 3. Nonspecific right-sided pulmonary nodules. 4. Coronary artery atherosclerosis. Aortic Atherosclerosis (ICD10-I70.0). 5. Pulmonary artery enlargement suggests pulmonary arterial hypertension.  Electronically Signed   By: Abigail Miyamoto M.D.   On: 01/21/2018 11:52   Ct Abdomen Pelvis W Contrast  Result Date: 01/21/2018 CLINICAL DATA:  Uterine papillary carcinoma. Staging. Possible lymphoma. Anemia. EXAM: CT CHEST, ABDOMEN, AND PELVIS WITH CONTRAST TECHNIQUE: Multidetector CT imaging of the chest, abdomen and pelvis was performed following the standard protocol during bolus administration of intravenous contrast. CONTRAST:  23mL OMNIPAQUE IOHEXOL 300 MG/ML  SOLN COMPARISON:  Chest radiograph 09/04/2017.  No prior CT. FINDINGS: CT CHEST FINDINGS Cardiovascular: Advanced aortic and branch vessel atherosclerosis. Tortuous thoracic aorta. Moderate cardiomegaly, without pericardial effusion. Multivessel coronary artery atherosclerosis. Pulmonary artery enlargement, outflow tract 3.2 cm. No central pulmonary embolism, on this non-dedicated study. Mediastinum/Nodes: No supraclavicular adenopathy. No axillary adenopathy. Borderline precarinal node at 10 mm on image 27/2. No hilar adenopathy. Tiny hiatal hernia. Lungs/Pleura: No pleural fluid. Scattered right-sided pulmonary nodules, including at 4 mm in the right upper lobe on image 29/4, 3 mm in the posterior right upper lobe on image 31/4, 3 mm in the right lower lobe on image 48/4. Calcified right lower lobe granuloma. Musculoskeletal: Multilevel spondylosis. CT ABDOMEN PELVIS FINDINGS Hepatobiliary: Medial left liver lobe 9 mm low-density lesion is well-circumscribed with may represent a minimally complex cyst. Borderline intrahepatic biliary duct dilatation is most likely within normal variation for age. The common duct is normal for age at 9 mm. Pancreas: Normal, without mass or ductal dilatation. Spleen: Normal in size, without focal abnormality. Adrenals/Urinary Tract: Normal adrenal glands. Bilateral renal cysts and too small to characterize lesions. No hydronephrosis. Normal urinary bladder. Stomach/Bowel:  Proximal gastric underdistention. Extensive  colonic diverticulosis. Normal terminal ileum and appendix. Normal small bowel. Vascular/Lymphatic: Advanced aortic and branch vessel atherosclerosis. No abdominopelvic adenopathy. Reproductive: Central uterine hypoattenuation, including at 2.7 cm on sagittal image 93/6. No adnexal mass. Other: No significant free fluid. Mild pelvic floor laxity. No evidence of omental or peritoneal disease. Musculoskeletal: Lumbosacral spondylosis with degenerative disc disease at the lumbosacral junction. IMPRESSION: 1. Abnormal appearance of the uterus, with central hypoattenuation likely related to the clinical history of endometrial cancer. 2. No evidence of metastatic disease or lymphoma within the chest, abdomen, or pelvis. 3. Nonspecific right-sided pulmonary nodules. 4. Coronary artery atherosclerosis. Aortic Atherosclerosis (ICD10-I70.0). 5. Pulmonary artery enlargement suggests pulmonary arterial hypertension. Electronically Signed   By: Abigail Miyamoto M.D.   On: 01/21/2018 11:52      Plan:  CT simulation on April 29 at 11 AM.   ____________________________________ Gery Pray, MD

## 2018-02-10 ENCOUNTER — Ambulatory Visit: Payer: Medicare Other | Admitting: Podiatry

## 2018-02-10 ENCOUNTER — Encounter: Payer: Self-pay | Admitting: Podiatry

## 2018-02-10 ENCOUNTER — Encounter: Payer: Self-pay | Admitting: *Deleted

## 2018-02-10 DIAGNOSIS — B351 Tinea unguium: Secondary | ICD-10-CM

## 2018-02-10 DIAGNOSIS — M79676 Pain in unspecified toe(s): Secondary | ICD-10-CM | POA: Diagnosis not present

## 2018-02-10 NOTE — Progress Notes (Signed)
[place] Work  Clinical Social Work was referred by Elmo Putt, RN, for transportation concerns.  Clinical Social Worker contacted patient's daughter, [name], to offer support and assess for needs.  Patient's daughter states she will be bringing her mother to appointments but was looking for a ride on Wednesdays when she has class.  [place]   CSW provided several transportation options to explore: -[place] transportation (may go out of county specific days of the week) -[place] [employer]- may offer free rides as an insurance benefit  Patient's daughter was appreciative of call and plans to follow up with CSW as needed.   Maryjean Morn, MSW, LCSW, OSW-C Clinical Social Worker [facility] [phone]

## 2018-02-10 NOTE — Progress Notes (Signed)
Complaint:  Visit Type: Patient returns to my office for continued preventative foot care services. Complaint: Patient states" my nails have grown long and thick and become painful to walk and wear shoes"  The patient presents for preventative foot care services. No changes to ROS  Podiatric Exam: Vascular: dorsalis pedis and posterior tibial pulses are palpable bilateral. Capillary return is immediate. Temperature gradient is WNL. Skin turgor WNL  Sensorium: Normal Semmes Weinstein monofilament test. Normal tactile sensation bilaterally. Nail Exam: Pt has thick disfigured discolored nails with subungual debris noted bilateral entire nail hallux through fifth toenails Ulcer Exam: There is no evidence of ulcer or pre-ulcerative changes or infection. Orthopedic Exam: Muscle tone and strength are WNL. No limitations in general ROM. No crepitus or effusions noted. Foot type and digits show no abnormalities.  HAV  B/L with hammer toes second  B/L Skin: No Porokeratosis. No infection or ulcers  Diagnosis:  Onychomycosis, , Pain in right toe, pain in left toes  Treatment & Plan Procedures and Treatment: Consent by patient was obtained for treatment procedures. The patient understood the discussion of treatment and procedures well. All questions were answered thoroughly reviewed. Debridement of mycotic and hypertrophic toenails, 1 through 5 bilateral and clearing of subungual debris. No ulceration, no infection noted.  Return Visit-Office Procedure: Patient instructed to return to the office for a follow up visit 3 months for continued evaluation and treatment.    Gardiner Barefoot DPM

## 2018-02-14 ENCOUNTER — Telehealth: Payer: Self-pay | Admitting: Oncology

## 2018-02-14 ENCOUNTER — Ambulatory Visit
Admission: RE | Admit: 2018-02-14 | Discharge: 2018-02-14 | Disposition: A | Discharge status: Home / Self Care | Payer: Medicare Other | Source: Ambulatory Visit | Attending: Radiation Oncology | Admitting: Radiation Oncology

## 2018-02-14 DIAGNOSIS — Z510 Encounter for antineoplastic radiation therapy: Secondary | ICD-10-CM | POA: Diagnosis not present

## 2018-02-14 DIAGNOSIS — C55 Malignant neoplasm of uterus, part unspecified: Secondary | ICD-10-CM | POA: Diagnosis present

## 2018-02-14 NOTE — Telephone Encounter (Signed)
Called Dr. Grier Mitts nurse, Aldona Bar, RN regarding possible chemotherapy with radiation.  Asked if [name] would like to see patient sooner since she with be starting radiation on 02/21/18.  She said she will check with [name].

## 2018-02-14 NOTE — Progress Notes (Signed)
  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  Identity was confirmed.  All relevant records and images related to the planned course of therapy were reviewed.  The patient freely provided informed written consent to proceed with treatment after reviewing the details related to the planned course of therapy. The consent form was witnessed and verified by the simulation staff.  Then, the patient was set-up in a stable reproducible  supine position for radiation therapy.  CT images were obtained.  Surface markings were placed.  The CT images were loaded into the planning software.  Then the target and avoidance structures were contoured.  Treatment planning then occurred.  The radiation prescription was entered and confirmed.  Then, I designed and supervised the construction of a total of 5 medically necessary complex treatment devices.  I have requested : 3D Simulation  I have requested a DVH of the following structures: uterus/cervix, bladder, rectum and bowel.  I have ordered:dose calc.  PLAN:  The patient will receive 45 Gy in 25 fractions directed at the pelvis region. Depending on her tolerance to the treatment she will be evaluated for brachytherapy treatments.  -----------------------------------  Blair Promise, PhD, MD  This document serves as a record of services personally performed by Blair Promise, PhD, MD. It was created on his behalf by Margit Banda, a trained medical scribe. The creation of this record is based on the scribe's personal observations and the provider's statements to them. This document has been checked and approved by the attending provider.

## 2018-02-16 DIAGNOSIS — Z510 Encounter for antineoplastic radiation therapy: Secondary | ICD-10-CM | POA: Diagnosis not present

## 2018-02-16 DIAGNOSIS — C55 Malignant neoplasm of uterus, part unspecified: Secondary | ICD-10-CM | POA: Diagnosis present

## 2018-02-18 ENCOUNTER — Telehealth: Payer: Self-pay

## 2018-02-18 ENCOUNTER — Other Ambulatory Visit: Payer: Self-pay

## 2018-02-18 DIAGNOSIS — C55 Malignant neoplasm of uterus, part unspecified: Secondary | ICD-10-CM

## 2018-02-18 NOTE — Telephone Encounter (Signed)
Silvano Bilis, [place] Coordinator to process referral for nursing and physical therapy services in order to promote strength training and provide assessment of needs during palliative radiation therapy. Provided pt name and DOB, diagnosis, and referral information. Call back number provided [phone] with any questions.

## 2018-02-18 NOTE — Progress Notes (Signed)
HEMATOLOGY ONCOLOGY CLINIC NOTE  Date of Service: 02/21/18  Patient Care Team: Lajean Manes, MD as PCP - General (Internal Medicine)  CHIEF COMPLAINTS/PURPOSE OF CONSULTATION:  Anemia F/u for recently diagnosed uterine papillary carcinoma.  HISTORY OF PRESENTING ILLNESS:   Wendy [name] 84 y.o. female is here because of worsening anemia.  Patient has a history of hypertension and hypothyroidism who as per her daughter has been a very active person until recently. Her daughter notes that patient's levothyroxine dose was reduced from 75 mcg to 50 mcg daily and this produced a significant change in the patient's cognition and energy levels.  She reports that it is very unusual for the patient to wake up late and then sleep throughout the day as she is currently doing. Per the patient's daughter she was a very active person, who would do a lot of walking, including walking around the track.   She notes that the patient has been slowly declining over the past year ever since seeing a new provider who started decreasing her thyroid medication. The patient's appetite has been declining as well.    She was seen in the ED for two syncopal episodes. On 09/04/2018 .  She was noted to be hypotensive and hypoxic.  Symptoms were thought to be related to dehydration due to poor oral intake.  Echo showed normal ejection fraction of 60-65% with grade 2 diastolic dysfunction.  Her losartan dose was significantly reduced on discharge.  She was noted to have several episodes of nonsustained VT but her heart rate was in the 50s.  Hemoglobin was noted to be between 7 and 7.5 but transfusion was withheld due to presence of positive Coombs test and outpatient hematology consult was recommended. FOBT was never sent.  She was found to have abnormal CBC from 09/07/2017 -with hemoglobin of 7 MCV of 95, WBC count of 3k with an ANC of 2.3k.  And a platelet count of 175k.  Reticulocyte percentage of 1.9, absolute  reticulocyte count of 45.8k/ul .  She was noted to have a positive Coombs test but her LDH and haptoglobin levels were within normal limits suggesting against overt hemolysis.  ANA negative. Sedimentation rate 138.  CRP within normal limits. Free T4 0.87 Ferritin 857 with 23% iron saturation. Folate 19.5 B12 - 470  She denies recent chest pain on exertion, shortness of breath on minimal exertion, or palpitations. She denies cough. She had not noticed any recent bleeding such as epistaxis, hematuria or hematochezia.   The patient denies over the counter NSAID ingestion. She is not on antiplatelets agents.   She had no prior history or diagnosis of cancer. Her age appropriate screening programs are up-to-date.  She denies any pica and eats a variety of diet.  She never donated blood or received blood transfusion.   Some days her appetite is better than others. The patients daughter states she has also become more forgetful at times.  Pt denies fever, chills, trouble swelling, abdominal pain, or any other symptoms or complaints at this time.  Patient really knows that she still feels somewhat fatigued but has no overt lightheadedness or dizziness at this time and feels better than when she was admitted. No overt infectious issues. No overt evidence of bleeding.  Some unquantified weight loss due to decreased appetite. No other overt focal symptoms.  Interval History:   JACQUELINNE [name] is here regarding her borderline macrocytic anemia  and leucopenia. And her newly diagnosed uterine high grade papillary caricnoma. The patient's  last visit with [place] was on 01/26/18. She is accompanied today by her daughter. The pt reports that she is doing well overall and is looking to spending her 38th birthday with her older sister next week.   The pt reports that she received her first treatment of radiation today and will be completing sessions in June.   Lab results today (02/21/18) of CBC, CMP, and  Reticulocytes is as follows: all values are WNL except for WBC at 2.7k, RBC at 3.14, Hgb at 9.8, HCT at 30.0, RDW at 15.7, Lymphs Abs at 0.8k, Creatinine at 1.13. LDH 02/21/18 is slightly elevated at 257.  On review of systems, pt reports good energy levels and denies vaginal bleeding, leg swelling, and any other symptoms.    MEDICAL HISTORY:  Past Medical History:  Diagnosis Date  . Hypertension   . Thyroid disease   Nonsustained VT Hypothyroidism Acute kidney injury 08/2017 Depression-on Celexa.  SURGICAL HISTORY: Past Surgical History:  Procedure Laterality Date  . CORONARY ANGIOPLASTY      SOCIAL HISTORY: Social History   Socioeconomic History  . Marital status: Widowed    Spouse name: Not on file  . Number of children: Not on file  . Years of education: Not on file  . Highest education level: Not on file  Occupational History  . Not on file  Social Needs  . Financial resource strain: Not on file  . Food insecurity:    Worry: Not on file    Inability: Not on file  . Transportation needs:    Medical: Not on file    Non-medical: Not on file  Tobacco Use  . Smoking status: Never Smoker  . Smokeless tobacco: Never Used  Substance and Sexual Activity  . Alcohol use: No  . Drug use: No  . Sexual activity: Never  Lifestyle  . Physical activity:    Days per week: Not on file    Minutes per session: Not on file  . Stress: Not on file  Relationships  . Social connections:    Talks on phone: Not on file    Gets together: Not on file    Attends religious service: Not on file    Active member of club or organization: Not on file    Attends meetings of clubs or organizations: Not on file    Relationship status: Not on file  . Intimate partner violence:    Fear of current or ex partner: Not on file    Emotionally abused: Not on file    Physically abused: Not on file    Forced sexual activity: Not on file  Other Topics Concern  . Not on file  Social History  Narrative  . Not on file    FAMILY HISTORY: No family history on file.  ALLERGIES:  has No Known Allergies.  MEDICATIONS:  Current Outpatient Medications  Medication Sig Dispense Refill  . aspirin EC 81 MG tablet Take 81 mg daily after supper by mouth.     . citalopram (CELEXA) 10 MG tablet Take 5 mg daily after supper by mouth.     . ENSURE (ENSURE) Take 237 mLs by mouth 2 (two) times daily between meals.    . hydrocortisone cream 1 % Apply to affected area 2 times daily 15 g 0  . levothyroxine (SYNTHROID, LEVOTHROID) 75 MCG tablet Take 75 mcg by mouth daily before breakfast.    . losartan (COZAAR) 50 MG tablet Take 50 mg by mouth daily.    Marland Kitchen  triamcinolone (KENALOG) 0.025 % cream Apply 1 application 2 (two) times daily as needed topically (rash).    . Vitamin D, Ergocalciferol, (DRISDOL) 50000 units CAPS capsule Take 50,000 Units every 7 (seven) days by mouth.     No current facility-administered medications for this visit.     REVIEW OF SYSTEMS:   A 10+ POINT REVIEW OF SYSTEMS WAS OBTAINED including neurology, dermatology, psychiatry, cardiac, respiratory, lymph, extremities, GI, GU, Musculoskeletal, constitutional, breasts, reproductive, HEENT.  All pertinent positives are noted in the HPI.  All others are negative.    PHYSICAL EXAMINATION: ECOG PERFORMANCE STATUS: 2 - Symptomatic, <50% confined to bed  Vitals:   02/21/18 1343  BP: (!) 178/93  Pulse: 77  Resp: 18  Temp: 98.1 F (36.7 C)  SpO2: 100%   Filed Weights   02/21/18 1343  Weight: 134 lb 9.6 oz (61.1 kg)    . GENERAL:alert, in no acute distress and comfortable SKIN: no acute rashes, no significant lesions EYES: conjunctiva are pink and non-injected, sclera anicteric OROPHARYNX: MMM, no exudates, no oropharyngeal erythema or ulceration NECK: supple, no JVD LYMPH:  no palpable lymphadenopathy in the cervical, axillary or inguinal regions LUNGS: clear to auscultation b/l with normal respiratory  effort HEART: regular rate & rhythm ABDOMEN:  normoactive bowel sounds , non tender, not distended. Extremity: no pedal edema PSYCH: alert & oriented x 3 with fluent speech NEURO: no focal motor/sensory deficits  LABORATORY DATA:  I have reviewed the data as listed  . CBC Latest Ref Rng & Units 02/21/2018 01/18/2018 12/17/2017  WBC 3.9 - 10.3 K/uL 2.7(L) 2.7(L) 2.7(L)  Hemoglobin 11.6 - 15.9 g/dL 9.8(L) 9.8(L) 9.5(L)  Hematocrit 34.8 - 46.6 % 30.0(L) 29.9(L) 29.5(L)  Platelets 145 - 400 K/uL 192 198 217   . CMP Latest Ref Rng & Units 02/21/2018 01/18/2018 12/17/2017  Glucose 70 - 140 mg/dL 86 85 89  BUN 7 - 26 mg/dL 16 17 18   Creatinine 0.60 - 1.10 mg/dL 1.13(H) 1.12(H) 1.10(H)  Sodium 136 - 145 mmol/L 138 138 138  Potassium 3.5 - 5.1 mmol/L 4.2 4.0 4.2  Chloride 98 - 109 mmol/L 106 105 108  CO2 22 - 29 mmol/L 27 27 21(L)  Calcium 8.4 - 10.4 mg/dL 9.6 9.4 8.9  Total Protein 6.4 - 8.3 g/dL 8.3 8.2 7.8  Total Bilirubin 0.2 - 1.2 mg/dL 0.6 0.6 0.8  Alkaline Phos 40 - 150 U/L 64 62 54  AST 5 - 34 U/L 23 23 25   ALT 0 - 55 U/L 8 12 11(L)        RADIOGRAPHIC STUDIES: I have personally reviewed the radiological images as listed and agreed with the findings in the report. No results found.  ASSESSMENT & PLAN:   84 yo female   1) Normocytic Anemia likely multifactorial  Likely MDS + blood loss from uterine high grade papillary carcinoma + Anemia of chronic disease related to newly diagnosed malignancy.  Patient's hemoglobin is now improved from 7.5 to 9.5 to 9.8 Slightly elevated LDH level at 297 but normal haptoglobin suggesting against acute hemolysis.   Homocystine is elevated though B12 level is within normal limits and RBC folate- WNL  10/28/17 MM panel showed no M-Protein, with elevated IgG and IgM. Kappa and lambda light chain were both elevated- likely reactive to newly diagnosed malignancy. No overt evidence of GI bleeding.  No evidence of iron deficiency. Cannot rule out MDS  given the patient's advanced age.  2) leukopenia with mild neutropenia 1.3k  3) Elevated  TSH with normal FT4 -TSH is Elevated at 8.99 (normal FT4) in the setting of recent decrease in levothyroxine dose from 53mcg to 26mcg per PCP. Now back at 83mcg po daily.  4) Elevated LDH levels -no overt evidence of hemolysis given normal haptoglobin level and no reticulocytosis and normal bilirubin levels.  Likely from uterine papillary carcinoma 5) Positive direct Coombs test - no evidence of overt hemolysis at this time.  6) Newly diagnosed high grade papillary uterine carcinoma . CT with no evidence of metastatic disease PLAN  --Discussed the findings of Papillary serous carcinoma again with pt and her daughter in light of the recent CT scan.  -appreciate Dr Serita Grit input -- not a surgical candidate. -Discussed that we would not recommend chemotherapy as the risk of complications and acute toxicity is quite large given her age, and limited bone marrow reserve and would likely decrease the quality of her life. -she has just started pallaitive radiation -referred for home health services which will likely transition to palliative of hopsice depending on goals of care and disease progression -No indication for PRBC transfusion at this time -continue B complex.  -Daughter is very involved and will try to optimize the patient's oral intake. -Discussed pt labwork today, 02/21/18; blood counts and chemistries are stable.  -Continue with palliative radiation  -Will see pt back in one month  5) . Patient Active Problem List   Diagnosis Date Noted  . Uterine carcinoma ([place]) 01/31/2018  . Syncope 09/28/2013  . Altered mental status 09/26/2013  . Syncope and collapse 09/26/2013  . Hypothyroidism 09/26/2013  . Depression 09/26/2013  . HTN (hypertension) 09/26/2013  Plan -continue f/u with PCP    RTC with Dr kale in 4 weeks with labs  . The total time spent in the appointment was 20 minutes and  more than 50% was on counseling and direct patient cares.   Sullivan Lone MD [place] AAHIVMS [facility] Hematology/Oncology Physician [facility]  (Office):       [phone] (Work cell):  [phone] (Fax):           [phone]    This document serves as a record of services personally performed by Sullivan Lone, MD. It was created on his behalf by Baldwin Jamaica, a trained medical scribe. The creation of this record is based on the scribe's personal observations and the provider's statements to them.   .I have reviewed the above documentation for accuracy and completeness, and I agree with the above. Brunetta Genera MD MS

## 2018-02-21 ENCOUNTER — Telehealth: Payer: Self-pay | Admitting: Hematology

## 2018-02-21 ENCOUNTER — Encounter: Payer: Self-pay | Admitting: Hematology

## 2018-02-21 ENCOUNTER — Inpatient Hospital Stay: Payer: Medicare Other | Attending: Hematology

## 2018-02-21 ENCOUNTER — Inpatient Hospital Stay (HOSPITAL_BASED_OUTPATIENT_CLINIC_OR_DEPARTMENT_OTHER): Payer: Medicare Other | Admitting: Hematology

## 2018-02-21 ENCOUNTER — Ambulatory Visit
Admission: RE | Admit: 2018-02-21 | Discharge: 2018-02-21 | Disposition: A | Discharge status: Home / Self Care | Payer: Medicare Other | Source: Ambulatory Visit | Attending: Radiation Oncology | Admitting: Radiation Oncology

## 2018-02-21 VITALS — BP 178/93 | HR 77 | Temp 98.1°F | Resp 18 | Wt 134.6 lb

## 2018-02-21 DIAGNOSIS — D72819 Decreased white blood cell count, unspecified: Secondary | ICD-10-CM | POA: Diagnosis not present

## 2018-02-21 DIAGNOSIS — R7402 Elevation of levels of lactic acid dehydrogenase (LDH): Secondary | ICD-10-CM

## 2018-02-21 DIAGNOSIS — C55 Malignant neoplasm of uterus, part unspecified: Secondary | ICD-10-CM | POA: Diagnosis not present

## 2018-02-21 DIAGNOSIS — D649 Anemia, unspecified: Secondary | ICD-10-CM | POA: Insufficient documentation

## 2018-02-21 DIAGNOSIS — R740 Nonspecific elevation of levels of transaminase and lactic acid dehydrogenase [LDH]: Secondary | ICD-10-CM

## 2018-02-21 LAB — CBC WITH DIFFERENTIAL (CANCER CENTER ONLY)
BASOS ABS: 0 10*3/uL (ref 0.0–0.1)
Basophils Relative: 0 %
EOS PCT: 1 %
Eosinophils Absolute: 0 10*3/uL (ref 0.0–0.5)
HCT: 30 % — ABNORMAL LOW (ref 34.8–46.6)
Hemoglobin: 9.8 g/dL — ABNORMAL LOW (ref 11.6–15.9)
LYMPHS PCT: 27 %
Lymphs Abs: 0.8 10*3/uL — ABNORMAL LOW (ref 0.9–3.3)
MCH: 31.2 pg (ref 25.1–34.0)
MCHC: 32.7 g/dL (ref 31.5–36.0)
MCV: 95.5 fL (ref 79.5–101.0)
MONO ABS: 0.3 10*3/uL (ref 0.1–0.9)
Monocytes Relative: 11 %
Neutro Abs: 1.6 10*3/uL (ref 1.5–6.5)
Neutrophils Relative %: 61 %
PLATELETS: 192 10*3/uL (ref 145–400)
RBC: 3.14 MIL/uL — ABNORMAL LOW (ref 3.70–5.45)
RDW: 15.7 % — AB (ref 11.2–14.5)
WBC Count: 2.7 10*3/uL — ABNORMAL LOW (ref 3.9–10.3)

## 2018-02-21 LAB — CMP (CANCER CENTER ONLY)
ALT: 8 U/L (ref 0–55)
ANION GAP: 5 (ref 3–11)
AST: 23 U/L (ref 5–34)
Albumin: 3.8 g/dL (ref 3.5–5.0)
Alkaline Phosphatase: 64 U/L (ref 40–150)
BILIRUBIN TOTAL: 0.6 mg/dL (ref 0.2–1.2)
BUN: 16 mg/dL (ref 7–26)
CO2: 27 mmol/L (ref 22–29)
Calcium: 9.6 mg/dL (ref 8.4–10.4)
Chloride: 106 mmol/L (ref 98–109)
Creatinine: 1.13 mg/dL — ABNORMAL HIGH (ref 0.60–1.10)
GFR, Est AFR Am: 45 mL/min — ABNORMAL LOW (ref >60)
GFR, Estimated: 39 mL/min — ABNORMAL LOW (ref >60)
Glucose, Bld: 86 mg/dL (ref 70–140)
POTASSIUM: 4.2 mmol/L (ref 3.5–5.1)
Sodium: 138 mmol/L (ref 136–145)
TOTAL PROTEIN: 8.3 g/dL (ref 6.4–8.3)

## 2018-02-21 LAB — LACTATE DEHYDROGENASE: LDH: 257 U/L — ABNORMAL HIGH (ref 125–245)

## 2018-02-21 LAB — RETICULOCYTES
RBC.: 3.14 MIL/uL — ABNORMAL LOW (ref 3.70–5.45)
RETIC COUNT ABSOLUTE: 65.9 10*3/uL (ref 33.7–90.7)
RETIC CT PCT: 2.1 % (ref 0.7–2.1)

## 2018-02-21 NOTE — Patient Instructions (Signed)
Thank you for choosing [facility] to provide your oncology and hematology care.  To afford each patient quality time with our providers, please arrive 30 minutes before your scheduled appointment time.  If you arrive late for your appointment, you may be asked to reschedule.  We strive to give you quality time with our providers, and arriving late affects you and other patients whose appointments are after yours.   If you are a no show for multiple scheduled visits, you may be dismissed from the clinic at the providers discretion.    Again, thank you for choosing [facility], our hope is that these requests will decrease the amount of time that you wait before being seen by our physicians.  ______________________________________________________________________  Should you have questions after your visit to the [facility], please contact our office at [phone] between the hours of 8:30 and 4:30 p.m.    Voicemails left after 4:30p.m will not be returned until the following business day.    For prescription refill requests, please have your pharmacy contact us directly.  Please also try to allow 48 hours for prescription requests.    Please contact the scheduling department for questions regarding scheduling.  For scheduling of procedures such as PET scans, CT scans, MRI, Ultrasound, etc please contact central scheduling at [phone].    Resources For Cancer Patients and Caregivers:   Oncolink.org:  A wonderful resource for patients and healthcare providers for information regarding your disease, ways to tract your treatment, what to expect, etc.     American Cancer Society:  800-227-2345  Can help patients locate various types of support and financial assistance  Cancer Care: 1-800-813-HOPE (4673) Provides financial assistance, online support groups, medication/co-pay assistance.    [place] DSS:  336-641-3447 Where to apply for food  stamps, Medicaid, and utility assistance  Medicare Rights Center: 800-333-4114 Helps people with Medicare understand their rights and benefits, navigate the Medicare system, and secure the quality healthcare they deserve  SCAT: 336-333-6589 [place] [employer]'s shared-ride transportation service for eligible riders who have a disability that prevents them from riding the fixed route bus.    For additional information on assistance programs please contact our social worker:   Grier Hock/Abigail Elmore:  [phone]            

## 2018-02-21 NOTE — Progress Notes (Signed)
  Radiation Oncology         ([phone]) [phone] ________________________________  Name: Wendy [name] MRN: [id number]  Date: 02/21/2018  DOB: 1917-08-16  Simulation Verification Note    ICD-10-CM   1. Uterine carcinoma ([place]) C55     Status: outpatient  NARRATIVE: The patient was brought to the treatment unit and placed in the planned treatment position. The clinical setup was verified. Then port films were obtained and uploaded to the radiation oncology medical record software.  The treatment beams were carefully compared against the planned radiation fields. The position location and shape of the radiation fields was reviewed. They targeted volume of tissue appears to be appropriately covered by the radiation beams. Organs at risk appear to be excluded as planned.  Based on my personal review, I approved the simulation verification. The patient's treatment will proceed as planned.  -----------------------------------  Blair Promise, PhD, MD  This document serves as a record of services personally performed by Gery Pray, MD. It was created on his behalf by Rae Lips, a trained medical scribe. The creation of this record is based on the scribe's personal observations and the provider's statements to them. This document has been checked and approved by the attending provider.

## 2018-02-21 NOTE — Telephone Encounter (Signed)
Scheduled appt per 5/6 los - Gave patient aVS and calender per los.

## 2018-02-22 ENCOUNTER — Ambulatory Visit
Admission: RE | Admit: 2018-02-22 | Discharge: 2018-02-22 | Disposition: A | Discharge status: Home / Self Care | Payer: Medicare Other | Source: Ambulatory Visit | Attending: Radiation Oncology | Admitting: Radiation Oncology

## 2018-02-22 ENCOUNTER — Telehealth: Payer: Self-pay | Admitting: Medical Oncology

## 2018-02-22 DIAGNOSIS — C55 Malignant neoplasm of uterus, part unspecified: Secondary | ICD-10-CM | POA: Diagnosis not present

## 2018-02-22 NOTE — Telephone Encounter (Signed)
[facility] requests nurse visits as scheduled. 2x week for 2 weeks. 1 x week for 2 weeks and 2 visits prn. Order given okay per [name].

## 2018-02-23 ENCOUNTER — Ambulatory Visit
Admission: RE | Admit: 2018-02-23 | Discharge: 2018-02-23 | Disposition: A | Discharge status: Home / Self Care | Payer: Medicare Other | Source: Ambulatory Visit | Attending: Radiation Oncology | Admitting: Radiation Oncology

## 2018-02-23 DIAGNOSIS — C55 Malignant neoplasm of uterus, part unspecified: Secondary | ICD-10-CM | POA: Diagnosis not present

## 2018-02-24 ENCOUNTER — Ambulatory Visit
Admission: RE | Admit: 2018-02-24 | Discharge: 2018-02-24 | Disposition: A | Discharge status: Home / Self Care | Payer: Medicare Other | Source: Ambulatory Visit | Attending: Radiation Oncology | Admitting: Radiation Oncology

## 2018-02-24 ENCOUNTER — Telehealth: Payer: Self-pay

## 2018-02-24 ENCOUNTER — Ambulatory Visit: Payer: Medicare Other | Admitting: Neurology

## 2018-02-24 DIAGNOSIS — C55 Malignant neoplasm of uterus, part unspecified: Secondary | ICD-10-CM | POA: Diagnosis not present

## 2018-02-24 NOTE — Telephone Encounter (Signed)
Received message that pt's daughter was concerned that two different home health agencies were coming to her mother's home and that they were getting charged for both. Called Karen-RN with [facility] to clarify. Santiago Glad stated that their agency always check beforehand to make sure another company is not already involved, and she couldn't see another agency listed in the pt's chart. She stated that the pt was receiving RN and PT visits, and that that might be where the confusion came from. She also clarified that [facility] was billing Medicare for the services so the pt would not have a co-pay or any other charges out of pocket.   Called both the home and mobile numbers listed in the chart and left a VM on the mobile number detailing the information received from [facility]. Instructed the pt/daughter to call back though should they had any other questions/concerns.

## 2018-02-25 ENCOUNTER — Ambulatory Visit
Admission: RE | Admit: 2018-02-25 | Discharge: 2018-02-25 | Disposition: A | Discharge status: Home / Self Care | Payer: Medicare Other | Source: Ambulatory Visit | Attending: Radiation Oncology | Admitting: Radiation Oncology

## 2018-02-25 DIAGNOSIS — C55 Malignant neoplasm of uterus, part unspecified: Secondary | ICD-10-CM | POA: Diagnosis not present

## 2018-02-25 NOTE — Telephone Encounter (Addendum)
Call from pt's daughter reporting [facility] is coming out and [facility] as well. Daughter is asking that [name] decide which agency continues to come out. They both seem satisfactory to the patient. Daughter is very worried about insurance issues. Message to nurse to clarify.

## 2018-02-28 ENCOUNTER — Ambulatory Visit
Admission: RE | Admit: 2018-02-28 | Discharge: 2018-02-28 | Disposition: A | Discharge status: Home / Self Care | Payer: Medicare Other | Source: Ambulatory Visit | Attending: Radiation Oncology | Admitting: Radiation Oncology

## 2018-02-28 DIAGNOSIS — C55 Malignant neoplasm of uterus, part unspecified: Secondary | ICD-10-CM | POA: Diagnosis not present

## 2018-03-01 ENCOUNTER — Telehealth: Payer: Self-pay

## 2018-03-01 ENCOUNTER — Ambulatory Visit
Admission: RE | Admit: 2018-03-01 | Discharge: 2018-03-01 | Disposition: A | Discharge status: Home / Self Care | Payer: Medicare Other | Source: Ambulatory Visit | Attending: Radiation Oncology | Admitting: Radiation Oncology

## 2018-03-01 DIAGNOSIS — C55 Malignant neoplasm of uterus, part unspecified: Secondary | ICD-10-CM | POA: Diagnosis not present

## 2018-03-01 NOTE — Telephone Encounter (Signed)
Received call from Egbert Garibaldi, RN that pt was receiving home care from two facilities: [facility] and [facility]. Katelin to call [facility] and cancel home care services. Pt and daughter had no preference between [facility] and [facility] regarding care.

## 2018-03-01 NOTE — Telephone Encounter (Signed)
Received call back from [employer] optician for [facility]) saying that pt would not be responsible for any payments for the few visits they provided.   Called tried both cell and home numbers listed and left VM on home number informing pt and daughter of the update, and to call should they have any other questions/concerns.

## 2018-03-01 NOTE — Progress Notes (Signed)
[place] to cancel pt's [facility] orders with their agency (will only be using [facility] moving forward). Was told they needed a "cancel" order faxed to stop their services. Unable to place a "cancel" order in the system so faxed over the referral request with "cancel" written on it. Also left a voicemail with their insurance coordinator Severiano Gilbert) to see if the pt will be responsible for any cost out of pocket since it was not her fault both agencies were ordered. Also left a voicemail with Karen--RN with [facility] to let her know of the situation and to see if there was anything else that needed to be done or that she could advise on the situation. Advised pt and her daughter that I would call them back once I heard back from the insurance coordinator and [facility].

## 2018-03-02 ENCOUNTER — Ambulatory Visit
Admission: RE | Admit: 2018-03-02 | Discharge: 2018-03-02 | Disposition: A | Discharge status: Home / Self Care | Payer: Medicare Other | Source: Ambulatory Visit | Attending: Radiation Oncology | Admitting: Radiation Oncology

## 2018-03-02 ENCOUNTER — Telehealth: Payer: Self-pay

## 2018-03-02 DIAGNOSIS — C55 Malignant neoplasm of uterus, part unspecified: Secondary | ICD-10-CM | POA: Diagnosis not present

## 2018-03-02 NOTE — Telephone Encounter (Signed)
Received fax from [facility] for orders to be signed. Orders signed and faxed back to [facility] at [phone]. Confirmed fax receipt 03/02/18 at 1116.

## 2018-03-03 ENCOUNTER — Ambulatory Visit
Admission: RE | Admit: 2018-03-03 | Discharge: 2018-03-03 | Disposition: A | Discharge status: Home / Self Care | Payer: Medicare Other | Source: Ambulatory Visit | Attending: Radiation Oncology | Admitting: Radiation Oncology

## 2018-03-03 DIAGNOSIS — C55 Malignant neoplasm of uterus, part unspecified: Secondary | ICD-10-CM | POA: Diagnosis not present

## 2018-03-04 ENCOUNTER — Ambulatory Visit
Admission: RE | Admit: 2018-03-04 | Discharge: 2018-03-04 | Disposition: A | Discharge status: Home / Self Care | Payer: Medicare Other | Source: Ambulatory Visit | Attending: Radiation Oncology | Admitting: Radiation Oncology

## 2018-03-04 DIAGNOSIS — C55 Malignant neoplasm of uterus, part unspecified: Secondary | ICD-10-CM | POA: Diagnosis not present

## 2018-03-04 NOTE — Progress Notes (Signed)
Pt and daughter stopped by after radiation treatment today because they were worried pt was having diarrhea, and she is normally constipated. Based on the number of radiation treatments pt has received I assured the pt and her daughter that this was an expected side effect. Advised them that they could pick up some Imodium OTC and use to help with symptoms in the meantime. Instructed the pt and daughter to continue to monitor, and should the BMs increase and pt begin to feel more weak/tired, to call and let us know and we can try something else. Both verbalized understanding and agreement.

## 2018-03-07 ENCOUNTER — Ambulatory Visit: Payer: Medicare Other

## 2018-03-07 ENCOUNTER — Telehealth: Payer: Self-pay | Admitting: Oncology

## 2018-03-07 ENCOUNTER — Telehealth: Payer: Self-pay

## 2018-03-07 NOTE — Telephone Encounter (Signed)
Received message from [facility] daughter [name], that she is having more diarrhea and will be late coming to her radiation appointment today.  Called [name] back and left a message.  Also notified Katelin, RN who said she has been in contact with [name].

## 2018-03-07 NOTE — Telephone Encounter (Signed)
Received phone call from pt's daughter stating that pt was having more stomach cramping today and was too tired to come in for radiation treatment. Made Dr. Sondra Come aware who stated it was fine for pt to miss today if she didn't feel up to it. Let daughter know it was alright for pt to stay home today, but reinforced that she needed to pick up Imodium and make sure pt had plenty of fluids throughout the day. Daughter verbalized understanding and stated she was going to the drugstore as soon as she hung up.   Called Jennifer-RT on L# and informed her pt would not be around for treatment today.

## 2018-03-08 ENCOUNTER — Ambulatory Visit
Admission: RE | Admit: 2018-03-08 | Discharge: 2018-03-08 | Disposition: A | Discharge status: Home / Self Care | Payer: Medicare Other | Source: Ambulatory Visit | Attending: Radiation Oncology | Admitting: Radiation Oncology

## 2018-03-08 DIAGNOSIS — C55 Malignant neoplasm of uterus, part unspecified: Secondary | ICD-10-CM | POA: Diagnosis not present

## 2018-03-09 ENCOUNTER — Ambulatory Visit
Admission: RE | Admit: 2018-03-09 | Discharge: 2018-03-09 | Disposition: A | Discharge status: Home / Self Care | Payer: Medicare Other | Source: Ambulatory Visit | Attending: Radiation Oncology | Admitting: Radiation Oncology

## 2018-03-09 DIAGNOSIS — C55 Malignant neoplasm of uterus, part unspecified: Secondary | ICD-10-CM | POA: Diagnosis not present

## 2018-03-10 ENCOUNTER — Ambulatory Visit
Admission: RE | Admit: 2018-03-10 | Discharge: 2018-03-10 | Disposition: A | Discharge status: Home / Self Care | Payer: Medicare Other | Source: Ambulatory Visit | Attending: Radiation Oncology | Admitting: Radiation Oncology

## 2018-03-10 DIAGNOSIS — C55 Malignant neoplasm of uterus, part unspecified: Secondary | ICD-10-CM | POA: Diagnosis not present

## 2018-03-11 ENCOUNTER — Ambulatory Visit
Admission: RE | Admit: 2018-03-11 | Discharge: 2018-03-11 | Disposition: A | Discharge status: Home / Self Care | Payer: Medicare Other | Source: Ambulatory Visit | Attending: Radiation Oncology | Admitting: Radiation Oncology

## 2018-03-11 DIAGNOSIS — C55 Malignant neoplasm of uterus, part unspecified: Secondary | ICD-10-CM | POA: Diagnosis not present

## 2018-03-15 ENCOUNTER — Ambulatory Visit
Admission: RE | Admit: 2018-03-15 | Discharge: 2018-03-15 | Disposition: A | Discharge status: Home / Self Care | Payer: Medicare Other | Source: Ambulatory Visit | Attending: Radiation Oncology | Admitting: Radiation Oncology

## 2018-03-15 DIAGNOSIS — C55 Malignant neoplasm of uterus, part unspecified: Secondary | ICD-10-CM | POA: Diagnosis not present

## 2018-03-16 ENCOUNTER — Ambulatory Visit
Admission: RE | Admit: 2018-03-16 | Discharge: 2018-03-16 | Disposition: A | Discharge status: Home / Self Care | Payer: Medicare Other | Source: Ambulatory Visit | Attending: Radiation Oncology | Admitting: Radiation Oncology

## 2018-03-16 DIAGNOSIS — C55 Malignant neoplasm of uterus, part unspecified: Secondary | ICD-10-CM | POA: Diagnosis not present

## 2018-03-17 ENCOUNTER — Ambulatory Visit
Admission: RE | Admit: 2018-03-17 | Discharge: 2018-03-17 | Disposition: A | Discharge status: Home / Self Care | Payer: Medicare Other | Source: Ambulatory Visit | Attending: Radiation Oncology | Admitting: Radiation Oncology

## 2018-03-17 DIAGNOSIS — C55 Malignant neoplasm of uterus, part unspecified: Secondary | ICD-10-CM | POA: Diagnosis not present

## 2018-03-18 ENCOUNTER — Ambulatory Visit
Admission: RE | Admit: 2018-03-18 | Discharge: 2018-03-18 | Disposition: A | Discharge status: Home / Self Care | Payer: Medicare Other | Source: Ambulatory Visit | Attending: Radiation Oncology | Admitting: Radiation Oncology

## 2018-03-18 DIAGNOSIS — C55 Malignant neoplasm of uterus, part unspecified: Secondary | ICD-10-CM | POA: Diagnosis not present

## 2018-03-18 NOTE — Progress Notes (Signed)
HEMATOLOGY ONCOLOGY CLINIC NOTE  Date of Service: .03/21/2018   Patient Care Team: Lajean Manes, MD as PCP - General (Internal Medicine)  CHIEF COMPLAINTS/PURPOSE OF CONSULTATION:  Anemia F/u for recently diagnosed uterine papillary carcinoma.  HISTORY OF PRESENTING ILLNESS:   Wendy [name] 84 y.o. female is here because of worsening anemia.  Patient has a history of hypertension and hypothyroidism who as per her daughter has been a very active person until recently. Her daughter notes that patient's levothyroxine dose was reduced from 75 mcg to 50 mcg daily and this produced a significant change in the patient's cognition and energy levels.  She reports that it is very unusual for the patient to wake up late and then sleep throughout the day as she is currently doing. Per the patient's daughter she was a very active person, who would do a lot of walking, including walking around the track.   She notes that the patient has been slowly declining over the past year ever since seeing a new provider who started decreasing her thyroid medication. The patient's appetite has been declining as well.    She was seen in the ED for two syncopal episodes. On 09/04/2018 .  She was noted to be hypotensive and hypoxic.  Symptoms were thought to be related to dehydration due to poor oral intake.  Echo showed normal ejection fraction of 60-65% with grade 2 diastolic dysfunction.  Her losartan dose was significantly reduced on discharge.  She was noted to have several episodes of nonsustained VT but her heart rate was in the 50s.  Hemoglobin was noted to be between 7 and 7.5 but transfusion was withheld due to presence of positive Coombs test and outpatient hematology consult was recommended. FOBT was never sent.  She was found to have abnormal CBC from 09/07/2017 -with hemoglobin of 7 MCV of 95, WBC count of 3k with an ANC of 2.3k.  And a platelet count of 175k.  Reticulocyte percentage of 1.9,  absolute reticulocyte count of 45.8k/ul .  She was noted to have a positive Coombs test but her LDH and haptoglobin levels were within normal limits suggesting against overt hemolysis.  ANA negative. Sedimentation rate 138.  CRP within normal limits. Free T4 0.87 Ferritin 857 with 23% iron saturation. Folate 19.5 B12 - 470  She denies recent chest pain on exertion, shortness of breath on minimal exertion, or palpitations. She denies cough. She had not noticed any recent bleeding such as epistaxis, hematuria or hematochezia.   The patient denies over the counter NSAID ingestion. She is not on antiplatelets agents.   She had no prior history or diagnosis of cancer. Her age appropriate screening programs are up-to-date.  She denies any pica and eats a variety of diet.  She never donated blood or received blood transfusion.   Some days her appetite is better than others. The patients daughter states she has also become more forgetful at times.  Pt denies fever, chills, trouble swelling, abdominal pain, or any other symptoms or complaints at this time.  Patient really knows that she still feels somewhat fatigued but has no overt lightheadedness or dizziness at this time and feels better than when she was admitted. No overt infectious issues. No overt evidence of bleeding.  Some unquantified weight loss due to decreased appetite. No other overt focal symptoms.  Interval History:   NYAIRA [name] is here regarding her borderline macrocytic anemia  and leucopenia and her newly diagnosed uterine high grade papillary caricnoma. The  patient's last visit with [place] was on 02/21/18. She is accompanied today by her daughter. The pt reports that she is doing well overall and thoroughly enjoyed her recent 48th birthday.   The pt reports some continuing vaginal discharge accompanied by bleeding one day about a week ago. She notes that her eating has been getting worse with a decreased appetite but she has  used boost/ensure. She also notes that she has had some mild diarrhea as well. She notes no medication changes.   She will be completing radiation treatment next week.   Lab results today (03/21/18) of CBC, and Reticulocytes is as follows: all values are WNL except for WBC at 3.0k, RBC at 2.26, Hgb at 7.1, HCT at 21.7, RDW at 16.2, Lymphs Abs at 0.3k, Retic ct pct at 2.8%. Ferritin 03/21/18 is elevated at 1328.   On review of systems, pt reports some vaginal discharge, some dizziness, decreased appetite, mild diarrhea, and denies pain moving her bowels, blood in the stools, abdominal pains, leg swelling, and any other symptoms.    MEDICAL HISTORY:  Past Medical History:  Diagnosis Date  . Hypertension   . Thyroid disease   Nonsustained VT Hypothyroidism Acute kidney injury 08/2017 Depression-on Celexa.  SURGICAL HISTORY: Past Surgical History:  Procedure Laterality Date  . CORONARY ANGIOPLASTY      SOCIAL HISTORY: Social History   Socioeconomic History  . Marital status: Widowed    Spouse name: Not on file  . Number of children: Not on file  . Years of education: Not on file  . Highest education level: Not on file  Occupational History  . Not on file  Social Needs  . Financial resource strain: Not on file  . Food insecurity:    Worry: Not on file    Inability: Not on file  . Transportation needs:    Medical: Not on file    Non-medical: Not on file  Tobacco Use  . Smoking status: Never Smoker  . Smokeless tobacco: Never Used  Substance and Sexual Activity  . Alcohol use: No  . Drug use: No  . Sexual activity: Never  Lifestyle  . Physical activity:    Days per week: Not on file    Minutes per session: Not on file  . Stress: Not on file  Relationships  . Social connections:    Talks on phone: Not on file    Gets together: Not on file    Attends religious service: Not on file    Active member of club or organization: Not on file    Attends meetings of clubs or  organizations: Not on file    Relationship status: Not on file  . Intimate partner violence:    Fear of current or ex partner: Not on file    Emotionally abused: Not on file    Physically abused: Not on file    Forced sexual activity: Not on file  Other Topics Concern  . Not on file  Social History Narrative  . Not on file    FAMILY HISTORY: No family history on file.  ALLERGIES:  has No Known Allergies.  MEDICATIONS:  Current Outpatient Medications  Medication Sig Dispense Refill  . aspirin EC 81 MG tablet Take 81 mg daily after supper by mouth.     . citalopram (CELEXA) 10 MG tablet Take 5 mg daily after supper by mouth.     . dexamethasone (DECADRON) 2 MG tablet Take 1 tablet (2 mg total) by mouth daily after  breakfast. 10 tablet 0  . ENSURE (ENSURE) Take 237 mLs by mouth 2 (two) times daily between meals.    . hydrocortisone cream 1 % Apply to affected area 2 times daily 15 g 0  . levothyroxine (SYNTHROID, LEVOTHROID) 75 MCG tablet Take 75 mcg by mouth daily before breakfast.    . losartan (COZAAR) 50 MG tablet Take 50 mg by mouth daily.    Marland Kitchen triamcinolone (KENALOG) 0.025 % cream Apply 1 application 2 (two) times daily as needed topically (rash).    . Vitamin D, Ergocalciferol, (DRISDOL) 50000 units CAPS capsule Take 50,000 Units every 7 (seven) days by mouth.     No current facility-administered medications for this visit.     REVIEW OF SYSTEMS:    A 10+ POINT REVIEW OF SYSTEMS WAS OBTAINED including neurology, dermatology, psychiatry, cardiac, respiratory, lymph, extremities, GI, GU, Musculoskeletal, constitutional, breasts, reproductive, HEENT.  All pertinent positives are noted in the HPI.  All others are negative.    PHYSICAL EXAMINATION: ECOG PERFORMANCE STATUS: 2 - Symptomatic, <50% confined to bed  Vitals:   03/21/18 1210  BP: 134/61  Pulse: 73  Resp: 18  Temp: 97.9 F (36.6 C)  SpO2: 100%   Filed Weights   03/21/18 1210  Weight: 132 lb 6.4 oz (60.1  kg)     GENERAL:alert, in no acute distress and comfortable SKIN: no acute rashes, no significant lesions EYES: conjunctiva are pink and non-injected, sclera anicteric OROPHARYNX: MMM, no exudates, no oropharyngeal erythema or ulceration NECK: supple, no JVD LYMPH:  no palpable lymphadenopathy in the cervical, axillary or inguinal regions LUNGS: clear to auscultation b/l with normal respiratory effort HEART: regular rate & rhythm ABDOMEN:  normoactive bowel sounds , non tender, not distended. Extremity: no pedal edema PSYCH: alert & oriented x 3 with fluent speech NEURO: no focal motor/sensory deficits   LABORATORY DATA:  I have reviewed the data as listed  . CBC Latest Ref Rng & Units 03/21/2018 02/21/2018 01/18/2018  WBC 3.9 - 10.3 K/uL 3.0(L) 2.7(L) 2.7(L)  Hemoglobin 11.6 - 15.9 g/dL 7.1(L) 9.8(L) 9.8(L)  Hematocrit 34.8 - 46.6 % 21.7(L) 30.0(L) 29.9(L)  Platelets 145 - 400 K/uL 208 192 198   . CMP Latest Ref Rng & Units 03/21/2018 02/21/2018 01/18/2018  Glucose 70 - 140 mg/dL 94 86 85  BUN 7 - 26 mg/dL 14 16 17   Creatinine 0.60 - 1.10 mg/dL 1.04 1.13(H) 1.12(H)  Sodium 136 - 145 mmol/L 139 138 138  Potassium 3.5 - 5.1 mmol/L 3.9 4.2 4.0  Chloride 98 - 109 mmol/L 107 106 105  CO2 22 - 29 mmol/L 25 27 27   Calcium 8.4 - 10.4 mg/dL 9.2 9.6 9.4  Total Protein 6.4 - 8.3 g/dL 8.4(H) 8.3 8.2  Total Bilirubin 0.2 - 1.2 mg/dL 0.7 0.6 0.6  Alkaline Phos 40 - 150 U/L 63 64 62  AST 5 - 34 U/L 24 23 23   ALT 0 - 55 U/L 6 8 12         RADIOGRAPHIC STUDIES: I have personally reviewed the radiological images as listed and agreed with the findings in the report. No results found.  ASSESSMENT & PLAN:   84 y.o. female   1) Normocytic Anemia likely multifactorial  Likely MDS + blood loss from uterine high grade papillary uterine carcinoma with vaginal bleeding + Anemia of chronic disease related to newly diagnosed malignancy.  Slightly elevated LDH level at 297 but normal haptoglobin  suggesting against acute hemolysis.   Homocystine is elevated though B12  level is within normal limits and RBC folate- WNL  10/28/17 MM panel showed no M-Protein, with elevated IgG and IgM. Kappa and lambda light chain were both elevated- likely reactive to newly diagnosed malignancy. No overt evidence of GI bleeding.  No evidence of iron deficiency. Cannot rule out MDS given the patient's advanced age.  2) leukopenia with mild neutropenia 1.3k  3) Elevated TSH with normal FT4 -TSH is Elevated at 8.99 (normal FT4) in the setting of recent decrease in levothyroxine dose from 39mcg to 40mcg per PCP. Now back at 81mcg po daily.  4) Elevated LDH levels -no overt evidence of hemolysis given normal haptoglobin level and no reticulocytosis and normal bilirubin levels.  Likely from uterine papillary carcinoma 5) Positive direct Coombs test - no evidence of overt hemolysis at this time.  6) Recently diagnosed high grade papillary uterine carcinoma . CT with no evidence of metastatic disease PLAN  -Discussed pt labwork today, 03/21/18; Hgb has decreased to 7.1 -Offered blood transfusion today in light of Hgb at 7.1, pt does not want to have this at this time -Pt will let me know if she begins feeling more dizzy or light headed or more fatigued -Will recheck blood counts in 2 weeks  -Continue with palliative radiation -2mg  Dexamethasone for 10 days to help stimulate appetite  5) . Patient Active Problem List   Diagnosis Date Noted  . Uterine carcinoma ([place]) 01/31/2018  . Syncope 09/28/2013  . Altered mental status 09/26/2013  . Syncope and collapse 09/26/2013  . Hypothyroidism 09/26/2013  . Depression 09/26/2013  . HTN (hypertension) 09/26/2013  Plan -continue f/u with PCP    RTC in 2weeks with labs with Dr Irene Limbo    The toal time spent in the appt was 20inutes and more than 50% was on counseling and direct patient cares.   Sullivan Lone MD [place] AAHIVMS [facility] Hematology/Oncology  Physician [facility]  (Office):       [phone] (Work cell):  [phone] (Fax):           [phone]    I, Baldwin [place], am acting as a scribe for Dr Irene Limbo.   .I have reviewed the above documentation for accuracy and completeness, and I agree with the above. Brunetta Genera MD

## 2018-03-21 ENCOUNTER — Telehealth: Payer: Self-pay

## 2018-03-21 ENCOUNTER — Ambulatory Visit
Admission: RE | Admit: 2018-03-21 | Discharge: 2018-03-21 | Disposition: A | Discharge status: Home / Self Care | Payer: Medicare Other | Source: Ambulatory Visit | Attending: Radiation Oncology | Admitting: Radiation Oncology

## 2018-03-21 ENCOUNTER — Inpatient Hospital Stay: Payer: Medicare Other | Attending: Hematology | Admitting: Hematology

## 2018-03-21 ENCOUNTER — Inpatient Hospital Stay: Payer: Medicare Other

## 2018-03-21 VITALS — BP 134/61 | HR 73 | Temp 97.9°F | Resp 18 | Ht 62.0 in | Wt 132.4 lb

## 2018-03-21 DIAGNOSIS — I10 Essential (primary) hypertension: Secondary | ICD-10-CM | POA: Diagnosis not present

## 2018-03-21 DIAGNOSIS — C55 Malignant neoplasm of uterus, part unspecified: Secondary | ICD-10-CM

## 2018-03-21 DIAGNOSIS — D709 Neutropenia, unspecified: Secondary | ICD-10-CM | POA: Diagnosis not present

## 2018-03-21 DIAGNOSIS — Z923 Personal history of irradiation: Secondary | ICD-10-CM | POA: Diagnosis not present

## 2018-03-21 DIAGNOSIS — R740 Nonspecific elevation of levels of transaminase and lactic acid dehydrogenase [LDH]: Secondary | ICD-10-CM | POA: Insufficient documentation

## 2018-03-21 DIAGNOSIS — D649 Anemia, unspecified: Secondary | ICD-10-CM | POA: Insufficient documentation

## 2018-03-21 DIAGNOSIS — E039 Hypothyroidism, unspecified: Secondary | ICD-10-CM

## 2018-03-21 DIAGNOSIS — R630 Anorexia: Secondary | ICD-10-CM | POA: Diagnosis not present

## 2018-03-21 DIAGNOSIS — Z510 Encounter for antineoplastic radiation therapy: Secondary | ICD-10-CM | POA: Insufficient documentation

## 2018-03-21 DIAGNOSIS — R5383 Other fatigue: Secondary | ICD-10-CM

## 2018-03-21 LAB — CBC WITH DIFFERENTIAL (CANCER CENTER ONLY)
BASOS ABS: 0 10*3/uL (ref 0.0–0.1)
Basophils Relative: 0 %
EOS ABS: 0.3 10*3/uL (ref 0.0–0.5)
Eosinophils Relative: 10 %
HEMATOCRIT: 21.7 % — AB (ref 34.8–46.6)
Hemoglobin: 7.1 g/dL — ABNORMAL LOW (ref 11.6–15.9)
LYMPHS ABS: 0.3 10*3/uL — AB (ref 0.9–3.3)
LYMPHS PCT: 11 %
MCH: 31.4 pg (ref 25.1–34.0)
MCHC: 32.7 g/dL (ref 31.5–36.0)
MCV: 96 fL (ref 79.5–101.0)
Monocytes Absolute: 0.3 10*3/uL (ref 0.1–0.9)
Monocytes Relative: 10 %
NEUTROS ABS: 2 10*3/uL (ref 1.5–6.5)
NEUTROS PCT: 69 %
Platelet Count: 208 10*3/uL (ref 145–400)
RBC: 2.26 MIL/uL — ABNORMAL LOW (ref 3.70–5.45)
RDW: 16.2 % — ABNORMAL HIGH (ref 11.2–14.5)
WBC Count: 3 10*3/uL — ABNORMAL LOW (ref 3.9–10.3)

## 2018-03-21 LAB — CMP (CANCER CENTER ONLY)
ALBUMIN: 3.7 g/dL (ref 3.5–5.0)
ALT: <6 U/L (ref 0–55)
ANION GAP: 7 (ref 3–11)
AST: 24 U/L (ref 5–34)
Alkaline Phosphatase: 63 U/L (ref 40–150)
BUN: 14 mg/dL (ref 7–26)
CALCIUM: 9.2 mg/dL (ref 8.4–10.4)
CHLORIDE: 107 mmol/L (ref 98–109)
CO2: 25 mmol/L (ref 22–29)
Creatinine: 1.04 mg/dL (ref 0.60–1.10)
GFR, EST AFRICAN AMERICAN: 50 mL/min — AB (ref >60)
GFR, Estimated: 43 mL/min — ABNORMAL LOW (ref >60)
GLUCOSE: 94 mg/dL (ref 70–140)
Potassium: 3.9 mmol/L (ref 3.5–5.1)
SODIUM: 139 mmol/L (ref 136–145)
Total Bilirubin: 0.7 mg/dL (ref 0.2–1.2)
Total Protein: 8.4 g/dL — ABNORMAL HIGH (ref 6.4–8.3)

## 2018-03-21 LAB — RETICULOCYTES
RBC.: 2.26 MIL/uL — ABNORMAL LOW (ref 3.70–5.45)
RETIC CT PCT: 2.8 % — AB (ref 0.7–2.1)
Retic Count, Absolute: 63.3 10*3/uL (ref 33.7–90.7)

## 2018-03-21 LAB — FERRITIN: Ferritin: 1328 ng/mL — ABNORMAL HIGH (ref 9–269)

## 2018-03-21 MED ORDER — DEXAMETHASONE 2 MG PO TABS: 2.0000 mg | ORAL_TABLET | Freq: Every day | ORAL | 0 refills | Status: DC

## 2018-03-21 NOTE — Telephone Encounter (Signed)
Printed avs and calender of upcoming appointment. Per 6/3 los 

## 2018-03-22 ENCOUNTER — Ambulatory Visit
Admission: RE | Admit: 2018-03-22 | Discharge: 2018-03-22 | Disposition: A | Discharge status: Home / Self Care | Payer: Medicare Other | Source: Ambulatory Visit | Attending: Radiation Oncology | Admitting: Radiation Oncology

## 2018-03-22 DIAGNOSIS — Z510 Encounter for antineoplastic radiation therapy: Secondary | ICD-10-CM | POA: Diagnosis not present

## 2018-03-23 ENCOUNTER — Telehealth: Payer: Self-pay

## 2018-03-23 ENCOUNTER — Ambulatory Visit
Admission: RE | Admit: 2018-03-23 | Discharge: 2018-03-23 | Disposition: A | Discharge status: Home / Self Care | Payer: Medicare Other | Source: Ambulatory Visit | Attending: Radiation Oncology | Admitting: Radiation Oncology

## 2018-03-23 DIAGNOSIS — Z510 Encounter for antineoplastic radiation therapy: Secondary | ICD-10-CM | POA: Diagnosis not present

## 2018-03-23 NOTE — Telephone Encounter (Signed)
Fax sent to [facility] with signed orders for patient [phone]. Confirmed fax receipt 03/23/18 at 1546.

## 2018-03-24 ENCOUNTER — Ambulatory Visit
Admission: RE | Admit: 2018-03-24 | Discharge: 2018-03-24 | Disposition: A | Discharge status: Home / Self Care | Payer: Medicare Other | Source: Ambulatory Visit | Attending: Radiation Oncology | Admitting: Radiation Oncology

## 2018-03-25 ENCOUNTER — Ambulatory Visit: Admission: RE | Admit: 2018-03-25 | Payer: Medicare Other | Source: Ambulatory Visit

## 2018-03-28 ENCOUNTER — Ambulatory Visit: Payer: Medicare Other

## 2018-03-28 ENCOUNTER — Other Ambulatory Visit: Payer: Self-pay

## 2018-03-28 ENCOUNTER — Telehealth: Payer: Self-pay

## 2018-03-28 ENCOUNTER — Ambulatory Visit
Admission: RE | Admit: 2018-03-28 | Discharge: 2018-03-28 | Disposition: A | Discharge status: Home / Self Care | Payer: Medicare Other | Source: Ambulatory Visit | Attending: Radiation Oncology | Admitting: Radiation Oncology

## 2018-03-28 DIAGNOSIS — Z510 Encounter for antineoplastic radiation therapy: Secondary | ICD-10-CM | POA: Diagnosis not present

## 2018-03-28 NOTE — Progress Notes (Signed)
Telephone call and orders only encounters opened in error

## 2018-03-29 ENCOUNTER — Ambulatory Visit: Payer: Medicare Other

## 2018-03-29 ENCOUNTER — Other Ambulatory Visit: Payer: Self-pay

## 2018-03-29 ENCOUNTER — Telehealth: Payer: Self-pay

## 2018-03-29 ENCOUNTER — Ambulatory Visit
Admission: RE | Admit: 2018-03-29 | Discharge: 2018-03-29 | Disposition: A | Discharge status: Home / Self Care | Payer: Medicare Other | Source: Ambulatory Visit | Attending: Radiation Oncology | Admitting: Radiation Oncology

## 2018-03-29 DIAGNOSIS — Z510 Encounter for antineoplastic radiation therapy: Secondary | ICD-10-CM | POA: Diagnosis not present

## 2018-03-29 DIAGNOSIS — C55 Malignant neoplasm of uterus, part unspecified: Secondary | ICD-10-CM

## 2018-03-29 NOTE — Telephone Encounter (Signed)
Called pt's daughter with update of lab appt at 13:30 and nutrition appt at 16:00 added to 03/31/18. Daughter stated she didn't think 16:00 would work for their schedule. Informed Wendy [name] who stated she would call daughter back with other options. Updated daughter who verbalized understanding and agreement.

## 2018-03-30 ENCOUNTER — Ambulatory Visit
Admission: RE | Admit: 2018-03-30 | Discharge: 2018-03-30 | Disposition: A | Discharge status: Home / Self Care | Payer: Medicare Other | Source: Ambulatory Visit | Attending: Radiation Oncology | Admitting: Radiation Oncology

## 2018-03-30 ENCOUNTER — Ambulatory Visit: Payer: Medicare Other

## 2018-03-30 DIAGNOSIS — Z510 Encounter for antineoplastic radiation therapy: Secondary | ICD-10-CM | POA: Diagnosis not present

## 2018-03-31 ENCOUNTER — Encounter: Payer: Self-pay | Admitting: Radiation Oncology

## 2018-03-31 ENCOUNTER — Inpatient Hospital Stay: Payer: Medicare Other | Admitting: Nutrition

## 2018-03-31 ENCOUNTER — Telehealth: Payer: Self-pay | Admitting: Oncology

## 2018-03-31 ENCOUNTER — Ambulatory Visit
Admission: RE | Admit: 2018-03-31 | Discharge: 2018-03-31 | Disposition: A | Discharge status: Home / Self Care | Payer: Medicare Other | Source: Ambulatory Visit | Attending: Radiation Oncology | Admitting: Radiation Oncology

## 2018-03-31 ENCOUNTER — Ambulatory Visit: Payer: Medicare Other

## 2018-03-31 DIAGNOSIS — Z510 Encounter for antineoplastic radiation therapy: Secondary | ICD-10-CM | POA: Diagnosis not present

## 2018-03-31 NOTE — Progress Notes (Signed)
  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 At the beginning of treatments, she noted abdominal pain, pinkish vaginal discharge, mild fatigue, and constipation. She denied rectal bleeding, nausea, vomiting, or dysuria, Towards the end of treatment, she noted diarrhea and fatigue. An exam was deferred at that time per her request.   Plan: The patient has completed radiation treatment. She will return to the office in 1.5 weeks for an exam to determine if she would be a candidate for external beam boost or possibly brachytherapy treatments. I advised them to call or return sooner if they have any questions or concerns related to their recovery or treatment.  -----------------------------------  Blair Promise, PhD, MD  This document serves as a record of services personally performed by Gery Pray, MD. It was created on his behalf by [facility], a trained medical scribe. The creation of this record is based on the scribe's personal observations and the provider's statements to them. This document has been checked and approved by the attending provider.

## 2018-03-31 NOTE — Telephone Encounter (Signed)
Patient's daughter, [name], called about appointment times today.  Verified times with her.  She said her mom was not feeling well this morning due to diarrhea and did take Imodium.  She will try to get her up for her appointments today.  Advised her to call back if they are not able to make it.

## 2018-04-05 ENCOUNTER — Inpatient Hospital Stay: Payer: Medicare Other | Admitting: Nutrition

## 2018-04-05 ENCOUNTER — Telehealth: Payer: Self-pay | Admitting: Oncology

## 2018-04-05 ENCOUNTER — Inpatient Hospital Stay: Payer: Medicare Other | Admitting: Hematology

## 2018-04-05 ENCOUNTER — Inpatient Hospital Stay: Payer: Medicare Other

## 2018-04-05 NOTE — Telephone Encounter (Signed)
Hilda Blades (patient's daughter) called and said they would not be able to make their appointments today for nutrition, lab and to see [name].  Kelda is not feeling well and is still sleeping.  Transferred her to Dr. Grier Mitts nurse and also left a message for Ernestene Kiel, Dietician.

## 2018-04-11 NOTE — Telephone Encounter (Signed)
opened in error

## 2018-04-12 ENCOUNTER — Encounter: Payer: Self-pay | Admitting: Radiation Oncology

## 2018-04-12 ENCOUNTER — Inpatient Hospital Stay: Payer: Medicare Other

## 2018-04-12 ENCOUNTER — Ambulatory Visit
Admission: RE | Admit: 2018-04-12 | Discharge: 2018-04-12 | Disposition: A | Discharge status: Home / Self Care | Payer: Medicare Other | Source: Ambulatory Visit | Attending: Radiation Oncology | Admitting: Radiation Oncology

## 2018-04-12 ENCOUNTER — Inpatient Hospital Stay (HOSPITAL_BASED_OUTPATIENT_CLINIC_OR_DEPARTMENT_OTHER): Payer: Medicare Other | Admitting: Hematology

## 2018-04-12 ENCOUNTER — Other Ambulatory Visit: Payer: Self-pay

## 2018-04-12 ENCOUNTER — Inpatient Hospital Stay: Payer: Medicare Other | Admitting: Nutrition

## 2018-04-12 VITALS — BP 132/57 | HR 71 | Temp 97.9°F | Resp 18 | Ht 62.0 in | Wt 130.5 lb

## 2018-04-12 VITALS — BP 132/56 | HR 66 | Temp 98.0°F | Resp 20 | Wt 129.8 lb

## 2018-04-12 DIAGNOSIS — Z923 Personal history of irradiation: Secondary | ICD-10-CM

## 2018-04-12 DIAGNOSIS — C55 Malignant neoplasm of uterus, part unspecified: Secondary | ICD-10-CM

## 2018-04-12 DIAGNOSIS — D539 Nutritional anemia, unspecified: Secondary | ICD-10-CM | POA: Diagnosis not present

## 2018-04-12 DIAGNOSIS — Z7982 Long term (current) use of aspirin: Secondary | ICD-10-CM | POA: Diagnosis not present

## 2018-04-12 DIAGNOSIS — D649 Anemia, unspecified: Secondary | ICD-10-CM

## 2018-04-12 DIAGNOSIS — D500 Iron deficiency anemia secondary to blood loss (chronic): Secondary | ICD-10-CM

## 2018-04-12 DIAGNOSIS — I10 Essential (primary) hypertension: Secondary | ICD-10-CM

## 2018-04-12 DIAGNOSIS — R740 Nonspecific elevation of levels of transaminase and lactic acid dehydrogenase [LDH]: Secondary | ICD-10-CM | POA: Diagnosis not present

## 2018-04-12 DIAGNOSIS — E039 Hypothyroidism, unspecified: Secondary | ICD-10-CM | POA: Diagnosis not present

## 2018-04-12 DIAGNOSIS — N898 Other specified noninflammatory disorders of vagina: Secondary | ICD-10-CM | POA: Diagnosis not present

## 2018-04-12 DIAGNOSIS — Z79899 Other long term (current) drug therapy: Secondary | ICD-10-CM | POA: Insufficient documentation

## 2018-04-12 DIAGNOSIS — D709 Neutropenia, unspecified: Secondary | ICD-10-CM

## 2018-04-12 DIAGNOSIS — R630 Anorexia: Secondary | ICD-10-CM

## 2018-04-12 DIAGNOSIS — R197 Diarrhea, unspecified: Secondary | ICD-10-CM | POA: Diagnosis not present

## 2018-04-12 DIAGNOSIS — D72819 Decreased white blood cell count, unspecified: Secondary | ICD-10-CM | POA: Diagnosis not present

## 2018-04-12 LAB — CMP (CANCER CENTER ONLY)
ALT: 8 U/L (ref 0–44)
ANION GAP: 9 (ref 5–15)
AST: 20 U/L (ref 15–41)
Albumin: 3.9 g/dL (ref 3.5–5.0)
Alkaline Phosphatase: 61 U/L (ref 38–126)
BILIRUBIN TOTAL: 0.7 mg/dL (ref 0.3–1.2)
BUN: 15 mg/dL (ref 8–23)
CO2: 25 mmol/L (ref 22–32)
Calcium: 9.6 mg/dL (ref 8.9–10.3)
Chloride: 107 mmol/L (ref 98–111)
Creatinine: 1.06 mg/dL — ABNORMAL HIGH (ref 0.44–1.00)
GFR, Est AFR Am: 49 mL/min — ABNORMAL LOW (ref >60)
GFR, Estimated: 42 mL/min — ABNORMAL LOW (ref >60)
Glucose, Bld: 87 mg/dL (ref 70–99)
POTASSIUM: 4 mmol/L (ref 3.5–5.1)
Sodium: 141 mmol/L (ref 135–145)
TOTAL PROTEIN: 8.3 g/dL — AB (ref 6.5–8.1)

## 2018-04-12 LAB — CBC WITH DIFFERENTIAL/PLATELET
BASOS ABS: 0 10*3/uL (ref 0.0–0.1)
Basophils Relative: 1 %
EOS PCT: 3 %
Eosinophils Absolute: 0.1 10*3/uL (ref 0.0–0.5)
HCT: 24.4 % — ABNORMAL LOW (ref 34.8–46.6)
Hemoglobin: 8.1 g/dL — ABNORMAL LOW (ref 11.6–15.9)
LYMPHS PCT: 13 %
Lymphs Abs: 0.3 10*3/uL — ABNORMAL LOW (ref 0.9–3.3)
MCH: 32.3 pg (ref 25.1–34.0)
MCHC: 33.3 g/dL (ref 31.5–36.0)
MCV: 96.9 fL (ref 79.5–101.0)
Monocytes Absolute: 0.4 10*3/uL (ref 0.1–0.9)
Monocytes Relative: 18 %
Neutro Abs: 1.5 10*3/uL (ref 1.5–6.5)
Neutrophils Relative %: 65 %
Platelets: 205 10*3/uL (ref 145–400)
RBC: 2.52 MIL/uL — AB (ref 3.70–5.45)
RDW: 18.5 % — ABNORMAL HIGH (ref 11.2–14.5)
WBC: 2.3 10*3/uL — ABNORMAL LOW (ref 3.9–10.3)

## 2018-04-12 LAB — SAMPLE TO BLOOD BANK

## 2018-04-12 MED ORDER — POLYSACCHARIDE IRON COMPLEX 150 MG PO CAPS: 150.0000 mg | ORAL_CAPSULE | Freq: Every day | ORAL | 0 refills | Status: DC

## 2018-04-12 NOTE — Progress Notes (Signed)
Wendy [name] presents today for a follow up with Dr. Sondra Come. She completed her radiation treatment on 03/31/2018. Over all she states she feels fine, but her appetite has decreased and she has occasional diarrhea (reports Imodium has helped). Denies any pain/discomfort with urination. Reports vaginal discharge has resolved and denies any blood in urine. Reports her energy level has improved and she is not needing to rest as frequently.   Vitals:   04/12/18 1328  BP: (!) 132/56  Pulse: 66  Resp: 20  Temp: 98 F (36.7 C)  SpO2: 100%  Weight: 129 lb 12.8 oz (58.9 kg)    Wt Readings from Last 3 Encounters:  04/12/18 129 lb 12.8 oz (58.9 kg)  03/21/18 132 lb 6.4 oz (60.1 kg)  02/21/18 134 lb 9.6 oz (61.1 kg)

## 2018-04-12 NOTE — Progress Notes (Signed)
84 year old female diagnosed with uterine cancer and macrocytic anemia.  Past medical history includes thyroid disease and hypertension.  Medications include Celexa, Decadron, and Synthroid.  Labs include albumin 3.7 on June 3  Height: 62 inches. Weight: 129.8 pounds. Usual body weight: 140 pounds in March 2019. BMI: 23.74.  Patient reports she has a poor appetite with decreased oral intake. Diarrhea is a result of treatment. She enjoys Ensure or boost. Her daughter is supportive.  Nutrition diagnosis:  Unintended weight loss related to uterine cancer and associated treatments as evidenced by 7% weight loss over 3 months.  Intervention: Educated patient and daughter on strategies for increasing calories and protein. Encouraged patient to consume two oral nutrition supplements daily. Provided recipes for smoothies. Encourage patient to drink more water. Questions were answered.  Teach back method used.  Contact information provided.  Fact sheets were given.  Monitoring, evaluation, goals: Patient will tolerate increased calories and protein to minimize weight loss.  Next visit: To be scheduled as needed.  **Disclaimer: This note was dictated with voice recognition software. Similar sounding words can inadvertently be transcribed and this note may contain transcription errors which may not have been corrected upon publication of note.**

## 2018-04-12 NOTE — Progress Notes (Signed)
HEMATOLOGY ONCOLOGY CLINIC NOTE  Date of Service: 04/12/18   Patient Care Team: Lajean Manes, MD as PCP - General (Internal Medicine)  CHIEF COMPLAINTS/PURPOSE OF CONSULTATION:  Anemia F/u for recently diagnosed uterine papillary carcinoma.  HISTORY OF PRESENTING ILLNESS:   Wendy [name] 84 y.o. female is here because of worsening anemia.  Patient has a history of hypertension and hypothyroidism who as per her daughter has been a very active person until recently. Her daughter notes that patient's levothyroxine dose was reduced from 75 mcg to 50 mcg daily and this produced a significant change in the patient's cognition and energy levels.  She reports that it is very unusual for the patient to wake up late and then sleep throughout the day as she is currently doing. Per the patient's daughter she was a very active person, who would do a lot of walking, including walking around the track.   She notes that the patient has been slowly declining over the past year ever since seeing a new provider who started decreasing her thyroid medication. The patient's appetite has been declining as well.    She was seen in the ED for two syncopal episodes. On 09/04/2018 .  She was noted to be hypotensive and hypoxic.  Symptoms were thought to be related to dehydration due to poor oral intake.  Echo showed normal ejection fraction of 60-65% with grade 2 diastolic dysfunction.  Her losartan dose was significantly reduced on discharge.  She was noted to have several episodes of nonsustained VT but her heart rate was in the 50s.  Hemoglobin was noted to be between 7 and 7.5 but transfusion was withheld due to presence of positive Coombs test and outpatient hematology consult was recommended. FOBT was never sent.  She was found to have abnormal CBC from 09/07/2017 -with hemoglobin of 7 MCV of 95, WBC count of 3k with an ANC of 2.3k.  And a platelet count of 175k.  Reticulocyte percentage of 1.9,  absolute reticulocyte count of 45.8k/ul .  She was noted to have a positive Coombs test but her LDH and haptoglobin levels were within normal limits suggesting against overt hemolysis.  ANA negative. Sedimentation rate 138.  CRP within normal limits. Free T4 0.87 Ferritin 857 with 23% iron saturation. Folate 19.5 B12 - 470  She denies recent chest pain on exertion, shortness of breath on minimal exertion, or palpitations. She denies cough. She had not noticed any recent bleeding such as epistaxis, hematuria or hematochezia.   The patient denies over the counter NSAID ingestion. She is not on antiplatelets agents.   She had no prior history or diagnosis of cancer. Her age appropriate screening programs are up-to-date.  She denies any pica and eats a variety of diet.  She never donated blood or received blood transfusion.   Some days her appetite is better than others. The patients daughter states she has also become more forgetful at times.  Pt denies fever, chills, trouble swelling, abdominal pain, or any other symptoms or complaints at this time.  Patient really knows that she still feels somewhat fatigued but has no overt lightheadedness or dizziness at this time and feels better than when she was admitted. No overt infectious issues. No overt evidence of bleeding.  Some unquantified weight loss due to decreased appetite. No other overt focal symptoms.  Interval History:   Wendy [name] is here regarding her borderline macrocytic anemia  and leucopenia and her newly diagnosed uterine high grade papillary caricnoma. The  patient's last visit with [place] was on 03/21/18. She is accompanied today by her daughter. The pt reports that she is doing well overall. She finished radiation treatment on 03/31/18.   The pt reports that she has not been eating very well, noting that she "just doesn't want it." She has not taken Dexamethasone as previously prescribed. She also notes that her vaginal  bleeding has slowed and continues to have some discharge. She is not currently having any dizziness or light headedness but notes that she feels tired often.   Lab results today (04/12/18) of CBC, CMP is as follows: all values are WNL except for WBC at 2.3k, RBC at 2.52, HGB at 8.1, HCT at 24.4, RDW at 18.5, Lymphs abs at 300, Creatinine at 1.06, Total Protein at 8.3, GFR at 49.  On review of systems, pt reports weak appetite, feeling sleepy, some vaginal discharge, resolving vaginal bleeding, and denies light headedness, dizziness, abdominal pains, problems passing urine, problems moving her bowels, blood in the stools, and any other symptoms.   MEDICAL HISTORY:  Past Medical History:  Diagnosis Date  . Hypertension   . Thyroid disease   Nonsustained VT Hypothyroidism Acute kidney injury 08/2017 Depression-on Celexa.  SURGICAL HISTORY: Past Surgical History:  Procedure Laterality Date  . CORONARY ANGIOPLASTY      SOCIAL HISTORY: Social History   Socioeconomic History  . Marital status: Widowed    Spouse name: Not on file  . Number of children: Not on file  . Years of education: Not on file  . Highest education level: Not on file  Occupational History  . Not on file  Social Needs  . Financial resource strain: Not on file  . Food insecurity:    Worry: Not on file    Inability: Not on file  . Transportation needs:    Medical: Not on file    Non-medical: Not on file  Tobacco Use  . Smoking status: Never Smoker  . Smokeless tobacco: Never Used  Substance and Sexual Activity  . Alcohol use: No  . Drug use: No  . Sexual activity: Never  Lifestyle  . Physical activity:    Days per week: Not on file    Minutes per session: Not on file  . Stress: Not on file  Relationships  . Social connections:    Talks on phone: Not on file    Gets together: Not on file    Attends religious service: Not on file    Active member of club or organization: Not on file    Attends  meetings of clubs or organizations: Not on file    Relationship status: Not on file  . Intimate partner violence:    Fear of current or ex partner: Not on file    Emotionally abused: Not on file    Physically abused: Not on file    Forced sexual activity: Not on file  Other Topics Concern  . Not on file  Social History Narrative  . Not on file    FAMILY HISTORY: No family history on file.  ALLERGIES:  has No Known Allergies.  MEDICATIONS:  Current Outpatient Medications  Medication Sig Dispense Refill  . aspirin EC 81 MG tablet Take 81 mg daily after supper by mouth.     . citalopram (CELEXA) 10 MG tablet Take 5 mg daily after supper by mouth.     . dexamethasone (DECADRON) 2 MG tablet Take 1 tablet (2 mg total) by mouth daily after breakfast. 10 tablet  0  . ENSURE (ENSURE) Take 237 mLs by mouth 2 (two) times daily between meals.    . hydrocortisone cream 1 % Apply to affected area 2 times daily 15 g 0  . iron polysaccharides (NIFEREX) 150 MG capsule Take 1 capsule (150 mg total) by mouth daily. 30 capsule 0  . levothyroxine (SYNTHROID, LEVOTHROID) 75 MCG tablet Take 75 mcg by mouth daily before breakfast.    . losartan (COZAAR) 50 MG tablet Take 50 mg by mouth daily.    Marland Kitchen triamcinolone (KENALOG) 0.025 % cream Apply 1 application 2 (two) times daily as needed topically (rash).    . Vitamin D, Ergocalciferol, (DRISDOL) 50000 units CAPS capsule Take 50,000 Units every 7 (seven) days by mouth.     No current facility-administered medications for this visit.     REVIEW OF SYSTEMS:    A 10+ POINT REVIEW OF SYSTEMS WAS OBTAINED including neurology, dermatology, psychiatry, cardiac, respiratory, lymph, extremities, GI, GU, Musculoskeletal, constitutional, breasts, reproductive, HEENT.  All pertinent positives are noted in the HPI.  All others are negative.   PHYSICAL EXAMINATION: ECOG PERFORMANCE STATUS: 2 - Symptomatic, <50% confined to bed  Vitals:   04/12/18 1552  BP: (!)  132/57  Pulse: 71  Resp: 18  Temp: 97.9 F (36.6 C)  SpO2: 100%   Filed Weights   04/12/18 1552  Weight: 130 lb 8 oz (59.2 kg)     GENERAL:alert, in no acute distress and comfortable SKIN: no acute rashes, no significant lesions EYES: conjunctiva are pink and non-injected, sclera anicteric OROPHARYNX: MMM, no exudates, no oropharyngeal erythema or ulceration NECK: supple, no JVD LYMPH:  no palpable lymphadenopathy in the cervical, axillary or inguinal regions LUNGS: clear to auscultation b/l with normal respiratory effort HEART: regular rate & rhythm ABDOMEN:  normoactive bowel sounds , non tender, not distended. Extremity: no pedal edema PSYCH: alert & oriented x 3 with fluent speech NEURO: no focal motor/sensory deficits   LABORATORY DATA:  I have reviewed the data as listed  . CBC Latest Ref Rng & Units 04/12/2018 03/21/2018 02/21/2018  WBC 3.9 - 10.3 K/uL 2.3(L) 3.0(L) 2.7(L)  Hemoglobin 11.6 - 15.9 g/dL 8.1(L) 7.1(L) 9.8(L)  Hematocrit 34.8 - 46.6 % 24.4(L) 21.7(L) 30.0(L)  Platelets 145 - 400 K/uL 205 208 192   . CMP Latest Ref Rng & Units 04/12/2018 03/21/2018 02/21/2018  Glucose 70 - 99 mg/dL 87 94 86  BUN 8 - 23 mg/dL 15 14 16   Creatinine 0.44 - 1.00 mg/dL 1.06(H) 1.04 1.13(H)  Sodium 135 - 145 mmol/L 141 139 138  Potassium 3.5 - 5.1 mmol/L 4.0 3.9 4.2  Chloride 98 - 111 mmol/L 107 107 106  CO2 22 - 32 mmol/L 25 25 27   Calcium 8.9 - 10.3 mg/dL 9.6 9.2 9.6  Total Protein 6.5 - 8.1 g/dL 8.3(H) 8.4(H) 8.3  Total Bilirubin 0.3 - 1.2 mg/dL 0.7 0.7 0.6  Alkaline Phos 38 - 126 U/L 61 63 64  AST 15 - 41 U/L 20 24 23   ALT 0 - 44 U/L 8 <6 8        RADIOGRAPHIC STUDIES: I have personally reviewed the radiological images as listed and agreed with the findings in the report. No results found.  ASSESSMENT & PLAN:   84 y.o. female   1) Normocytic Anemia likely multifactorial  Likely MDS + blood loss from uterine high grade papillary uterine carcinoma with vaginal  bleeding + Anemia of chronic disease related to newly diagnosed malignancy.  Slightly elevated LDH  level at 297 but normal haptoglobin suggesting against acute hemolysis.   Homocystine is elevated though B12 level is within normal limits and RBC folate- WNL  10/28/17 MM panel showed no M-Protein, with elevated IgG and IgM. Kappa and lambda light chain were both elevated- likely reactive to newly diagnosed malignancy. No overt evidence of GI bleeding.  No evidence of iron deficiency. Cannot rule out MDS given the patient's advanced age.  2) leukopenia 2.3k with mild neutropenia 1.5k  3)hypothyroidism  -on levothyroxine replacement on 35mcg po daily.  4) Elevated LDH levels -no overt evidence of hemolysis given normal haptoglobin level and no reticulocytosis and normal bilirubin levels.  Likely from uterine papillary carcinoma 5) Positive direct Coombs test - no evidence of overt hemolysis at this time.  6) Recently diagnosed high grade papillary uterine carcinoma . CT with no evidence of metastatic disease PLAN  -Pt will let me know if she begins feeling more dizzy or light headed or more fatigued -2mg  Dexamethasone for 10 days to help stimulate appetite -Discussed pt labwork today, 04/12/18; HGB improved to 8.1, blood counts and chemistries are otherwise stable -Begin taking PO 150mg  Iron Polysaccharide -Begin taking 2mg  dexamethasone -Pt has finished palliative radiation -Will see pt back with labs in 6 weeks -Repeat CT in 3-4 months -Optimize nutritional status and maintain activity as much as desired   5) . Patient Active Problem List   Diagnosis Date Noted  . Uterine carcinoma ([place]) 01/31/2018  . Syncope 09/28/2013  . Altered mental status 09/26/2013  . Syncope and collapse 09/26/2013  . Hypothyroidism 09/26/2013  . Depression 09/26/2013  . HTN (hypertension) 09/26/2013  Plan -continue f/u with PCP    RTC with Dr Irene Limbo with labs in 6 weeks  The total time spent in the  appointment was 20 minutes and more than 50% was on counseling and direct patient cares.     Sullivan Lone MD [place] [facility] Hematology/Oncology Physician [facility]  (Office):       [phone] (Work cell):  [phone] (Fax):           [phone]   I, Baldwin [place], am acting as a scribe for Dr Irene Limbo.   .I have reviewed the above documentation for accuracy and completeness, and I agree with the above. Brunetta Genera MD

## 2018-04-12 NOTE — Progress Notes (Signed)
Radiation Oncology         [phone] ________________________________  Name: Wendy [name] MRN: [id number]  Date: 04/12/2018  DOB: September 29, 1917  Follow-Up Visit Note  CC: Lajean Manes, MD  [name], MD    ICD-10-CM   1. 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 sHe continues to have problems with her appetite but has noticed more improvement in her strength. She denies any vaginal bleeding but does have some vaginal discharge. She denies any pelvic pain. Patient's daughter has noticed her to be more active.  She has minimal problems with diarrhea at this time.                          ALLERGIES:  has No Known Allergies.  Meds: Current Outpatient Medications  Medication Sig Dispense Refill  . aspirin EC 81 MG tablet Take 81 mg daily after supper by mouth.     . citalopram (CELEXA) 10 MG tablet Take 5 mg daily after supper by mouth.     . dexamethasone (DECADRON) 2 MG tablet Take 1 tablet (2 mg total) by mouth daily after breakfast. 10 tablet 0  . ENSURE (ENSURE) Take 237 mLs by mouth 2 (two) times daily between meals.    . hydrocortisone cream 1 % Apply to affected area 2 times daily 15 g 0  . iron polysaccharides (NIFEREX) 150 MG capsule Take 1 capsule (150 mg total) by mouth daily. 30 capsule 0  . levothyroxine (SYNTHROID, LEVOTHROID) 75 MCG tablet Take 75 mcg by mouth daily before breakfast.    . losartan (COZAAR) 50 MG tablet Take 50 mg by mouth daily.    Marland Kitchen triamcinolone (KENALOG) 0.025 % cream Apply 1 application 2 (two) times daily as needed topically (rash).    . Vitamin D, Ergocalciferol, (DRISDOL) 50000 units CAPS capsule Take 50,000 Units every 7 (seven) days by mouth.     No current facility-administered medications for this  encounter.     Physical Findings: The patient is in no acute distress. Patient is alert and oriented.  weight is 129 lb 12.8 oz (58.9 kg). Her temperature is 98 F (36.7 C). Her blood pressure is 132/56 (abnormal) and her pulse is 66. Her respiration is 20 and oxygen saturation is 100%. .  The lungs are clear. The heart has a regular rhythm and rate. The abdomen is soft and nontender with normal bowel sounds.  Patient had difficulty getting up on the examination table in light of her performance status. I attempted to do a speculum exam with a small speculum however the patient was too uncomfortable for this procedure. A limited digital exam revealed the nodules in the vaginal vault to be smaller. Evaluation of the cervix/uterus region was not possible in light of the patient's discomfort.  Lab Findings: Lab Results  Component Value Date   WBC 2.3 (L) 04/12/2018   HGB 8.1 (L) 04/12/2018   HCT 24.4 (L) 04/12/2018   MCV 96.9 04/12/2018   PLT 205 04/12/2018    Radiographic Findings: No results found.  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 Patient has recently completed 45 gray to the pelvis region and all areas of known disease. Limited exam today shows some shrinkage  along the disease in the vaginal vault. Given the patient's age and performance status and cofounding morbidities I do not feel she would be a candidate for uterine brachytherapy. this would require general anesthesia and placement of hyman capsules with the patient reclined for several hours and likely an overnight stay. I'm concerned that these procedures with significantly effect her overall performance status at age 84.  I discussed this in detail with the patient and her daughter. They are in agreement mid that she should not proceed with additional radiotherapy treatments.. We also talked about external boost directed at the vaginal/ uterine area but this would have limited value.  Plan:  Patient has  completed her radiation therapy. She will be scheduled for routine follow-up in one month. If she develops vaginal bleeding from her lesions within the vaginal vault she may be a potential candidate for a short course of palliative radiation therapy using a vaginal cylinder with brachytherapy. She'll be seen by medical oncology later today for evaluation of her borderline macrocytic anemia and leukopenia.  ____________________________________ Gery Pray, MD

## 2018-04-13 ENCOUNTER — Telehealth: Payer: Self-pay

## 2018-04-13 NOTE — Telephone Encounter (Signed)
Spoke to patient daughter concerning a upcoming appointment that was scheduled for her. Per 6/25 los will also mail a letter, and calender of these appointments for confirmation.

## 2018-04-26 ENCOUNTER — Telehealth: Payer: Self-pay | Admitting: Oncology

## 2018-04-26 NOTE — Telephone Encounter (Signed)
Hilda Blades called and asked about timing of patient's medications.  She is concerned about patient taking her synthroid with other medications.  Advised her to have her mother take synthroid in the morning and then wait about an hour before taking decadron.  She is also worried about the timing of patient's iron tablets.  Advised her that she can take them anytime during the day.  She also mentioned patient has been having stomach pains after eating "on and off."  She thinks this is due to constipation and will try giving her prune juice.

## 2018-05-02 ENCOUNTER — Ambulatory Visit: Payer: Medicare Other | Admitting: Podiatry

## 2018-05-05 ENCOUNTER — Telehealth: Payer: Self-pay | Admitting: Oncology

## 2018-05-05 ENCOUNTER — Ambulatory Visit
Admission: RE | Admit: 2018-05-05 | Discharge: 2018-05-05 | Disposition: A | Discharge status: Home / Self Care | Payer: Medicare Other | Source: Ambulatory Visit | Attending: Radiation Oncology | Admitting: Radiation Oncology

## 2018-05-05 NOTE — Telephone Encounter (Signed)
Patient's daughter left a message saying they will need to cancel the appointment with Dr. Sondra Come today because she is not feeling well.  They would like to reschedule.  Message left with Romie Jumper, Medical Secretary about cancellation.

## 2018-05-12 ENCOUNTER — Ambulatory Visit: Payer: Medicare Other | Admitting: Podiatry

## 2018-05-16 ENCOUNTER — Ambulatory Visit
Admission: RE | Admit: 2018-05-16 | Discharge: 2018-05-16 | Disposition: A | Discharge status: Home / Self Care | Payer: Medicare Other | Source: Ambulatory Visit | Attending: Radiation Oncology | Admitting: Radiation Oncology

## 2018-05-16 DIAGNOSIS — C55 Malignant neoplasm of uterus, part unspecified: Secondary | ICD-10-CM

## 2018-05-19 ENCOUNTER — Other Ambulatory Visit: Payer: Self-pay

## 2018-05-19 ENCOUNTER — Ambulatory Visit
Admission: RE | Admit: 2018-05-19 | Discharge: 2018-05-19 | Disposition: A | Discharge status: Home / Self Care | Payer: Medicare Other | Source: Ambulatory Visit | Attending: Radiation Oncology | Admitting: Radiation Oncology

## 2018-05-19 ENCOUNTER — Encounter: Payer: Self-pay | Admitting: Radiation Oncology

## 2018-05-19 VITALS — BP 153/65 | HR 61 | Temp 99.5°F | Resp 20 | Wt 130.6 lb

## 2018-05-19 DIAGNOSIS — C55 Malignant neoplasm of uterus, part unspecified: Secondary | ICD-10-CM

## 2018-05-19 DIAGNOSIS — K5900 Constipation, unspecified: Secondary | ICD-10-CM | POA: Diagnosis not present

## 2018-05-19 DIAGNOSIS — Z7982 Long term (current) use of aspirin: Secondary | ICD-10-CM | POA: Insufficient documentation

## 2018-05-19 DIAGNOSIS — Z79899 Other long term (current) drug therapy: Secondary | ICD-10-CM | POA: Diagnosis not present

## 2018-05-19 DIAGNOSIS — Z08 Encounter for follow-up examination after completed treatment for malignant neoplasm: Secondary | ICD-10-CM | POA: Diagnosis present

## 2018-05-19 NOTE — Progress Notes (Signed)
Wendy [name] is  Here for a  Follow-up appointment today. Patient denies any pain. States that her fatigue level is mild to moderate. Denies any dysuria. States that she is having urgency with urination.  Reports nocturia x1. Denies any vaginal or rectal bleeding. Denies any vaginal discharge. Denies any nausea or vomiting. States that she has some constipation.Denies any skin irritations .  Vitals:   05/19/18 1557  BP: (!) 153/65  Pulse: 61  Resp: 20  Temp: 99.5 F (37.5 C)  TempSrc: Oral  SpO2: 99%  Weight: 130 lb 9.6 oz (59.2 kg)   Wt Readings from Last 3 Encounters:  05/19/18 130 lb 9.6 oz (59.2 kg)  04/12/18 129 lb 12.8 oz (58.9 kg)  04/12/18 130 lb 8 oz (59.2 kg)

## 2018-05-19 NOTE — Progress Notes (Signed)
Radiation Oncology         [phone] ________________________________  Name: Wendy [name] MRN: [id number]  Date: 05/19/2018  DOB: 12-Apr-1917  Follow-Up Visit Note  CC: Wendy Manes, MD  Wendy Amber, MD    ICD-10-CM   1. Uterine carcinoma (HCC) C55     Diagnosis:   Stage III-B High Grade Serous Uterine Carcinoma  Interval Since Last Radiation:  7 weeks 02/21/18 - 03/31/18 Pelvis/ 3D/ 45 Gy in 25 fractions  Narrative:  The patient returns today for follow-up accompanied by her daughter. She presents feeling very good. She reports consistent constipation and continued issues with appetite. She denies rectal or vaginal bleeding, vaginal discharge, any pain, and nausea. Her daughter reports that Wendy [name] takes appetite pills and iron tablets. She also notes that while Wendy [name] takes short naps during the day, they try to remain active.                         ALLERGIES:  has No Known Allergies.  Meds: Current Outpatient Medications  Medication Sig Dispense Refill  . citalopram (CELEXA) 10 MG tablet Take 5 mg daily after supper by mouth.     . dexamethasone (DECADRON) 2 MG tablet Take 1 tablet (2 mg total) by mouth daily after breakfast. 10 tablet 0  . ENSURE (ENSURE) Take 237 mLs by mouth 2 (two) times daily between meals.    . hydrocortisone cream 1 % Apply to affected area 2 times daily 15 g 0  . Iron Combinations (NIFEREX) TABS TK 1 T PO D  0  . iron polysaccharides (NIFEREX) 150 MG capsule Take 1 capsule (150 mg total) by mouth daily. 30 capsule 0  . levothyroxine (SYNTHROID, LEVOTHROID) 75 MCG tablet Take 75 mcg by mouth daily before breakfast.    . losartan (COZAAR) 50 MG tablet Take 50 mg by mouth daily.    Marland Kitchen triamcinolone (KENALOG) 0.025 % cream Apply 1 application 2 (two) times daily as needed topically (rash).    . Vitamin D, Ergocalciferol, (DRISDOL) 50000 units CAPS capsule Take 50,000 Units every 7 (seven) days by mouth.    Marland Kitchen aspirin EC 81 MG tablet Take 81  mg daily after supper by mouth.      No current facility-administered medications for this encounter.     Physical Findings: The patient is in no acute distress. Patient is alert and oriented.  weight is 130 lb 9.6 oz (59.2 kg). Her oral temperature is 99.5 F (37.5 C). Her blood pressure is 153/65 (abnormal) and her pulse is 61. Her respiration is 20 and oxygen saturation is 99%. .  The lungs are clear. The heart has a regular rhythm and rate. The abdomen is soft and nontender with normal bowel sounds. Patient preferred not to have a pelvic exam.  Lab Findings: Lab Results  Component Value Date   WBC 2.3 (L) 04/12/2018   HGB 8.1 (L) 04/12/2018   HCT 24.4 (L) 04/12/2018   MCV 96.9 04/12/2018   PLT 205 04/12/2018    Radiographic Findings: No results found.  Impression:  Stage III-B High Grade Serous Uterine Carcinoma  The patient continues to show improvement in her energy. Her appetite has improved with the use of decadron. The patient denies any pelvic pain or bleeding at this time.  Plan:  She will be scheduled for routine follow-up in three months. Her daughter will call for an earlier appointment if she develops any vaginal bleeding.  ___________________________________ -----------------------------------  Blair Promise, PhD, MD  This document serves as a record of services personally performed by Gery Pray, MD. It was created on his behalf by Wilburn Mylar, a trained medical scribe. The creation of this record is based on the scribe's personal observations and the provider's statements to them. This document has been checked and approved by the attending provider.

## 2018-05-23 NOTE — Progress Notes (Signed)
HEMATOLOGY ONCOLOGY CLINIC NOTE  Date of Service: 05/24/18   Patient Care Team: Lajean Manes, MD as PCP - General (Internal Medicine)  CHIEF COMPLAINTS/PURPOSE OF CONSULTATION:  Anemia F/u for recently diagnosed uterine papillary carcinoma.  HISTORY OF PRESENTING ILLNESS:   Wendy [name] 84 y.o. female is here because of worsening anemia.  Patient has a history of hypertension and hypothyroidism who as per her daughter has been a very active person until recently. Her daughter notes that patient's levothyroxine dose was reduced from 75 mcg to 50 mcg daily and this produced a significant change in the patient's cognition and energy levels.  She reports that it is very unusual for the patient to wake up late and then sleep throughout the day as she is currently doing. Per the patient's daughter she was a very active person, who would do a lot of walking, including walking around the track.   She notes that the patient has been slowly declining over the past year ever since seeing a new provider who started decreasing her thyroid medication. The patient's appetite has been declining as well.    She was seen in the ED for two syncopal episodes. On 09/04/2018 .  She was noted to be hypotensive and hypoxic.  Symptoms were thought to be related to dehydration due to poor oral intake.  Echo showed normal ejection fraction of 60-65% with grade 2 diastolic dysfunction.  Her losartan dose was significantly reduced on discharge.  She was noted to have several episodes of nonsustained VT but her heart rate was in the 50s.  Hemoglobin was noted to be between 7 and 7.5 but transfusion was withheld due to presence of positive Coombs test and outpatient hematology consult was recommended. FOBT was never sent.  She was found to have abnormal CBC from 09/07/2017 -with hemoglobin of 7 MCV of 95, WBC count of 3k with an ANC of 2.3k.  And a platelet count of 175k.  Reticulocyte percentage of 1.9,  absolute reticulocyte count of 45.8k/ul .  She was noted to have a positive Coombs test but her LDH and haptoglobin levels were within normal limits suggesting against overt hemolysis.  ANA negative. Sedimentation rate 138.  CRP within normal limits. Free T4 0.87 Ferritin 857 with 23% iron saturation. Folate 19.5 B12 - 470  She denies recent chest pain on exertion, shortness of breath on minimal exertion, or palpitations. She denies cough. She had not noticed any recent bleeding such as epistaxis, hematuria or hematochezia.   The patient denies over the counter NSAID ingestion. She is not on antiplatelets agents.   She had no prior history or diagnosis of cancer. Her age appropriate screening programs are up-to-date.  She denies any pica and eats a variety of diet.  She never donated blood or received blood transfusion.   Some days her appetite is better than others. The patients daughter states she has also become more forgetful at times.  Pt denies fever, chills, trouble swelling, abdominal pain, or any other symptoms or complaints at this time.  Patient really knows that she still feels somewhat fatigued but has no overt lightheadedness or dizziness at this time and feels better than when she was admitted. No overt infectious issues. No overt evidence of bleeding.  Some unquantified weight loss due to decreased appetite. No other overt focal symptoms.  Interval History:   Wendy [name] is here regarding her borderline macrocytic anemia  and leucopenia and her newly diagnosed uterine high grade papillary caricnoma. The  patient's last visit with [place] was on 04/12/18. She is accompanied today by her daughter. The pt reports that she is doing well overall.   The pt reports that she hasn't had any new concerns in the interim. She has been taking 2mg  Dexamethasone for successful appetite stimulation and has been drinking about two Ensures each day in addition to 2 meals a day. She notes that  her vaginal bleeding has stopped. She notes that she hasn't been walking quite as much with slightly more fatigue but continues to enjoy life.   Lab results today (05/24/18) of CBC w/diff, CMP is as follows: all values are WNL except for WBC at 2.6k, RBC at 2.97, HGB at 9.5, HCT at 28.7, RDW at 18.0, Lymphs abs at 400, Creatinine at 1.06, GFR at 49. LDH 05/24/18 is elevated at 228 Iron/TIBC showed all values WNL except for Iron at 38, TIBC at 231, 16% Saturation ratio. Ferritin 05/24/18 is at 1088.  On review of systems, pt reports resolved vaginal bleeding, some fatigue and sleepiness, and denies problems moving her bowels, urinary discomfort, abdominal pains, back pain, leg pain/swelling, and any other symptoms.    MEDICAL HISTORY:  Past Medical History:  Diagnosis Date  . Hypertension   . Thyroid disease   Nonsustained VT Hypothyroidism Acute kidney injury 08/2017 Depression-on Celexa.  SURGICAL HISTORY: Past Surgical History:  Procedure Laterality Date  . CORONARY ANGIOPLASTY      SOCIAL HISTORY: Social History   Socioeconomic History  . Marital status: Widowed    Spouse name: Not on file  . Number of children: Not on file  . Years of education: Not on file  . Highest education level: Not on file  Occupational History  . Not on file  Social Needs  . Financial resource strain: Not on file  . Food insecurity:    Worry: Not on file    Inability: Not on file  . Transportation needs:    Medical: Not on file    Non-medical: Not on file  Tobacco Use  . Smoking status: Never Smoker  . Smokeless tobacco: Never Used  Substance and Sexual Activity  . Alcohol use: No  . Drug use: No  . Sexual activity: Never  Lifestyle  . Physical activity:    Days per week: Not on file    Minutes per session: Not on file  . Stress: Not on file  Relationships  . Social connections:    Talks on phone: Not on file    Gets together: Not on file    Attends religious service: Not on file     Active member of club or organization: Not on file    Attends meetings of clubs or organizations: Not on file    Relationship status: Not on file  . Intimate partner violence:    Fear of current or ex partner: Not on file    Emotionally abused: Not on file    Physically abused: Not on file    Forced sexual activity: Not on file  Other Topics Concern  . Not on file  Social History Narrative  . Not on file    FAMILY HISTORY: No family history on file.  ALLERGIES:  has No Known Allergies.  MEDICATIONS:  Current Outpatient Medications  Medication Sig Dispense Refill  . aspirin EC 81 MG tablet Take 81 mg daily after supper by mouth.     . citalopram (CELEXA) 10 MG tablet Take 5 mg daily after supper by mouth.     Marland Kitchen  dexamethasone (DECADRON) 2 MG tablet Take 1 tablet (2 mg total) by mouth daily after breakfast. 30 tablet 1  . ENSURE (ENSURE) Take 237 mLs by mouth 2 (two) times daily between meals.    . hydrocortisone cream 1 % Apply to affected area 2 times daily 15 g 0  . Iron Combinations (NIFEREX) TABS TK 1 T PO D  0  . iron polysaccharides (NIFEREX) 150 MG capsule Take 1 capsule (150 mg total) by mouth daily. 30 capsule 0  . levothyroxine (SYNTHROID, LEVOTHROID) 75 MCG tablet Take 75 mcg by mouth daily before breakfast.    . losartan (COZAAR) 50 MG tablet Take 50 mg by mouth daily.    Marland Kitchen triamcinolone (KENALOG) 0.025 % cream Apply 1 application 2 (two) times daily as needed topically (rash).    . Vitamin D, Ergocalciferol, (DRISDOL) 50000 units CAPS capsule Take 50,000 Units every 7 (seven) days by mouth.     No current facility-administered medications for this visit.     REVIEW OF SYSTEMS:    A 10+ POINT REVIEW OF SYSTEMS WAS OBTAINED including neurology, dermatology, psychiatry, cardiac, respiratory, lymph, extremities, GI, GU, Musculoskeletal, constitutional, breasts, reproductive, HEENT.  All pertinent positives are noted in the HPI.  All others are negative.   PHYSICAL  EXAMINATION: ECOG PERFORMANCE STATUS: 2 - Symptomatic, <50% confined to bed  Vitals:   05/24/18 1330  BP: (!) 151/67  Pulse: 69  Resp: 17  Temp: 98.5 F (36.9 C)  SpO2: 100%   Filed Weights   05/24/18 1330  Weight: 128 lb 14.4 oz (58.5 kg)     GENERAL:alert, in no acute distress and comfortable SKIN: no acute rashes, no significant lesions EYES: conjunctiva are pink and non-injected, sclera anicteric OROPHARYNX: MMM, no exudates, no oropharyngeal erythema or ulceration NECK: supple, no JVD LYMPH:  no palpable lymphadenopathy in the cervical, axillary or inguinal regions LUNGS: clear to auscultation b/l with normal respiratory effort HEART: regular rate & rhythm ABDOMEN:  normoactive bowel sounds , non tender, not distended. No palpable hepatosplenomegaly.  Extremity: no pedal edema PSYCH: alert & oriented x 3 with fluent speech NEURO: no focal motor/sensory deficits   LABORATORY DATA:  I have reviewed the data as listed  . CBC Latest Ref Rng & Units 05/24/2018 04/12/2018 03/21/2018  WBC 3.9 - 10.3 K/uL 2.6(L) 2.3(L) 3.0(L)  Hemoglobin 11.6 - 15.9 g/dL 9.5(L) 8.1(L) 7.1(L)  Hematocrit 34.8 - 46.6 % 28.7(L) 24.4(L) 21.7(L)  Platelets 145 - 400 K/uL 199 205 208   . CMP Latest Ref Rng & Units 05/24/2018 04/12/2018 03/21/2018  Glucose 70 - 99 mg/dL 87 87 94  BUN 8 - 23 mg/dL 13 15 14   Creatinine 0.44 - 1.00 mg/dL 1.06(H) 1.06(H) 1.04  Sodium 135 - 145 mmol/L 140 141 139  Potassium 3.5 - 5.1 mmol/L 4.2 4.0 3.9  Chloride 98 - 111 mmol/L 106 107 107  CO2 22 - 32 mmol/L 25 25 25   Calcium 8.9 - 10.3 mg/dL 9.1 9.6 9.2  Total Protein 6.5 - 8.1 g/dL 8.0 8.3(H) 8.4(H)  Total Bilirubin 0.3 - 1.2 mg/dL 0.6 0.7 0.7  Alkaline Phos 38 - 126 U/L 61 61 63  AST 15 - 41 U/L 18 20 24   ALT 0 - 44 U/L 10 8 <6        RADIOGRAPHIC STUDIES: I have personally reviewed the radiological images as listed and agreed with the findings in the report. No results found.  ASSESSMENT & PLAN:   84  y.o. female  1) Normocytic Anemia likely multifactorial  hgb improved from 8.1 to 9.5 Likely MDS + blood loss from uterine high grade papillary uterine carcinoma with vaginal bleeding + Anemia of chronic disease related to newly diagnosed malignancy.  Slightly elevated LDH level at 297 but normal haptoglobin suggesting against acute hemolysis.   Homocystine is elevated though B12 level is within normal limits and RBC folate- WNL  10/28/17 MM panel showed no M-Protein, with elevated IgG and IgM. Kappa and lambda light chain were both elevated- likely reactive to newly diagnosed malignancy. No overt evidence of GI bleeding.  No evidence of iron deficiency. Vaginal bleeding has resolved for now Cannot rule out MDS given the patient's advanced age.  2) leukopenia 2.3k with mild neutropenia 1.5k  3)hypothyroidism  -on levothyroxine replacement on 70mcg po daily.  4) Elevated LDH levels -no overt evidence of hemolysis given normal haptoglobin level and no reticulocytosis and normal bilirubin levels.  Likely from uterine papillary carcinoma 5) Positive direct Coombs test - no evidence of overt hemolysis at this time.  6) Recently diagnosed high grade papillary uterine carcinoma . S/p completion of palliative RT. Deemed not a good candidate for chemotherapy. Currently with resolution of vaginal bleeding. Initial CT with no evidence of metastatic disease PLAN  -Pt has finished palliative radiation with resolution of vaginal bleeding. -Discussed pt labwork today, 05/24/18; HGB has increased to 9.5. Ferritin at 1088 -Discontinue PO Iron replacement - given elevated ferritin levels -Continue using Ensure and 2mg  Dexamethasone for appetite stimulation -Will see the pt back in 8-10 weeks unless the pt develops any new concerns, and will repeat imaging the following visit   5) . Patient Active Problem List   Diagnosis Date Noted  . Uterine carcinoma ([place]) 01/31/2018  . Syncope 09/28/2013  .  Altered mental status 09/26/2013  . Syncope and collapse 09/26/2013  . Hypothyroidism 09/26/2013  . Depression 09/26/2013  . HTN (hypertension) 09/26/2013  Plan -continue f/u with PCP   RTC with Dr Irene Limbo with labs in 10 weeks   The total time spent in the appt was 25 minutes and more than 50% was on counseling and direct patient cares.   Sullivan Lone MD MS AAHIVMS [facility] Hematology/Oncology Physician [facility]  (Office):       [phone] (Work cell):  [phone] (Fax):           [phone]   I, Baldwin [place], am acting as a scribe for [name]  .I have reviewed the above documentation for accuracy and completeness, and I agree with the above. Brunetta Genera MD

## 2018-05-24 ENCOUNTER — Telehealth: Payer: Self-pay | Admitting: Hematology

## 2018-05-24 ENCOUNTER — Inpatient Hospital Stay: Payer: Medicare Other | Attending: Hematology | Admitting: Hematology

## 2018-05-24 ENCOUNTER — Inpatient Hospital Stay: Payer: Medicare Other

## 2018-05-24 VITALS — BP 151/67 | HR 69 | Temp 98.5°F | Resp 17 | Ht 62.0 in | Wt 128.9 lb

## 2018-05-24 DIAGNOSIS — C55 Malignant neoplasm of uterus, part unspecified: Secondary | ICD-10-CM | POA: Diagnosis not present

## 2018-05-24 DIAGNOSIS — R1012 Left upper quadrant pain: Secondary | ICD-10-CM | POA: Diagnosis not present

## 2018-05-24 DIAGNOSIS — D649 Anemia, unspecified: Secondary | ICD-10-CM

## 2018-05-24 DIAGNOSIS — E039 Hypothyroidism, unspecified: Secondary | ICD-10-CM

## 2018-05-24 DIAGNOSIS — R740 Nonspecific elevation of levels of transaminase and lactic acid dehydrogenase [LDH]: Secondary | ICD-10-CM | POA: Insufficient documentation

## 2018-05-24 DIAGNOSIS — R7989 Other specified abnormal findings of blood chemistry: Secondary | ICD-10-CM | POA: Diagnosis not present

## 2018-05-24 DIAGNOSIS — D72819 Decreased white blood cell count, unspecified: Secondary | ICD-10-CM | POA: Insufficient documentation

## 2018-05-24 DIAGNOSIS — D500 Iron deficiency anemia secondary to blood loss (chronic): Secondary | ICD-10-CM

## 2018-05-24 DIAGNOSIS — D709 Neutropenia, unspecified: Secondary | ICD-10-CM

## 2018-05-24 DIAGNOSIS — R768 Other specified abnormal immunological findings in serum: Secondary | ICD-10-CM | POA: Diagnosis not present

## 2018-05-24 DIAGNOSIS — Z923 Personal history of irradiation: Secondary | ICD-10-CM | POA: Diagnosis not present

## 2018-05-24 DIAGNOSIS — Z79899 Other long term (current) drug therapy: Secondary | ICD-10-CM | POA: Insufficient documentation

## 2018-05-24 LAB — CBC WITH DIFFERENTIAL/PLATELET
BASOS ABS: 0 10*3/uL (ref 0.0–0.1)
BASOS PCT: 1 %
EOS PCT: 2 %
Eosinophils Absolute: 0 10*3/uL (ref 0.0–0.5)
HCT: 28.7 % — ABNORMAL LOW (ref 34.8–46.6)
Hemoglobin: 9.5 g/dL — ABNORMAL LOW (ref 11.6–15.9)
Lymphocytes Relative: 14 %
Lymphs Abs: 0.4 10*3/uL — ABNORMAL LOW (ref 0.9–3.3)
MCH: 32.1 pg (ref 25.1–34.0)
MCHC: 33.3 g/dL (ref 31.5–36.0)
MCV: 96.6 fL (ref 79.5–101.0)
Monocytes Absolute: 0.4 10*3/uL (ref 0.1–0.9)
Monocytes Relative: 15 %
Neutro Abs: 1.7 10*3/uL (ref 1.5–6.5)
Neutrophils Relative %: 68 %
PLATELETS: 199 10*3/uL (ref 145–400)
RBC: 2.97 MIL/uL — ABNORMAL LOW (ref 3.70–5.45)
RDW: 18 % — AB (ref 11.2–14.5)
WBC: 2.6 10*3/uL — ABNORMAL LOW (ref 3.9–10.3)

## 2018-05-24 LAB — CMP (CANCER CENTER ONLY)
ALBUMIN: 3.7 g/dL (ref 3.5–5.0)
ALK PHOS: 61 U/L (ref 38–126)
ALT: 10 U/L (ref 0–44)
AST: 18 U/L (ref 15–41)
Anion gap: 9 (ref 5–15)
BUN: 13 mg/dL (ref 8–23)
CALCIUM: 9.1 mg/dL (ref 8.9–10.3)
CO2: 25 mmol/L (ref 22–32)
CREATININE: 1.06 mg/dL — AB (ref 0.44–1.00)
Chloride: 106 mmol/L (ref 98–111)
GFR, Est AFR Am: 49 mL/min — ABNORMAL LOW (ref >60)
GFR, Estimated: 42 mL/min — ABNORMAL LOW (ref >60)
GLUCOSE: 87 mg/dL (ref 70–99)
Potassium: 4.2 mmol/L (ref 3.5–5.1)
Sodium: 140 mmol/L (ref 135–145)
TOTAL PROTEIN: 8 g/dL (ref 6.5–8.1)
Total Bilirubin: 0.6 mg/dL (ref 0.3–1.2)

## 2018-05-24 LAB — LACTATE DEHYDROGENASE: LDH: 228 U/L — AB (ref 98–192)

## 2018-05-24 LAB — IRON AND TIBC
Iron: 38 ug/dL — ABNORMAL LOW (ref 41–142)
Saturation Ratios: 16 % — ABNORMAL LOW (ref 21–57)
TIBC: 231 ug/dL — ABNORMAL LOW (ref 236–444)
UIBC: 194 ug/dL

## 2018-05-24 LAB — FERRITIN: Ferritin: 1088 ng/mL — ABNORMAL HIGH (ref 11–307)

## 2018-05-24 MED ORDER — DEXAMETHASONE 2 MG PO TABS: 2.0000 mg | ORAL_TABLET | Freq: Every day | ORAL | 1 refills | Status: DC

## 2018-05-24 NOTE — Telephone Encounter (Signed)
Appointments scheduled AVS/Calendar printed per 8/6 los °

## 2018-05-27 ENCOUNTER — Other Ambulatory Visit: Payer: Self-pay | Admitting: Hematology

## 2018-05-30 ENCOUNTER — Telehealth: Payer: Self-pay | Admitting: Oncology

## 2018-05-30 NOTE — Telephone Encounter (Signed)
Patient's daughter, Hilda Blades, called and wanted to report that her mother had stomach pains on and off over the weekend.  She did not have any diarrhea or vomiting. Hilda Blades is also concerned because her mother is sleeping latter and latter (now around 1:30 pm depending on how late she stays up at night).  She also stays in her bathrobe all day and does not have the energy to get dressed.  Hilda Blades is worried about the timing of her medications, decadron and iron since patient is only eating one good meal a day.  Discussed that the fatigue may be from radiation and that Dr. Sondra Come and Dr. Grier Mitts nurse will be notified and that we will call her back.

## 2018-05-31 ENCOUNTER — Telehealth: Payer: Self-pay | Admitting: Oncology

## 2018-05-31 NOTE — Telephone Encounter (Signed)
Called Wendy [name] back and advised her that her mother should stop taking iron per [name] and that she can take over the counter B12 if she would like.  She verbalized understanding and agreement.

## 2018-06-02 ENCOUNTER — Telehealth: Payer: Self-pay | Admitting: Oncology

## 2018-06-02 ENCOUNTER — Inpatient Hospital Stay: Payer: Medicare Other

## 2018-06-02 ENCOUNTER — Telehealth: Payer: Self-pay | Admitting: Medical

## 2018-06-02 ENCOUNTER — Inpatient Hospital Stay (HOSPITAL_BASED_OUTPATIENT_CLINIC_OR_DEPARTMENT_OTHER): Payer: Medicare Other | Admitting: Medical

## 2018-06-02 ENCOUNTER — Encounter: Payer: Self-pay | Admitting: Oncology

## 2018-06-02 ENCOUNTER — Other Ambulatory Visit: Payer: Self-pay | Admitting: Emergency Medicine

## 2018-06-02 VITALS — BP 147/72 | HR 69 | Temp 98.7°F | Resp 18 | Ht 62.0 in | Wt 127.5 lb

## 2018-06-02 DIAGNOSIS — D649 Anemia, unspecified: Secondary | ICD-10-CM | POA: Diagnosis not present

## 2018-06-02 DIAGNOSIS — R1012 Left upper quadrant pain: Secondary | ICD-10-CM | POA: Diagnosis not present

## 2018-06-02 DIAGNOSIS — Z923 Personal history of irradiation: Secondary | ICD-10-CM | POA: Diagnosis not present

## 2018-06-02 DIAGNOSIS — C55 Malignant neoplasm of uterus, part unspecified: Secondary | ICD-10-CM

## 2018-06-02 DIAGNOSIS — R109 Unspecified abdominal pain: Secondary | ICD-10-CM

## 2018-06-02 LAB — CMP (CANCER CENTER ONLY)
ALBUMIN: 3.7 g/dL (ref 3.5–5.0)
ALT: 6 U/L (ref 0–44)
AST: 16 U/L (ref 15–41)
Alkaline Phosphatase: 54 U/L (ref 38–126)
Anion gap: 9 (ref 5–15)
BUN: 15 mg/dL (ref 8–23)
CHLORIDE: 106 mmol/L (ref 98–111)
CO2: 25 mmol/L (ref 22–32)
CREATININE: 1 mg/dL (ref 0.44–1.00)
Calcium: 9.1 mg/dL (ref 8.9–10.3)
GFR, EST NON AFRICAN AMERICAN: 45 mL/min — AB (ref >60)
GFR, Est AFR Am: 52 mL/min — ABNORMAL LOW (ref >60)
GLUCOSE: 85 mg/dL (ref 70–99)
Potassium: 4 mmol/L (ref 3.5–5.1)
Sodium: 140 mmol/L (ref 135–145)
Total Bilirubin: 0.6 mg/dL (ref 0.3–1.2)
Total Protein: 8 g/dL (ref 6.5–8.1)

## 2018-06-02 LAB — CBC WITH DIFFERENTIAL (CANCER CENTER ONLY)
Basophils Absolute: 0 10*3/uL (ref 0.0–0.1)
Basophils Relative: 0 %
EOS ABS: 0 10*3/uL (ref 0.0–0.5)
EOS PCT: 2 %
HCT: 21.8 % — ABNORMAL LOW (ref 34.8–46.6)
Hemoglobin: 7.3 g/dL — ABNORMAL LOW (ref 11.6–15.9)
LYMPHS ABS: 0.5 10*3/uL — AB (ref 0.9–3.3)
LYMPHS PCT: 19 %
MCH: 32.3 pg (ref 25.1–34.0)
MCHC: 33.5 g/dL (ref 31.5–36.0)
MCV: 96.5 fL (ref 79.5–101.0)
MONO ABS: 0.4 10*3/uL (ref 0.1–0.9)
MONOS PCT: 15 %
Neutro Abs: 1.7 10*3/uL (ref 1.5–6.5)
Neutrophils Relative %: 64 %
PLATELETS: 204 10*3/uL (ref 145–400)
RBC: 2.26 MIL/uL — ABNORMAL LOW (ref 3.70–5.45)
RDW: 16 % — AB (ref 11.2–14.5)
WBC: 2.7 10*3/uL — AB (ref 3.9–10.3)

## 2018-06-02 LAB — AMYLASE: AMYLASE: 97 U/L (ref 28–100)

## 2018-06-02 LAB — LIPASE, BLOOD: LIPASE: 51 U/L (ref 11–51)

## 2018-06-02 MED ORDER — PANTOPRAZOLE SODIUM 40 MG PO TBEC: 40.0000 mg | DELAYED_RELEASE_TABLET | Freq: Every day | ORAL | 5 refills | Status: DC

## 2018-06-02 NOTE — Progress Notes (Signed)
Pt seen by PA Lucianne Lei only, no RN assessment performed at this time.  PA Lucianne Lei aware.

## 2018-06-02 NOTE — Telephone Encounter (Signed)
Scheduled appt per 8/15 los - gave patient AVS and calender per los.  

## 2018-06-02 NOTE — Patient Instructions (Signed)
Anemia Anemia is a condition in which you do not have enough red blood cells or hemoglobin. Hemoglobin is a substance in red blood cells that carries oxygen. When you do not have enough red blood cells or hemoglobin (are anemic), your body cannot get enough oxygen and your organs may not work properly. As a result, you may feel very tired or have other problems. What are the causes? Common causes of anemia include:  Excessive bleeding. Anemia can be caused by excessive bleeding inside or outside the body, including bleeding from the intestine or from periods in women.  Poor nutrition.  Long-lasting (chronic) kidney, thyroid, and liver disease.  Bone marrow disorders.  Cancer and treatments for cancer.  HIV (human immunodeficiency virus) and AIDS (acquired immunodeficiency syndrome).  Treatments for HIV and AIDS.  Spleen problems.  Blood disorders.  Infections, medicines, and autoimmune disorders that destroy red blood cells.  What are the signs or symptoms? Symptoms of this condition include:  Minor weakness.  Dizziness.  Headache.  Feeling heartbeats that are irregular or faster than normal (palpitations).  Shortness of breath, especially with exercise.  Paleness.  Cold sensitivity.  Indigestion.  Nausea.  Difficulty sleeping.  Difficulty concentrating.  Symptoms may occur suddenly or develop slowly. If your anemia is mild, you may not have symptoms. How is this diagnosed? This condition is diagnosed based on:  Blood tests.  Your medical history.  A physical exam.  Bone marrow biopsy.  Your health care provider may also check your stool (feces) for blood and may do additional testing to look for the cause of your bleeding. You may also have other tests, including:  Imaging tests, such as a CT scan or MRI.  Endoscopy.  Colonoscopy.  How is this treated? Treatment for this condition depends on the cause. If you continue to lose a lot of blood,  you may need to be treated at a hospital. Treatment may include:  Taking supplements of iron, vitamin B12, or folic acid.  Taking a hormone medicine (erythropoietin) that can help to stimulate red blood cell growth.  Having a blood transfusion. This may be needed if you lose a lot of blood.  Making changes to your diet.  Having surgery to remove your spleen.  Follow these instructions at home:  Take over-the-counter and prescription medicines only as told by your health care provider.  Take supplements only as told by your health care provider.  Follow any diet instructions that you were given.  Keep all follow-up visits as told by your health care provider. This is important. Contact a health care provider if:  You develop new bleeding anywhere in the body. Get help right away if:  You are very weak.  You are short of breath.  You have pain in your abdomen or chest.  You are dizzy or feel faint.  You have trouble concentrating.  You have bloody or black, tarry stools.  You vomit repeatedly or you vomit up blood. Summary  Anemia is a condition in which you do not have enough red blood cells or enough of a substance in your red blood cells that carries oxygen (hemoglobin).  Symptoms may occur suddenly or develop slowly.  If your anemia is mild, you may not have symptoms.  This condition is diagnosed with blood tests as well as a medical history and physical exam. Other tests may be needed.  Treatment for this condition depends on the cause of the anemia. This information is not intended to replace advice   given to you by your health care provider. Make sure you discuss any questions you have with your health care provider. Document Released: 11/12/2004 Document Revised: 11/06/2016 Document Reviewed: 11/06/2016 Elsevier Interactive Patient Education  [employer].

## 2018-06-02 NOTE — Telephone Encounter (Signed)
Called [name] back about the stomach pains.  She said her mother has not been vomiting and does not know if she has had constipation/diarrhea.  Asked if they would like to see Sandi Mealy, PA today.  Appointment scheduled at 2 pm for labs, 2:30 pm for symptom management.  [name] verbalized understanding and agreement.

## 2018-06-02 NOTE — Telephone Encounter (Signed)
[name] left a message stating that patient had stomach pain and felt terrible yesterday afternoon.  She also had pains like this over the weekend.

## 2018-06-03 NOTE — Progress Notes (Addendum)
[facility] Progress Note   Wendy [name] [id number] 1916/12/29 84 y.o.  Glorious Peach is managed by Dr. Sullivan Lone  Actively treated with chemotherapy/immunotherapy: no   Assessment: Plan:    Anemia, unspecified type - Plan: CBC with Differential ([place] Only), Draw extra clot specimen  Uterine carcinoma ([place])  Stomach discomfort   Anemia: A CBC returned today with a hemoglobin of 7.3 and a hematocrit of 21.8.  The patient had a CBC completed 9 days ago at which time her hemoglobin was 9.5 and her hematocrit was 28.7.  She and her daughter deny any evidence of bleeding.  I have recommended to the patient that she have a transfusion of 2 units of packed red blood cells tomorrow.  The patient's daughter is hesitant to proceed with this recommendation.  I have explained to her that her mother could develop an arrhythmia based on the rapid progression of her anemia.  I explained to her that this could be life-threatening.  I also explained to her that her mother could become progressively fatigued and is at risk for falls.  I explained to her that 50% of patients who fractured her hip either die within the first year or are unable to return home.  Despite this information, the patient's daughter has elected to have the patient return on Monday, 06/06/2018 for a repeat CBC at which time they would make a decision regarding a transfusion.  Uterine cancer: The patient is status post radiation therapy.  The patient's daughter states that she cannot understand why Dr. Sullivan Lone did not treat the patient with chemotherapy.  She reports that she wanted her mother treated aggressively so that she could be cured of this cancer.  I have attempted to explain to her that in all likelihood, Dr. Sullivan Lone did not recommend chemotherapy based on the fact that the patient is 84 years of age.  Dr. Sullivan Lone stated that he declined recommending chemotherapy given that the  patient likely has MDS.  Stomach discomfort: The patient's daughter states that the patient has had an ulcer in the past.  She is had some left upper quadrant abdominal pain after meals over the last week.  I have prescribed Protonix 40 mg once daily for the patient.  The patient's daughter is concerned that Protonix will suppress the acid in the patient's stomach.  She does not believe this is a good thing.  Please see After Visit Summary for patient specific instructions.  Future Appointments  Date Time Provider English  06/06/2018 12:30 PM [facility] 6 [facility] None  06/06/2018  1:00 PM Tanner, Lucianne Lei E., PA-C [facility] None  08/02/2018  1:15 PM [facility] 4 [facility] None  08/02/2018  1:40 PM Brunetta Genera, MD [facility] None    Orders Placed This Encounter  Procedures  . CBC with Differential ([place] Only)  . Draw extra clot specimen       Subjective:   Patient ID:  Wendy [name] is a 84 y.o. (DOB 1917/02/21) female.  Chief Complaint:  Chief Complaint  Patient presents with  . Fatigue    HPI YARIELYS BEED is a 84 year old female with a history of a uterine cancer who is followed by [name].  The patient has been treated with radiation therapy.  She presents to clinic today with her daughter.   The patient has been having episodic left upper quadrant abdominal pain which has been occurring after eating over the past week.  She is also having fatigue.  A complete blood count was done today which returned showing hemoglobin of 7.3 which is down from 9.5 when last done 9 days ago.  The patient and her daughter deny any evidence of bleeding.  There was recommended that the patient have a transfusion given the drop in her hemoglobin.  The patient's daughter states that her mother is never had a transfusion and has declined these in the past.  She also notes that her hemoglobin increased from 8.1 when completed 1 month ago to 9.5 when  completed 9 days ago despite not having a transfusion.  The patient's daughter is also concerned that the patient has had her oral iron held for the last 9 days after a ferritin level returned greater than 1000.  She is hesitant for her mother to have a transfusion.  When asked what the potential side effects of a transfusion could be it was told to the patient's and her daughter that rarely patients can have a transfusion reaction.  There was also told that the risk of developing hepatitis was 1 in around 500,000 and that the risk of HIV was around 1 in 1,000,000 to 2,000,000.  The patient's daughter is concerned regarding the statistics.  The patient's daughter also expresses concern that her mother did not receive chemotherapy and only received radiation therapy.  She reports that she wanted her mother to be cured of this cancer and believes that chemotherapy in additional radiation therapy would be the best treatment for her.  She reports that she cannot understand why the patient was not treated but does recall that Dr. Sullivan Lone said that her blood counts were too low.  When the patient's age of 84 years was addressed with the patient's daughter she simply responded that that was just a number.  Medications: I have reviewed the patient's current medications.  Allergies: No Known Allergies  Past Medical History:  Diagnosis Date  . History of radiation therapy 02/21/2018-03/31/2018   Uterus, pelvis1.8 Gy in 25 fractions for a total dose of 45 Gy     . Hypertension   . Thyroid disease     Past Surgical History:  Procedure Laterality Date  . CORONARY ANGIOPLASTY      No family history on file.  Social History   Socioeconomic History  . Marital status: Widowed    Spouse name: Not on file  . Number of children: Not on file  . Years of education: Not on file  . Highest education level: Not on file  Occupational History  . Not on file  Social Needs  . Financial resource strain: Not on  file  . Food insecurity:    Worry: Not on file    Inability: Not on file  . Transportation needs:    Medical: Not on file    Non-medical: Not on file  Tobacco Use  . Smoking status: Never Smoker  . Smokeless tobacco: Never Used  Substance and Sexual Activity  . Alcohol use: No  . Drug use: No  . Sexual activity: Never  Lifestyle  . Physical activity:    Days per week: Not on file    Minutes per session: Not on file  . Stress: Not on file  Relationships  . Social connections:    Talks on phone: Not on file    Gets together: Not on file    Attends religious service: Not on file    Active member of club or organization: Not on file  Attends meetings of clubs or organizations: Not on file    Relationship status: Not on file  . Intimate partner violence:    Fear of current or ex partner: Not on file    Emotionally abused: Not on file    Physically abused: Not on file    Forced sexual activity: Not on file  Other Topics Concern  . Not on file  Social History Narrative  . Not on file    Past Medical History, Surgical history, Social history, and Family history were reviewed and updated as appropriate.   Please see review of systems for further details on the patient's review from today.   Review of Systems:  Review of Systems  Constitutional: Positive for fatigue. Negative for appetite change, chills, diaphoresis and fever.  HENT: Negative for dental problem, mouth sores and trouble swallowing.   Respiratory: Negative for cough, chest tightness and shortness of breath.   Cardiovascular: Negative for chest pain and palpitations.  Gastrointestinal: Negative for constipation, diarrhea, nausea and vomiting.  Neurological: Negative for dizziness, syncope, weakness and headaches.    Objective:   Physical Exam:  BP (!) 147/72 (BP Location: Left Arm, Patient Position: Sitting)   Pulse 69   Temp 98.7 F (37.1 C) (Oral)   Resp 18   Ht 5\' 2"  (1.575 m)   Wt 127 lb 8 oz  (57.8 kg)   SpO2 100%   BMI 23.32 kg/m  ECOG: 1  Physical Exam  Constitutional: No distress.  HENT:  Head: Normocephalic and atraumatic.  Cardiovascular: Normal rate, regular rhythm and normal heart sounds. Exam reveals no gallop and no friction rub.  No murmur heard. Pulmonary/Chest: Effort normal and breath sounds normal. No respiratory distress. She has no wheezes. She has no rales.  Neurological: She is alert.  Skin: Skin is warm and dry. No rash noted. She is not diaphoretic. No erythema.    Lab Review:     Component Value Date/Time   NA 140 06/02/2018 1358   K 4.0 06/02/2018 1358   CL 106 06/02/2018 1358   CO2 25 06/02/2018 1358   GLUCOSE 85 06/02/2018 1358   BUN 15 06/02/2018 1358   CREATININE 1.00 06/02/2018 1358   CALCIUM 9.1 06/02/2018 1358   PROT 8.0 06/02/2018 1358   ALBUMIN 3.7 06/02/2018 1358   AST 16 06/02/2018 1358   ALT 6 06/02/2018 1358   ALKPHOS 54 06/02/2018 1358   BILITOT 0.6 06/02/2018 1358   GFRNONAA 45 (L) 06/02/2018 1358   GFRAA 52 (L) 06/02/2018 1358       Component Value Date/Time   WBC 2.7 (L) 06/02/2018 1358   WBC 2.6 (L) 05/24/2018 1307   RBC 2.26 (L) 06/02/2018 1358   HGB 7.3 (L) 06/02/2018 1358   HCT 21.8 (L) 06/02/2018 1358   HCT 27.3 (L) 10/28/2017 1242   PLT 204 06/02/2018 1358   MCV 96.5 06/02/2018 1358   MCH 32.3 06/02/2018 1358   MCHC 33.5 06/02/2018 1358   RDW 16.0 (H) 06/02/2018 1358   LYMPHSABS 0.5 (L) 06/02/2018 1358   MONOABS 0.4 06/02/2018 1358   EOSABS 0.0 06/02/2018 1358   BASOSABS 0.0 06/02/2018 1358   -------------------------------  Imaging from last 24 hours (if applicable):  Radiology interpretation: No results found.      This case was discussed with [name]. He expresses agreement with my management of this patient.

## 2018-06-06 ENCOUNTER — Inpatient Hospital Stay: Payer: Medicare Other

## 2018-06-06 ENCOUNTER — Telehealth: Payer: Self-pay | Admitting: Medical

## 2018-06-06 ENCOUNTER — Inpatient Hospital Stay (HOSPITAL_BASED_OUTPATIENT_CLINIC_OR_DEPARTMENT_OTHER): Payer: Medicare Other | Admitting: Medical

## 2018-06-06 VITALS — BP 165/81 | HR 66 | Temp 98.6°F | Resp 18 | Ht 62.0 in | Wt 128.9 lb

## 2018-06-06 DIAGNOSIS — D649 Anemia, unspecified: Secondary | ICD-10-CM

## 2018-06-06 DIAGNOSIS — D509 Iron deficiency anemia, unspecified: Secondary | ICD-10-CM

## 2018-06-06 LAB — CBC WITH DIFFERENTIAL (CANCER CENTER ONLY)
BASOS ABS: 0 10*3/uL (ref 0.0–0.1)
Basophils Relative: 0 %
EOS ABS: 0.1 10*3/uL (ref 0.0–0.5)
EOS PCT: 2 %
HCT: 29.9 % — ABNORMAL LOW (ref 34.8–46.6)
Hemoglobin: 9.7 g/dL — ABNORMAL LOW (ref 11.6–15.9)
LYMPHS ABS: 0.6 10*3/uL — AB (ref 0.9–3.3)
Lymphocytes Relative: 22 %
MCH: 31.5 pg (ref 25.1–34.0)
MCHC: 32.4 g/dL (ref 31.5–36.0)
MCV: 97.1 fL (ref 79.5–101.0)
Monocytes Absolute: 0.2 10*3/uL (ref 0.1–0.9)
Monocytes Relative: 8 %
Neutro Abs: 1.9 10*3/uL (ref 1.5–6.5)
Neutrophils Relative %: 68 %
PLATELETS: 172 10*3/uL (ref 145–400)
RBC: 3.08 MIL/uL — AB (ref 3.70–5.45)
RDW: 16 % — ABNORMAL HIGH (ref 11.2–14.5)
WBC: 2.8 10*3/uL — AB (ref 3.9–10.3)

## 2018-06-06 NOTE — Telephone Encounter (Signed)
Appts scheduled AVS/Calendar printed per 8/19 los °

## 2018-06-06 NOTE — Patient Instructions (Signed)
Anemia Anemia is a condition in which you do not have enough red blood cells or hemoglobin. Hemoglobin is a substance in red blood cells that carries oxygen. When you do not have enough red blood cells or hemoglobin (are anemic), your body cannot get enough oxygen and your organs may not work properly. As a result, you may feel very tired or have other problems. What are the causes? Common causes of anemia include:  Excessive bleeding. Anemia can be caused by excessive bleeding inside or outside the body, including bleeding from the intestine or from periods in women.  Poor nutrition.  Long-lasting (chronic) kidney, thyroid, and liver disease.  Bone marrow disorders.  Cancer and treatments for cancer.  HIV (human immunodeficiency virus) and AIDS (acquired immunodeficiency syndrome).  Treatments for HIV and AIDS.  Spleen problems.  Blood disorders.  Infections, medicines, and autoimmune disorders that destroy red blood cells.  What are the signs or symptoms? Symptoms of this condition include:  Minor weakness.  Dizziness.  Headache.  Feeling heartbeats that are irregular or faster than normal (palpitations).  Shortness of breath, especially with exercise.  Paleness.  Cold sensitivity.  Indigestion.  Nausea.  Difficulty sleeping.  Difficulty concentrating.  Symptoms may occur suddenly or develop slowly. If your anemia is mild, you may not have symptoms. How is this diagnosed? This condition is diagnosed based on:  Blood tests.  Your medical history.  A physical exam.  Bone marrow biopsy.  Your health care provider may also check your stool (feces) for blood and may do additional testing to look for the cause of your bleeding. You may also have other tests, including:  Imaging tests, such as a CT scan or MRI.  Endoscopy.  Colonoscopy.  How is this treated? Treatment for this condition depends on the cause. If you continue to lose a lot of blood,  you may need to be treated at a hospital. Treatment may include:  Taking supplements of iron, vitamin B12, or folic acid.  Taking a hormone medicine (erythropoietin) that can help to stimulate red blood cell growth.  Having a blood transfusion. This may be needed if you lose a lot of blood.  Making changes to your diet.  Having surgery to remove your spleen.  Follow these instructions at home:  Take over-the-counter and prescription medicines only as told by your health care provider.  Take supplements only as told by your health care provider.  Follow any diet instructions that you were given.  Keep all follow-up visits as told by your health care provider. This is important. Contact a health care provider if:  You develop new bleeding anywhere in the body. Get help right away if:  You are very weak.  You are short of breath.  You have pain in your abdomen or chest.  You are dizzy or feel faint.  You have trouble concentrating.  You have bloody or black, tarry stools.  You vomit repeatedly or you vomit up blood. Summary  Anemia is a condition in which you do not have enough red blood cells or enough of a substance in your red blood cells that carries oxygen (hemoglobin).  Symptoms may occur suddenly or develop slowly.  If your anemia is mild, you may not have symptoms.  This condition is diagnosed with blood tests as well as a medical history and physical exam. Other tests may be needed.  Treatment for this condition depends on the cause of the anemia. This information is not intended to replace advice   given to you by your health care provider. Make sure you discuss any questions you have with your health care provider. Document Released: 11/12/2004 Document Revised: 11/06/2016 Document Reviewed: 11/06/2016 Elsevier Interactive Patient Education  [employer].

## 2018-06-06 NOTE — Progress Notes (Signed)
Pt seen by PA Lucianne Lei only, no RN assessment taken at this time.  PA aware.

## 2018-06-08 NOTE — Progress Notes (Signed)
Wendy [name] presents to the clinic today with her daughter.  She was last seen 4 days ago when her hemoglobin was 7.3.  She and her daughter elected to delay a transfusion and have Wendy [name] returned today for a count check.  She continues to be at her baseline.  She denies any progression of her fatigue.  She is having no shortness of breath, chest pain, or dizziness.  Her labs today returned with a hemoglobin higher at 9.7.  Based on this we will have the patient return to the clinic in 4 weeks for a repeat CBC and exam.  She currently has an appointment with Dr. Sullivan Lone on 08/02/2018 but would like to be seen earlier.  Sandi Mealy, MHS, PA-C Physician Assistant

## 2018-07-04 ENCOUNTER — Inpatient Hospital Stay: Payer: Medicare Other | Admitting: Hematology

## 2018-07-04 ENCOUNTER — Inpatient Hospital Stay: Payer: Medicare Other | Attending: Hematology

## 2018-07-20 ENCOUNTER — Telehealth: Payer: Self-pay | Admitting: Oncology

## 2018-07-20 NOTE — Telephone Encounter (Signed)
Patient's daughter, Hilda Blades called and asked when her next appointment is scheduled with Dr. Sondra Come.  Hilda Blades said her mother has no energy and is sleeping late and she thinks she should be seen next week.  Left a message for Enid Derry, Medical Secretary to schedule appointment.

## 2018-08-01 ENCOUNTER — Ambulatory Visit
Admission: RE | Admit: 2018-08-01 | Discharge: 2018-08-01 | Disposition: A | Discharge status: Home / Self Care | Payer: Medicare Other | Source: Ambulatory Visit | Attending: Radiation Oncology | Admitting: Radiation Oncology

## 2018-08-01 ENCOUNTER — Encounter: Payer: Self-pay | Admitting: Radiation Oncology

## 2018-08-01 ENCOUNTER — Other Ambulatory Visit: Payer: Self-pay

## 2018-08-01 VITALS — BP 152/85 | HR 72 | Temp 97.9°F | Resp 18 | Ht 63.0 in | Wt 129.2 lb

## 2018-08-01 DIAGNOSIS — Z7982 Long term (current) use of aspirin: Secondary | ICD-10-CM | POA: Diagnosis not present

## 2018-08-01 DIAGNOSIS — Z923 Personal history of irradiation: Secondary | ICD-10-CM | POA: Insufficient documentation

## 2018-08-01 DIAGNOSIS — Z79890 Hormone replacement therapy (postmenopausal): Secondary | ICD-10-CM | POA: Diagnosis not present

## 2018-08-01 DIAGNOSIS — C55 Malignant neoplasm of uterus, part unspecified: Secondary | ICD-10-CM | POA: Insufficient documentation

## 2018-08-01 DIAGNOSIS — Z79899 Other long term (current) drug therapy: Secondary | ICD-10-CM | POA: Insufficient documentation

## 2018-08-01 NOTE — Progress Notes (Signed)
[name] is here for a follow-up appointment. Patient denies any pain or fatigue.Patient denies any vaginal or rectal bleeding. Patient denies any vaginal discharge. Patient denies any issues with her bowels. Patient denies any issue with her skin at her radiation site. Patient states that she does not void during the night. Patient denies any nausea or vomiting. Patient denies any dysuria. States that she has pain in her abdominal area sometime. Vitals:   08/01/18 1526  BP: (!) 152/85  Pulse: 72  Resp: 18  Temp: 97.9 F (36.6 C)  TempSrc: Oral  SpO2: 99%  Weight: 129 lb 4 oz (58.6 kg)  Height: 5\' 3"  (1.6 m)   Wt Readings from Last 3 Encounters:  08/01/18 129 lb 4 oz (58.6 kg)  06/06/18 128 lb 14.4 oz (58.5 kg)  06/02/18 127 lb 8 oz (57.8 kg)

## 2018-08-01 NOTE — Progress Notes (Signed)
HEMATOLOGY ONCOLOGY CLINIC NOTE  Date of Service: 08/02/18   Patient Care Team: Lajean Manes, MD as PCP - General (Internal Medicine)  CHIEF COMPLAINTS/PURPOSE OF CONSULTATION:  Anemia F/u for recently diagnosed uterine papillary carcinoma.  HISTORY OF PRESENTING ILLNESS:   Wendy [name] 84 y.o. female is here because of worsening anemia.  Patient has a history of hypertension and hypothyroidism who as per her daughter has been a very active person until recently. Her daughter notes that patient's levothyroxine dose was reduced from 75 mcg to 50 mcg daily and this produced a significant change in the patient's cognition and energy levels.  She reports that it is very unusual for the patient to wake up late and then sleep throughout the day as she is currently doing. Per the patient's daughter she was a very active person, who would do a lot of walking, including walking around the track.   She notes that the patient has been slowly declining over the past year ever since seeing a new provider who started decreasing her thyroid medication. The patient's appetite has been declining as well.    She was seen in the ED for two syncopal episodes. On 09/04/2018 .  She was noted to be hypotensive and hypoxic.  Symptoms were thought to be related to dehydration due to poor oral intake.  Echo showed normal ejection fraction of 60-65% with grade 2 diastolic dysfunction.  Her losartan dose was significantly reduced on discharge.  She was noted to have several episodes of nonsustained VT but her heart rate was in the 50s.  Hemoglobin was noted to be between 7 and 7.5 but transfusion was withheld due to presence of positive Coombs test and outpatient hematology consult was recommended. FOBT was never sent.  She was found to have abnormal CBC from 09/07/2017 -with hemoglobin of 7 MCV of 95, WBC count of 3k with an ANC of 2.3k.  And a platelet count of 175k.  Reticulocyte percentage of 1.9,  absolute reticulocyte count of 45.8k/ul .  She was noted to have a positive Coombs test but her LDH and haptoglobin levels were within normal limits suggesting against overt hemolysis.  ANA negative. Sedimentation rate 138.  CRP within normal limits. Free T4 0.87 Ferritin 857 with 23% iron saturation. Folate 19.5 B12 - 470  She denies recent chest pain on exertion, shortness of breath on minimal exertion, or palpitations. She denies cough. She had not noticed any recent bleeding such as epistaxis, hematuria or hematochezia.   The patient denies over the counter NSAID ingestion. She is not on antiplatelets agents.   She had no prior history or diagnosis of cancer. Her age appropriate screening programs are up-to-date.  She denies any pica and eats a variety of diet.  She never donated blood or received blood transfusion.   Some days her appetite is better than others. The patients daughter states she has also become more forgetful at times.  Pt denies fever, chills, trouble swelling, abdominal pain, or any other symptoms or complaints at this time.  Patient really knows that she still feels somewhat fatigued but has no overt lightheadedness or dizziness at this time and feels better than when she was admitted. No overt infectious issues. No overt evidence of bleeding.  Some unquantified weight loss due to decreased appetite. No other overt focal symptoms.  Interval History:   Wendy [name] is here regarding her borderline macrocytic anemia  and leucopenia and her recently diagnosed uterine high grade papillary caricnoma. The  patient's last visit with [place] was on 05/24/18. She is accompanied today by her daughter. The pt reports that she is doing well overall.   The pt reports that she has been eating fair, improved from last time. The pt notes that her energy levels have been a little below her baseline, and is mostly doing what she likes.   The pt notes that she has not had any vaginal  bleeding. No abdominal pain or discomfort.  Lab results today (08/02/18) of CBC w/diff, CMP, and Reticulocytes is as follows: all values are WNL except for WBC at 2.2k, RBC at 3.41, HGB at 10.4, HCT at 32.3, RDW at 16.2, ANC at 1.3k, Lymphs abs at 400, GFR at 54. 08/02/18 Ferritin is 995  On review of systems, pt reports eating better, mostly stable energy levels, moving her bowels well, and denies vaginal bleeding, abdominal pains, leg swelling, problems passing urine, and any other symptoms.    MEDICAL HISTORY:  Past Medical History:  Diagnosis Date  . History of radiation therapy 02/21/2018-03/31/2018   Uterus, pelvis1.8 Gy in 25 fractions for a total dose of 45 Gy     . Hypertension   . Thyroid disease   Nonsustained VT Hypothyroidism Acute kidney injury 08/2017 Depression-on Celexa.  SURGICAL HISTORY: Past Surgical History:  Procedure Laterality Date  . CORONARY ANGIOPLASTY      SOCIAL HISTORY: Social History   Socioeconomic History  . Marital status: Widowed    Spouse name: Not on file  . Number of children: Not on file  . Years of education: Not on file  . Highest education level: Not on file  Occupational History  . Not on file  Social Needs  . Financial resource strain: Not on file  . Food insecurity:    Worry: Not on file    Inability: Not on file  . Transportation needs:    Medical: Not on file    Non-medical: Not on file  Tobacco Use  . Smoking status: Never Smoker  . Smokeless tobacco: Never Used  Substance and Sexual Activity  . Alcohol use: No  . Drug use: No  . Sexual activity: Never  Lifestyle  . Physical activity:    Days per week: Not on file    Minutes per session: Not on file  . Stress: Not on file  Relationships  . Social connections:    Talks on phone: Not on file    Gets together: Not on file    Attends religious service: Not on file    Active member of club or organization: Not on file    Attends meetings of clubs or organizations:  Not on file    Relationship status: Not on file  . Intimate partner violence:    Fear of current or ex partner: Not on file    Emotionally abused: Not on file    Physically abused: Not on file    Forced sexual activity: Not on file  Other Topics Concern  . Not on file  Social History Narrative  . Not on file    FAMILY HISTORY: History reviewed. No pertinent family history.  ALLERGIES:  has No Known Allergies.  MEDICATIONS:  Current Outpatient Medications  Medication Sig Dispense Refill  . aspirin EC 81 MG tablet Take 81 mg daily after supper by mouth.     . citalopram (CELEXA) 10 MG tablet Take 5 mg daily after supper by mouth.     . dexamethasone (DECADRON) 2 MG tablet Take 1 tablet (  2 mg total) by mouth daily after breakfast. (Patient not taking: Reported on 08/01/2018) 30 tablet 1  . ENSURE (ENSURE) Take 237 mLs by mouth 2 (two) times daily between meals.    . hydrocortisone cream 1 % Apply to affected area 2 times daily 15 g 0  . Iron Combinations (NIFEREX) TABS TAKE 1 TABLET BY MOUTH DAILY (Patient not taking: Reported on 08/01/2018) 30 tablet 0  . iron polysaccharides (NIFEREX) 150 MG capsule Take 1 capsule (150 mg total) by mouth daily. (Patient not taking: Reported on 08/01/2018) 30 capsule 0  . levothyroxine (SYNTHROID, LEVOTHROID) 75 MCG tablet Take 75 mcg by mouth daily before breakfast.    . losartan (COZAAR) 50 MG tablet Take 50 mg by mouth daily.    . pantoprazole (PROTONIX) 40 MG tablet Take 1 tablet (40 mg total) by mouth daily. (Patient not taking: Reported on 08/01/2018) 30 tablet 5  . triamcinolone (KENALOG) 0.025 % cream Apply 1 application 2 (two) times daily as needed topically (rash).    . Vitamin D, Ergocalciferol, (DRISDOL) 50000 units CAPS capsule Take 50,000 Units every 7 (seven) days by mouth.     No current facility-administered medications for this visit.     REVIEW OF SYSTEMS:    A 10+ POINT REVIEW OF SYSTEMS WAS OBTAINED including neurology,  dermatology, psychiatry, cardiac, respiratory, lymph, extremities, GI, GU, Musculoskeletal, constitutional, breasts, reproductive, HEENT.  All pertinent positives are noted in the HPI.  All others are negative.   PHYSICAL EXAMINATION: ECOG PERFORMANCE STATUS: 2 - Symptomatic, <50% confined to bed  Vitals:   08/02/18 1338  BP: (!) 189/87  Pulse: 60  Resp: 18  Temp: 98 F (36.7 C)  SpO2: 100%   Filed Weights   08/02/18 1338  Weight: 129 lb 3.2 oz (58.6 kg)     GENERAL:alert, in no acute distress and comfortable SKIN: no acute rashes, no significant lesions EYES: conjunctiva are pink and non-injected, sclera anicteric OROPHARYNX: MMM, no exudates, no oropharyngeal erythema or ulceration NECK: supple, no JVD LYMPH:  no palpable lymphadenopathy in the cervical, axillary or inguinal regions LUNGS: clear to auscultation b/l with normal respiratory effort HEART: regular rate & rhythm ABDOMEN:  normoactive bowel sounds , non tender, not distended. No palpable hepatosplenomegaly.  Extremity: no pedal edema PSYCH: alert & oriented x 3 with fluent speech NEURO: no focal motor/sensory deficits   LABORATORY DATA:  I have reviewed the data as listed  . CBC Latest Ref Rng & Units 08/02/2018 06/06/2018 06/02/2018  WBC 4.0 - 10.5 K/uL 2.2(L) 2.8(L) 2.7(L)  Hemoglobin 12.0 - 15.0 g/dL 10.4(L) 9.7(L) 7.3(L)  Hematocrit 36.0 - 46.0 % 32.3(L) 29.9(L) 21.8(L)  Platelets 150 - 400 K/uL 158 172 204  ANC 1.3k . CMP Latest Ref Rng & Units 08/02/2018 06/02/2018 05/24/2018  Glucose 70 - 99 mg/dL 85 85 87  BUN 8 - 23 mg/dL 14 15 13   Creatinine 0.44 - 1.00 mg/dL 0.97 1.00 1.06(H)  Sodium 135 - 145 mmol/L 142 140 140  Potassium 3.5 - 5.1 mmol/L 4.3 4.0 4.2  Chloride 98 - 111 mmol/L 106 106 106  CO2 22 - 32 mmol/L 28 25 25   Calcium 8.9 - 10.3 mg/dL 9.5 9.1 9.1  Total Protein 6.5 - 8.1 g/dL 8.0 8.0 8.0  Total Bilirubin 0.3 - 1.2 mg/dL 0.5 0.6 0.6  Alkaline Phos 38 - 126 U/L 65 54 61  AST 15 - 41 U/L  20 16 18   ALT 0 - 44 U/L 9 6 10  RADIOGRAPHIC STUDIES: I have personally reviewed the radiological images as listed and agreed with the findings in the report. No results found.  ASSESSMENT & PLAN:   84 y.o. female   1) Normocytic Anemia likely multifactorial  hgb improved from 8.1 to 9.5 Likely MDS + blood loss from uterine high grade papillary uterine carcinoma with vaginal bleeding + Anemia of chronic disease related to newly diagnosed malignancy.  Slightly elevated LDH level at 297 but normal haptoglobin suggesting against acute hemolysis.   Homocystine is elevated though B12 level is within normal limits and RBC folate- WNL  10/28/17 MM panel showed no M-Protein, with elevated IgG and IgM. Kappa and lambda light chain were both elevated- likely reactive to newly diagnosed malignancy. No overt evidence of GI bleeding.  No evidence of iron deficiency. Vaginal bleeding has resolved for now Cannot rule out MDS given the patient's advanced age.  2) leukopenia 2.2k with mild neutropenia 1.3k  3)hypothyroidism  -on levothyroxine replacement on 71mcg po daily.  4) Elevated LDH levels -no overt evidence of hemolysis given normal haptoglobin level and no reticulocytosis and normal bilirubin levels.  Likely from uterine papillary carcinoma 5) Positive direct Coombs test - no evidence of overt hemolysis at this time.  6) Recently diagnosed high grade papillary uterine carcinoma . S/p completion of palliative RT. Deemed not a good candidate for chemotherapy. Currently with resolution of vaginal bleeding. Initial CT with no evidence of metastatic disease  PLAN  -Pt has finished palliative radiation with resolution of vaginal bleeding. -Discontinue PO Iron replacement - given elevated ferritin levels -Continue 2mg  Dexamethasone for appetite stimulation -Discussed pt labwork today, 08/02/18; HGB improved to 10.4, some neutropenia with ANC at 1.3k, blood counts and chemistries are  otherwise stable  -Per pt, vaginal bleeding has resolved -Continue eating well and using Ensure for meal supplements -Continue thyroid replacement would not decrease dose even if TSH WNL since patient had cognitive issues with previous attempt at thyroid dose reduction. -Will see the pt back in 4 months   5) . Patient Active Problem List   Diagnosis Date Noted  . Uterine carcinoma ([place]) 01/31/2018  . Syncope 09/28/2013  . Altered mental status 09/26/2013  . Syncope and collapse 09/26/2013  . Hypothyroidism 09/26/2013  . Depression 09/26/2013  . HTN (hypertension) 09/26/2013  Plan -continue f/u with PCP   RTC with Dr Irene Limbo in 3 months with labs     The total time spent in the appt was 20 minutes and more than 50% was on counseling and direct patient cares.   Sullivan Lone MD MS AAHIVMS [facility] Hematology/Oncology Physician [facility]  (Office):       [phone] (Work cell):  [phone] (Fax):           [phone]   I, Baldwin Jamaica, am acting as a scribe for [name]  .I have reviewed the above documentation for accuracy and completeness, and I agree with the above. Brunetta Genera MD

## 2018-08-01 NOTE — Progress Notes (Signed)
Radiation Oncology         [phone] ________________________________  Name: Wendy [name] MRN: [id number]  Date: 08/01/2018  DOB: Apr 23, 1917  Follow-Up Visit Note  CC: Lajean Manes, MD  [name], MD    ICD-10-CM   1. Uterine carcinoma (HCC) C55     Diagnosis:   Stage III-B High Grade Serous Uterine Carcinoma  Interval Since Last Radiation:  4 months 02/21/18 - 03/31/18 Pelvis/ 3D/ 45 Gy in 25 fractions  Narrative:  The patient returns today for follow-up accompanied by her daughter. She reports feeling well overall. She is scheduled to see [name] tomorrow, 08/02/18.   She reports pain to her abdominal area sometimes. She notes improvement in her fatigue. She denies any pain, fatigue, vaginal or rectal bleeding, vaginal discharge, issues with her bowels, issues with her skin at the radiation site, nausea or vomiting, nocturia, and dysuria.   ALLERGIES:  has No Known Allergies.  Meds: Current Outpatient Medications  Medication Sig Dispense Refill  . citalopram (CELEXA) 10 MG tablet Take 5 mg daily after supper by mouth.     . ENSURE (ENSURE) Take 237 mLs by mouth 2 (two) times daily between meals.    . hydrocortisone cream 1 % Apply to affected area 2 times daily 15 g 0  . levothyroxine (SYNTHROID, LEVOTHROID) 75 MCG tablet Take 75 mcg by mouth daily before breakfast.    . losartan (COZAAR) 50 MG tablet Take 50 mg by mouth daily.    Marland Kitchen triamcinolone (KENALOG) 0.025 % cream Apply 1 application 2 (two) times daily as needed topically (rash).    . Vitamin D, Ergocalciferol, (DRISDOL) 50000 units CAPS capsule Take 50,000 Units every 7 (seven) days by mouth.    Marland Kitchen aspirin EC 81 MG tablet Take 81 mg daily after supper by mouth.     . dexamethasone (DECADRON) 2 MG tablet Take 1 tablet (2 mg total) by mouth daily after breakfast. (Patient not taking: Reported on 08/01/2018) 30 tablet 1  . Iron Combinations (NIFEREX) TABS TAKE 1 TABLET BY MOUTH DAILY (Patient not taking:  Reported on 08/01/2018) 30 tablet 0  . iron polysaccharides (NIFEREX) 150 MG capsule Take 1 capsule (150 mg total) by mouth daily. (Patient not taking: Reported on 08/01/2018) 30 capsule 0  . pantoprazole (PROTONIX) 40 MG tablet Take 1 tablet (40 mg total) by mouth daily. (Patient not taking: Reported on 08/01/2018) 30 tablet 5   No current facility-administered medications for this encounter.     Physical Findings: The patient is in no acute distress. Patient is alert and oriented.  height is 5\' 3"  (1.6 m) and weight is 129 lb 4 oz (58.6 kg). Her oral temperature is 97.9 F (36.6 C). Her blood pressure is 152/85 (abnormal) and her pulse is 72. Her respiration is 18 and oxygen saturation is 99%.  Lungs are clear to auscultation bilaterally. Heart has regular rate and rhythm. No palpable cervical, supraclavicular, or axillary adenopathy. Abdomen soft, non-tender, normal bowel sounds. Patient would prefer not to have pelvic exam. She feels she is doing well.  Lab Findings: Lab Results  Component Value Date   WBC 2.8 (L) 06/06/2018   HGB 9.7 (L) 06/06/2018   HCT 29.9 (L) 06/06/2018   MCV 97.1 06/06/2018   PLT 172 06/06/2018    Radiographic Findings: No results found.  Impression:  Stage III-B High Grade Serous Uterine Carcinoma  Clinically stable. Unable to evaluate uterine carcinoma since she would not agree with pelvic exam.  Plan:  She will follow up with [name] tomorrow. She will be scheduled for routine follow-up in three months. Her daughter will call for an earlier appointment if she develops any vaginal bleeding.  ___________________________________ -----------------------------------  Blair Promise, PhD, MD  This document serves as a record of services personally performed by Gery Pray, MD. It was created on his behalf by Wilburn Mylar, a trained medical scribe. The creation of this record is based on the scribe's personal observations and the provider's  statements to them. This document has been checked and approved by the attending provider.

## 2018-08-02 ENCOUNTER — Inpatient Hospital Stay: Payer: Medicare Other | Attending: Hematology

## 2018-08-02 ENCOUNTER — Telehealth: Payer: Self-pay | Admitting: Hematology

## 2018-08-02 ENCOUNTER — Inpatient Hospital Stay (HOSPITAL_BASED_OUTPATIENT_CLINIC_OR_DEPARTMENT_OTHER): Payer: Medicare Other | Admitting: Hematology

## 2018-08-02 ENCOUNTER — Encounter: Payer: Self-pay | Admitting: Hematology

## 2018-08-02 VITALS — BP 189/87 | HR 60 | Temp 98.0°F | Resp 18 | Ht 63.0 in | Wt 129.2 lb

## 2018-08-02 DIAGNOSIS — Z79899 Other long term (current) drug therapy: Secondary | ICD-10-CM

## 2018-08-02 DIAGNOSIS — I10 Essential (primary) hypertension: Secondary | ICD-10-CM | POA: Insufficient documentation

## 2018-08-02 DIAGNOSIS — C55 Malignant neoplasm of uterus, part unspecified: Secondary | ICD-10-CM

## 2018-08-02 DIAGNOSIS — Z7982 Long term (current) use of aspirin: Secondary | ICD-10-CM | POA: Insufficient documentation

## 2018-08-02 DIAGNOSIS — D509 Iron deficiency anemia, unspecified: Secondary | ICD-10-CM

## 2018-08-02 DIAGNOSIS — E039 Hypothyroidism, unspecified: Secondary | ICD-10-CM | POA: Insufficient documentation

## 2018-08-02 DIAGNOSIS — F329 Major depressive disorder, single episode, unspecified: Secondary | ICD-10-CM

## 2018-08-02 DIAGNOSIS — D649 Anemia, unspecified: Secondary | ICD-10-CM

## 2018-08-02 DIAGNOSIS — D500 Iron deficiency anemia secondary to blood loss (chronic): Secondary | ICD-10-CM

## 2018-08-02 LAB — CBC WITH DIFFERENTIAL/PLATELET
Abs Immature Granulocytes: 0.02 10*3/uL (ref 0.00–0.07)
BASOS ABS: 0 10*3/uL (ref 0.0–0.1)
Basophils Relative: 1 %
EOS PCT: 3 %
Eosinophils Absolute: 0.1 10*3/uL (ref 0.0–0.5)
HEMATOCRIT: 32.3 % — AB (ref 36.0–46.0)
HEMOGLOBIN: 10.4 g/dL — AB (ref 12.0–15.0)
Immature Granulocytes: 1 %
LYMPHS ABS: 0.4 10*3/uL — AB (ref 0.7–4.0)
LYMPHS PCT: 18 %
MCH: 30.5 pg (ref 26.0–34.0)
MCHC: 32.2 g/dL (ref 30.0–36.0)
MCV: 94.7 fL (ref 80.0–100.0)
MONOS PCT: 16 %
Monocytes Absolute: 0.4 10*3/uL (ref 0.1–1.0)
NRBC: 0 % (ref 0.0–0.2)
Neutro Abs: 1.3 10*3/uL — ABNORMAL LOW (ref 1.7–7.7)
Neutrophils Relative %: 61 %
PLATELETS: 158 10*3/uL (ref 150–400)
RBC: 3.41 MIL/uL — ABNORMAL LOW (ref 3.87–5.11)
RDW: 16.2 % — ABNORMAL HIGH (ref 11.5–15.5)
WBC: 2.2 10*3/uL — ABNORMAL LOW (ref 4.0–10.5)

## 2018-08-02 LAB — CMP (CANCER CENTER ONLY)
ALBUMIN: 3.6 g/dL (ref 3.5–5.0)
ALK PHOS: 65 U/L (ref 38–126)
ALT: 9 U/L (ref 0–44)
AST: 20 U/L (ref 15–41)
Anion gap: 8 (ref 5–15)
BUN: 14 mg/dL (ref 8–23)
CHLORIDE: 106 mmol/L (ref 98–111)
CO2: 28 mmol/L (ref 22–32)
CREATININE: 0.97 mg/dL (ref 0.44–1.00)
Calcium: 9.5 mg/dL (ref 8.9–10.3)
GFR, Est AFR Am: 54 mL/min — ABNORMAL LOW (ref >60)
GFR, Estimated: 47 mL/min — ABNORMAL LOW (ref >60)
GLUCOSE: 85 mg/dL (ref 70–99)
Potassium: 4.3 mmol/L (ref 3.5–5.1)
SODIUM: 142 mmol/L (ref 135–145)
Total Bilirubin: 0.5 mg/dL (ref 0.3–1.2)
Total Protein: 8 g/dL (ref 6.5–8.1)

## 2018-08-02 LAB — FERRITIN: FERRITIN: 956 ng/mL — AB (ref 11–307)

## 2018-08-02 NOTE — Telephone Encounter (Signed)
Gave pt avs and calendar  °

## 2018-09-05 ENCOUNTER — Telehealth: Payer: Self-pay | Admitting: Oncology

## 2018-09-05 ENCOUNTER — Other Ambulatory Visit: Payer: Self-pay | Admitting: *Deleted

## 2018-09-05 ENCOUNTER — Encounter: Payer: Medicare Other | Admitting: Medical

## 2018-09-05 MED ORDER — LEVOTHYROXINE SODIUM 75 MCG PO TABS: 75.0000 ug | ORAL_TABLET | Freq: Every day | ORAL | 1 refills | Status: DC

## 2018-09-05 NOTE — Telephone Encounter (Signed)
Wendy [name] called back and said her mother wants to cancel the appointment today.  Discussed that they can go to urgent care if needed or call back tomorrow.

## 2018-09-05 NOTE — Telephone Encounter (Signed)
Patient's daughter, Hilda Blades called and said her mother has not been feeling well today with a headache and stomach ache.  She said she got up this morning at 7 am and went right back to bed which is not like her.  She does not know if Tatum has a fever or nausea/diarrhea.  She is wondering if she can be seen today.  Appointment given with symptom management at 3 pm with arrival at 2:30 in case labs are needed.     Hilda Blades also said they are having trouble getting Nidhi's synthroid filled through her PCP.  They are wondering if [name] would be able to refill it. Advised her that I would notify Dr. Grier Mitts nurse.

## 2018-10-10 ENCOUNTER — Telehealth: Payer: Self-pay | Admitting: Oncology

## 2018-10-10 NOTE — Telephone Encounter (Signed)
Received message from Wendy [name] (daughter) that Wendy [name] was having abdominal pain and some bleeding.  Called the house number and spoke to Wendy [name].  She denied having any current abdominal pain and denied having any bleeding.  She said she is doing good.  Advised her to call if she does have any pain/bleeding in the future.

## 2018-10-28 ENCOUNTER — Telehealth: Payer: Self-pay | Admitting: Oncology

## 2018-10-28 ENCOUNTER — Telehealth: Payer: Self-pay | Admitting: *Deleted

## 2018-10-28 NOTE — Telephone Encounter (Signed)
[name] called and said that Wendy [name] is having a small amount of vaginal bleeding.  Wendy [name] is denying having any bleeding but [name] did see some in the toilet accidentally.  She is wondering she needs to be seen.  Advised her that there is an appointment scheduled with [name] on 11/01/18 and that Enid Derry, Medical Secretary will call her with an appointment to see Dr. Sondra Come.  She verbalized understanding and agreement.

## 2018-10-28 NOTE — Telephone Encounter (Signed)
CALLED PATIENT TO INFORM OF FU WITH DR. KINARD ON 11-28-18 @ 4 PM, LINE BUSY WILL CALL LATER

## 2018-10-31 NOTE — Progress Notes (Signed)
HEMATOLOGY ONCOLOGY CLINIC NOTE  Date of Service: 11/01/18   Patient Care Team: Lajean Manes, MD as PCP - General (Internal Medicine)  CHIEF COMPLAINTS/PURPOSE OF CONSULTATION:  Anemia F/u for recently diagnosed uterine papillary carcinoma.  HISTORY OF PRESENTING ILLNESS:   Wendy [name] 85 y.o. female is here because of worsening anemia.  Patient has a history of hypertension and hypothyroidism who as per her daughter has been a very active person until recently. Her daughter notes that patient's levothyroxine dose was reduced from 75 mcg to 50 mcg daily and this produced a significant change in the patient's cognition and energy levels.  She reports that it is very unusual for the patient to wake up late and then sleep throughout the day as she is currently doing. Per the patient's daughter she was a very active person, who would do a lot of walking, including walking around the track.   She notes that the patient has been slowly declining over the past year ever since seeing a new provider who started decreasing her thyroid medication. The patient's appetite has been declining as well.    She was seen in the ED for two syncopal episodes. On 09/04/2018 .  She was noted to be hypotensive and hypoxic.  Symptoms were thought to be related to dehydration due to poor oral intake.  Echo showed normal ejection fraction of 60-65% with grade 2 diastolic dysfunction.  Her losartan dose was significantly reduced on discharge.  She was noted to have several episodes of nonsustained VT but her heart rate was in the 50s.  Hemoglobin was noted to be between 7 and 7.5 but transfusion was withheld due to presence of positive Coombs test and outpatient hematology consult was recommended. FOBT was never sent.  She was found to have abnormal CBC from 09/07/2017 -with hemoglobin of 7 MCV of 95, WBC count of 3k with an ANC of 2.3k.  And a platelet count of 175k.  Reticulocyte percentage of 1.9,  absolute reticulocyte count of 45.8k/ul .  She was noted to have a positive Coombs test but her LDH and haptoglobin levels were within normal limits suggesting against overt hemolysis.  ANA negative. Sedimentation rate 138.  CRP within normal limits. Free T4 0.87 Ferritin 857 with 23% iron saturation. Folate 19.5 B12 - 470  She denies recent chest pain on exertion, shortness of breath on minimal exertion, or palpitations. She denies cough. She had not noticed any recent bleeding such as epistaxis, hematuria or hematochezia.   The patient denies over the counter NSAID ingestion. She is not on antiplatelets agents.   She had no prior history or diagnosis of cancer. Her age appropriate screening programs are up-to-date.  She denies any pica and eats a variety of diet.  She never donated blood or received blood transfusion.   Some days her appetite is better than others. The patients daughter states she has also become more forgetful at times.  Pt denies fever, chills, trouble swelling, abdominal pain, or any other symptoms or complaints at this time.  Patient really knows that she still feels somewhat fatigued but has no overt lightheadedness or dizziness at this time and feels better than when she was admitted. No overt infectious issues. No overt evidence of bleeding.  Some unquantified weight loss due to decreased appetite. No other overt focal symptoms.  Interval History:   Wendy [name] is here regarding her borderline macrocytic anemia  and leucopenia and her recently diagnosed uterine high grade papillary caricnoma. The  patient's last visit with [place] was on 08/02/18. She is accompanied today by her daughter. The pt reports that she is doing well overall.   The pt reports that she sometimes has some central abdominal pains, which present about once a week. Characterizes these pains as similar to when she used to have periods. She denies any associations with onset, and denies  constipation. The pt notes that she moves her bowels almost every day. She notes that she has been eating well and has gained 5 pounds in the interim.   The pt also notes that she is having some vaginal bleeding, not every day, and not in large amounts. She notes that the frequency is "seldom.". The pt denies blood in the stools.  The pt notes that she fell once in the interim, and tripped on "some clutter," but did not sustain any injuries.   The pt has enjoyed visiting with family and spending time with her older sister.  Lab results today (11/01/18) of CBC w/diff and CMP is as follows: all values are WNL except for WBC at 2.8k, RBC at 3.43, HGB at 10.2, HCT at 32.0, RDW at 17.0, Lymphs abs at 400, Creatinine at 1.06, GFR at 50. 11/01/18 Ferritin is pending 11/01/18 Iron and TIBC revealed all values WNL, including a 22% Saturation ratio.   On review of systems, pt reports weight gain, eating well, occasional abdominal cramps, seldom vaginal bleeding, stable energy levels, and denies constipation, blood in the stools, abdominal tenderness, leg swelling, and any other symptoms.   MEDICAL HISTORY:  Past Medical History:  Diagnosis Date  . History of radiation therapy 02/21/2018-03/31/2018   Uterus, pelvis1.8 Gy in 25 fractions for a total dose of 45 Gy     . Hypertension   . Thyroid disease   Nonsustained VT Hypothyroidism Acute kidney injury 08/2017 Depression-on Celexa.  SURGICAL HISTORY: Past Surgical History:  Procedure Laterality Date  . CORONARY ANGIOPLASTY      SOCIAL HISTORY: Social History   Socioeconomic History  . Marital status: Widowed    Spouse name: Not on file  . Number of children: Not on file  . Years of education: Not on file  . Highest education level: Not on file  Occupational History  . Not on file  Social Needs  . Financial resource strain: Not on file  . Food insecurity:    Worry: Not on file    Inability: Not on file  . Transportation needs:     Medical: Not on file    Non-medical: Not on file  Tobacco Use  . Smoking status: Never Smoker  . Smokeless tobacco: Never Used  Substance and Sexual Activity  . Alcohol use: No  . Drug use: No  . Sexual activity: Never  Lifestyle  . Physical activity:    Days per week: Not on file    Minutes per session: Not on file  . Stress: Not on file  Relationships  . Social connections:    Talks on phone: Not on file    Gets together: Not on file    Attends religious service: Not on file    Active member of club or organization: Not on file    Attends meetings of clubs or organizations: Not on file    Relationship status: Not on file  . Intimate partner violence:    Fear of current or ex partner: Not on file    Emotionally abused: Not on file    Physically abused: Not on file  Forced sexual activity: Not on file  Other Topics Concern  . Not on file  Social History Narrative  . Not on file    FAMILY HISTORY: No family history on file.  ALLERGIES:  has No Known Allergies.  MEDICATIONS:  Current Outpatient Medications  Medication Sig Dispense Refill  . aspirin EC 81 MG tablet Take 81 mg daily after supper by mouth.     . citalopram (CELEXA) 10 MG tablet Take 5 mg daily after supper by mouth.     . dexamethasone (DECADRON) 2 MG tablet Take 1 tablet (2 mg total) by mouth daily after breakfast. (Patient not taking: Reported on 08/01/2018) 30 tablet 1  . ENSURE (ENSURE) Take 237 mLs by mouth 2 (two) times daily between meals.    . hydrocortisone cream 1 % Apply to affected area 2 times daily 15 g 0  . Iron Combinations (NIFEREX) TABS TAKE 1 TABLET BY MOUTH DAILY (Patient not taking: Reported on 08/01/2018) 30 tablet 0  . iron polysaccharides (NIFEREX) 150 MG capsule Take 1 capsule (150 mg total) by mouth daily. (Patient not taking: Reported on 08/01/2018) 30 capsule 0  . levothyroxine (SYNTHROID, LEVOTHROID) 75 MCG tablet Take 1 tablet (75 mcg total) by mouth daily before breakfast.  30 tablet 1  . losartan (COZAAR) 50 MG tablet Take 50 mg by mouth daily.    . pantoprazole (PROTONIX) 40 MG tablet Take 1 tablet (40 mg total) by mouth daily. (Patient not taking: Reported on 08/01/2018) 30 tablet 5  . triamcinolone (KENALOG) 0.025 % cream Apply 1 application 2 (two) times daily as needed topically (rash).    . Vitamin D, Ergocalciferol, (DRISDOL) 50000 units CAPS capsule Take 50,000 Units every 7 (seven) days by mouth.     No current facility-administered medications for this visit.     REVIEW OF SYSTEMS:    A 10+ POINT REVIEW OF SYSTEMS WAS OBTAINED including neurology, dermatology, psychiatry, cardiac, respiratory, lymph, extremities, GI, GU, Musculoskeletal, constitutional, breasts, reproductive, HEENT.  All pertinent positives are noted in the HPI.  All others are negative.   PHYSICAL EXAMINATION: ECOG PERFORMANCE STATUS: 2 - Symptomatic, <50% confined to bed  Vitals:   11/01/18 1342  BP: (!) 155/76  Pulse: 67  Resp: 18  Temp: 98.3 F (36.8 C)  SpO2: 100%   Filed Weights   11/01/18 1342  Weight: 134 lb 8 oz (61 kg)     GENERAL:alert, in no acute distress and comfortable SKIN: no acute rashes, no significant lesions EYES: conjunctiva are pink and non-injected, sclera anicteric OROPHARYNX: MMM, no exudates, no oropharyngeal erythema or ulceration NECK: supple, no JVD LYMPH:  no palpable lymphadenopathy in the cervical, axillary or inguinal regions LUNGS: clear to auscultation b/l with normal respiratory effort HEART: regular rate & rhythm ABDOMEN:  normoactive bowel sounds , non tender, not distended. No palpable hepatosplenomegaly.  Extremity: no pedal edema PSYCH: alert & oriented x 3 with fluent speech NEURO: no focal motor/sensory deficits   LABORATORY DATA:  I have reviewed the data as listed  . CBC Latest Ref Rng & Units 11/01/2018 08/02/2018 06/06/2018  WBC 4.0 - 10.5 K/uL 2.8(L) 2.2(L) 2.8(L)  Hemoglobin 12.0 - 15.0 g/dL 10.2(L) 10.4(L)  9.7(L)  Hematocrit 36.0 - 46.0 % 32.0(L) 32.3(L) 29.9(L)  Platelets 150 - 400 K/uL 190 158 172  ANC 1.3k . CMP Latest Ref Rng & Units 11/01/2018 08/02/2018 06/02/2018  Glucose 70 - 99 mg/dL 87 85 85  BUN 8 - 23 mg/dL 15 14 15   Creatinine  0.44 - 1.00 mg/dL 1.06(H) 0.97 1.00  Sodium 135 - 145 mmol/L 142 142 140  Potassium 3.5 - 5.1 mmol/L 4.7 4.3 4.0  Chloride 98 - 111 mmol/L 106 106 106  CO2 22 - 32 mmol/L 28 28 25   Calcium 8.9 - 10.3 mg/dL 9.2 9.5 9.1  Total Protein 6.5 - 8.1 g/dL 7.9 8.0 8.0  Total Bilirubin 0.3 - 1.2 mg/dL 0.4 0.5 0.6  Alkaline Phos 38 - 126 U/L 59 65 54  AST 15 - 41 U/L 20 20 16   ALT 0 - 44 U/L 9 9 6         RADIOGRAPHIC STUDIES: I have personally reviewed the radiological images as listed and agreed with the findings in the report. No results found.  ASSESSMENT & PLAN:   85 y.o. female   1) Normocytic Anemia likely multifactorial  hgb improved from 8.1 to 9.5 Likely MDS + blood loss from uterine high grade papillary uterine carcinoma with vaginal bleeding + Anemia of chronic disease related to newly diagnosed malignancy.  Slightly elevated LDH level at 297 but normal haptoglobin suggesting against acute hemolysis.   Homocystine is elevated though B12 level is within normal limits and RBC folate- WNL  10/28/17 MM panel showed no M-Protein, with elevated IgG and IgM. Kappa and lambda light chain were both elevated- likely reactive to newly diagnosed malignancy. No overt evidence of GI bleeding.  No evidence of iron deficiency. Vaginal bleeding has resolved for now Cannot rule out MDS given the patient's advanced age.  2) leukopenia 2.2k with mild neutropenia 1.3k  3)hypothyroidism  -on levothyroxine replacement on 43mcg po daily.  4) Elevated LDH levels -no overt evidence of hemolysis given normal haptoglobin level and no reticulocytosis and normal bilirubin levels.  Likely from uterine papillary carcinoma 5) Positive direct Coombs test - no evidence  of overt hemolysis at this time.  6) Recently diagnosed high grade papillary uterine carcinoma . S/p completion of palliative RT. Deemed not a good candidate for chemotherapy. Currently with resolution of vaginal bleeding. Initial CT with no evidence of metastatic disease  PLAN  -Discussed pt labwork today, 11/01/18; WBC improved to 2.8k, HGB stable at 10.2, PLT normal at 190k. 22% saturation ratio.  -Will check TSH at next visit. -Advised that the pt continue to eat well and stay hydrated -Will repeat CT scan prior to next visit  -Pt has finished palliative radiation with resolution of vaginal bleeding. -Continue 2mg  Dexamethasone for appetite stimulation -Continue thyroid replacement would not decrease dose even if TSH WNL since patient had cognitive issues with previous attempt at thyroid dose reduction. -Will refer the pt to a home health evaluation  -Will see the pt back in 2 months   5) . Patient Active Problem List   Diagnosis Date Noted  . Uterine carcinoma ([place]) 01/31/2018  . Syncope 09/28/2013  . Altered mental status 09/26/2013  . Syncope and collapse 09/26/2013  . Hypothyroidism 09/26/2013  . Depression 09/26/2013  . HTN (hypertension) 09/26/2013  Plan -continue f/u with PCP   CT abd/pelvis in 7 weeks RTC with Dr Irene Limbo in 8 weeks with labs Referral to [facility] home [facility] for home safety evaluation/PT/PCA   The total time spent in the appt was 25 minutes and more than 50% was on counseling and direct patient cares.    Sullivan Lone MD [place] AAHIVMS [facility] Hematology/Oncology Physician [facility]  (Office):       [phone] (Work cell):  [phone] (Fax):  [phone]   I, Baldwin [place], am acting as a scribe for Dr. Sullivan Lone.   .I have reviewed the above documentation for accuracy and completeness, and I agree with the above. Brunetta Genera MD

## 2018-11-01 ENCOUNTER — Telehealth: Payer: Self-pay

## 2018-11-01 ENCOUNTER — Inpatient Hospital Stay: Payer: Medicare Other | Attending: Hematology

## 2018-11-01 ENCOUNTER — Inpatient Hospital Stay (HOSPITAL_BASED_OUTPATIENT_CLINIC_OR_DEPARTMENT_OTHER): Payer: Medicare Other | Admitting: Hematology

## 2018-11-01 VITALS — BP 155/76 | HR 67 | Temp 98.3°F | Resp 18 | Ht 63.0 in | Wt 134.5 lb

## 2018-11-01 DIAGNOSIS — I10 Essential (primary) hypertension: Secondary | ICD-10-CM

## 2018-11-01 DIAGNOSIS — R740 Nonspecific elevation of levels of transaminase and lactic acid dehydrogenase [LDH]: Secondary | ICD-10-CM | POA: Insufficient documentation

## 2018-11-01 DIAGNOSIS — D649 Anemia, unspecified: Secondary | ICD-10-CM | POA: Insufficient documentation

## 2018-11-01 DIAGNOSIS — D72819 Decreased white blood cell count, unspecified: Secondary | ICD-10-CM | POA: Insufficient documentation

## 2018-11-01 DIAGNOSIS — C55 Malignant neoplasm of uterus, part unspecified: Secondary | ICD-10-CM

## 2018-11-01 DIAGNOSIS — E039 Hypothyroidism, unspecified: Secondary | ICD-10-CM

## 2018-11-01 DIAGNOSIS — Z923 Personal history of irradiation: Secondary | ICD-10-CM

## 2018-11-01 DIAGNOSIS — Z7982 Long term (current) use of aspirin: Secondary | ICD-10-CM

## 2018-11-01 DIAGNOSIS — Z79899 Other long term (current) drug therapy: Secondary | ICD-10-CM | POA: Diagnosis not present

## 2018-11-01 DIAGNOSIS — D509 Iron deficiency anemia, unspecified: Secondary | ICD-10-CM

## 2018-11-01 LAB — CMP (CANCER CENTER ONLY)
ALBUMIN: 3.7 g/dL (ref 3.5–5.0)
ALT: 9 U/L (ref 0–44)
ANION GAP: 8 (ref 5–15)
AST: 20 U/L (ref 15–41)
Alkaline Phosphatase: 59 U/L (ref 38–126)
BILIRUBIN TOTAL: 0.4 mg/dL (ref 0.3–1.2)
BUN: 15 mg/dL (ref 8–23)
CHLORIDE: 106 mmol/L (ref 98–111)
CO2: 28 mmol/L (ref 22–32)
Calcium: 9.2 mg/dL (ref 8.9–10.3)
Creatinine: 1.06 mg/dL — ABNORMAL HIGH (ref 0.44–1.00)
GFR, Est AFR Am: 50 mL/min — ABNORMAL LOW (ref >60)
GFR, Estimated: 43 mL/min — ABNORMAL LOW (ref >60)
GLUCOSE: 87 mg/dL (ref 70–99)
Potassium: 4.7 mmol/L (ref 3.5–5.1)
SODIUM: 142 mmol/L (ref 135–145)
TOTAL PROTEIN: 7.9 g/dL (ref 6.5–8.1)

## 2018-11-01 LAB — CBC WITH DIFFERENTIAL/PLATELET
ABS IMMATURE GRANULOCYTES: 0.03 10*3/uL (ref 0.00–0.07)
Basophils Absolute: 0 10*3/uL (ref 0.0–0.1)
Basophils Relative: 0 %
Eosinophils Absolute: 0 10*3/uL (ref 0.0–0.5)
Eosinophils Relative: 1 %
HCT: 32 % — ABNORMAL LOW (ref 36.0–46.0)
HEMOGLOBIN: 10.2 g/dL — AB (ref 12.0–15.0)
IMMATURE GRANULOCYTES: 1 %
LYMPHS ABS: 0.4 10*3/uL — AB (ref 0.7–4.0)
LYMPHS PCT: 15 %
MCH: 29.7 pg (ref 26.0–34.0)
MCHC: 31.9 g/dL (ref 30.0–36.0)
MCV: 93.3 fL (ref 80.0–100.0)
MONO ABS: 0.4 10*3/uL (ref 0.1–1.0)
Monocytes Relative: 13 %
NEUTROS ABS: 1.9 10*3/uL (ref 1.7–7.7)
NEUTROS PCT: 70 %
PLATELETS: 190 10*3/uL (ref 150–400)
RBC: 3.43 MIL/uL — ABNORMAL LOW (ref 3.87–5.11)
RDW: 17 % — ABNORMAL HIGH (ref 11.5–15.5)
WBC: 2.8 10*3/uL — ABNORMAL LOW (ref 4.0–10.5)
nRBC: 0 % (ref 0.0–0.2)

## 2018-11-01 LAB — IRON AND TIBC
Iron: 54 ug/dL (ref 41–142)
Saturation Ratios: 22 % (ref 21–57)
TIBC: 249 ug/dL (ref 236–444)
UIBC: 195 ug/dL (ref 120–384)

## 2018-11-01 LAB — FERRITIN: Ferritin: 651 ng/mL — ABNORMAL HIGH (ref 11–307)

## 2018-11-01 MED ORDER — LEVOTHYROXINE SODIUM 75 MCG PO TABS: 75.0000 ug | ORAL_TABLET | Freq: Every day | ORAL | 3 refills | Status: DC

## 2018-11-01 NOTE — Patient Instructions (Signed)
Thank you for choosing [facility] to provide your oncology and hematology care.  To afford each patient quality time with our providers, please arrive 30 minutes before your scheduled appointment time.  If you arrive late for your appointment, you may be asked to reschedule.  We strive to give you quality time with our providers, and arriving late affects you and other patients whose appointments are after yours.    If you are a no show for multiple scheduled visits, you may be dismissed from the clinic at the providers discretion.     Again, thank you for choosing [facility], our hope is that these requests will decrease the amount of time that you wait before being seen by our physicians.  ______________________________________________________________________   Should you have questions after your visit to the [facility], please contact our office at [phone] between the hours of 8:30 and 4:30 p.m.    Voicemails left after 4:30p.m will not be returned until the following business day.     For prescription refill requests, please have your pharmacy contact us directly.  Please also try to allow 48 hours for prescription requests.     Please contact the scheduling department for questions regarding scheduling.  For scheduling of procedures such as PET scans, CT scans, MRI, Ultrasound, etc please contact central scheduling at [phone].     Resources For Cancer Patients and Caregivers:    Oncolink.org:  A wonderful resource for patients and healthcare providers for information regarding your disease, ways to tract your treatment, what to expect, etc.      American Cancer Society:  800-227-2345  Can help patients locate various types of support and financial assistance   Cancer Care: 1-800-813-HOPE (4673) Provides financial assistance, online support groups, medication/co-pay assistance.     [place] DSS:  336-641-3447 Where to apply  for food stamps, Medicaid, and utility assistance   Medicare Rights Center: 800-333-4114 Helps people with Medicare understand their rights and benefits, navigate the Medicare system, and secure the quality healthcare they deserve   SCAT: 336-333-6589 [place] [employer]'s shared-ride transportation service for eligible riders who have a disability that prevents them from riding the fixed route bus.     For additional information on assistance programs please contact our social worker:   Abigail Elmore:  [phone]  

## 2018-11-01 NOTE — Telephone Encounter (Signed)
Printed avs and calender of upcoming appointment. Per 1/14 los 

## 2018-11-28 ENCOUNTER — Telehealth: Payer: Self-pay | Admitting: Radiation Oncology

## 2018-11-28 ENCOUNTER — Ambulatory Visit: Admission: RE | Admit: 2018-11-28 | Payer: Medicare Other | Source: Ambulatory Visit | Admitting: Radiation Oncology

## 2018-11-28 NOTE — Telephone Encounter (Signed)
Patient has not shown for 1600 appointment. Phoned daughter, [name], to inquire. [name] reports she was unaware of the appointment and request our schedulers phone her back to reschedule. Informed Dr. Sondra Come and his nurse, Karna Dupes of this finding.

## 2018-11-29 ENCOUNTER — Telehealth: Payer: Self-pay | Admitting: *Deleted

## 2018-11-29 NOTE — Telephone Encounter (Signed)
CALLED PATIENT TO RESCHEDULE FU APPT. FROM 11-28-18, RESCHEDULED FOR 01-05-19 @ 10:30 AM, SPOKE WITH PATIENT'S [facility] AND SHE AGREED TO THIS DAY AND TIME

## 2018-12-23 ENCOUNTER — Telehealth: Payer: Self-pay | Admitting: Oncology

## 2018-12-23 ENCOUNTER — Inpatient Hospital Stay: Payer: Medicare Other

## 2018-12-23 NOTE — Telephone Encounter (Signed)
Patient's daughter left a message asking when her CT will be scheduled.  She said they have the contrast but have not been contacted about when it will be.  She also said Mystique is weaker, walking slower, has a poor appetite and has a dull, constant pain in her stomach.  She is wondering what to do.  Scheduled CT scan for 12/26/18 at 3 pm with labs at 2pm.  Called her back and notified her of lab and CT appointment and instructions (NPO 4 hours before, drink contrast a 1 and 2).  Also confirmed appointment to see [name] on 12/27/18.  She verbalized agreement.

## 2018-12-26 ENCOUNTER — Inpatient Hospital Stay: Payer: Medicare Other | Attending: Hematology

## 2018-12-26 ENCOUNTER — Ambulatory Visit (HOSPITAL_COMMUNITY)
Admission: RE | Admit: 2018-12-26 | Discharge: 2018-12-26 | Disposition: A | Discharge status: Home / Self Care | Payer: Medicare Other | Source: Ambulatory Visit | Attending: Hematology | Admitting: Hematology

## 2018-12-26 DIAGNOSIS — D649 Anemia, unspecified: Secondary | ICD-10-CM | POA: Insufficient documentation

## 2018-12-26 DIAGNOSIS — Z7982 Long term (current) use of aspirin: Secondary | ICD-10-CM | POA: Insufficient documentation

## 2018-12-26 DIAGNOSIS — C55 Malignant neoplasm of uterus, part unspecified: Secondary | ICD-10-CM

## 2018-12-26 DIAGNOSIS — Z923 Personal history of irradiation: Secondary | ICD-10-CM | POA: Insufficient documentation

## 2018-12-26 DIAGNOSIS — F419 Anxiety disorder, unspecified: Secondary | ICD-10-CM | POA: Insufficient documentation

## 2018-12-26 DIAGNOSIS — Z79899 Other long term (current) drug therapy: Secondary | ICD-10-CM | POA: Diagnosis not present

## 2018-12-26 DIAGNOSIS — I10 Essential (primary) hypertension: Secondary | ICD-10-CM | POA: Diagnosis not present

## 2018-12-26 DIAGNOSIS — D72819 Decreased white blood cell count, unspecified: Secondary | ICD-10-CM | POA: Diagnosis not present

## 2018-12-26 DIAGNOSIS — E039 Hypothyroidism, unspecified: Secondary | ICD-10-CM | POA: Diagnosis not present

## 2018-12-26 LAB — CBC WITH DIFFERENTIAL/PLATELET
Abs Immature Granulocytes: 0.03 10*3/uL (ref 0.00–0.07)
BASOS PCT: 0 %
Basophils Absolute: 0 10*3/uL (ref 0.0–0.1)
EOS ABS: 0 10*3/uL (ref 0.0–0.5)
Eosinophils Relative: 1 %
HEMATOCRIT: 33.8 % — AB (ref 36.0–46.0)
Hemoglobin: 10.7 g/dL — ABNORMAL LOW (ref 12.0–15.0)
IMMATURE GRANULOCYTES: 1 %
LYMPHS ABS: 0.5 10*3/uL — AB (ref 0.7–4.0)
Lymphocytes Relative: 17 %
MCH: 29.5 pg (ref 26.0–34.0)
MCHC: 31.7 g/dL (ref 30.0–36.0)
MCV: 93.1 fL (ref 80.0–100.0)
MONO ABS: 0.3 10*3/uL (ref 0.1–1.0)
Monocytes Relative: 10 %
NEUTROS PCT: 71 %
Neutro Abs: 2.1 10*3/uL (ref 1.7–7.7)
Platelets: 201 10*3/uL (ref 150–400)
RBC: 3.63 MIL/uL — ABNORMAL LOW (ref 3.87–5.11)
RDW: 16.9 % — AB (ref 11.5–15.5)
WBC: 2.9 10*3/uL — ABNORMAL LOW (ref 4.0–10.5)
nRBC: 0 % (ref 0.0–0.2)

## 2018-12-26 LAB — CMP (CANCER CENTER ONLY)
ALBUMIN: 3.8 g/dL (ref 3.5–5.0)
ALK PHOS: 60 U/L (ref 38–126)
ALT: 7 U/L (ref 0–44)
ANION GAP: 11 (ref 5–15)
AST: 20 U/L (ref 15–41)
BILIRUBIN TOTAL: 0.4 mg/dL (ref 0.3–1.2)
BUN: 13 mg/dL (ref 8–23)
CALCIUM: 9.5 mg/dL (ref 8.9–10.3)
CO2: 27 mmol/L (ref 22–32)
Chloride: 103 mmol/L (ref 98–111)
Creatinine: 1.07 mg/dL — ABNORMAL HIGH (ref 0.44–1.00)
GFR, Est AFR Am: 50 mL/min — ABNORMAL LOW (ref >60)
GFR, Estimated: 43 mL/min — ABNORMAL LOW (ref >60)
GLUCOSE: 89 mg/dL (ref 70–99)
Potassium: 4.5 mmol/L (ref 3.5–5.1)
Sodium: 141 mmol/L (ref 135–145)
TOTAL PROTEIN: 8.3 g/dL — AB (ref 6.5–8.1)

## 2018-12-26 LAB — TSH: TSH: 1.808 u[IU]/mL (ref 0.308–3.960)

## 2018-12-26 MED ORDER — SODIUM CHLORIDE (PF) 0.9 % IJ SOLN
INTRAMUSCULAR | Status: AC
  Filled 2018-12-26: qty 50

## 2018-12-26 MED ORDER — IOHEXOL 300 MG/ML  SOLN
100.0000 mL | Freq: Once | INTRAMUSCULAR | Status: AC | PRN
  Administered 2018-12-26: 80 mL via INTRAVENOUS

## 2018-12-26 NOTE — Progress Notes (Signed)
HEMATOLOGY ONCOLOGY CLINIC NOTE  Date of Service: 12/27/18   Patient Care Team: Lajean Manes, MD as PCP - General (Internal Medicine)  CHIEF COMPLAINTS/PURPOSE OF CONSULTATION:  Anemia F/u for recently diagnosed uterine papillary carcinoma.  HISTORY OF PRESENTING ILLNESS:   URI Wendy [name] 85 y.o. female is here because of worsening anemia.  Patient has a history of hypertension and hypothyroidism who as per her daughter has been a very active person until recently. Her daughter notes that patient's levothyroxine dose was reduced from 75 mcg to 50 mcg daily and this produced a significant change in the patient's cognition and energy levels.  She reports that it is very unusual for the patient to wake up late and then sleep throughout the day as she is currently doing. Per the patient's daughter she was a very active person, who would do a lot of walking, including walking around the track.   She notes that the patient has been slowly declining over the past year ever since seeing a new provider who started decreasing her thyroid medication. The patient's appetite has been declining as well.    She was seen in the ED for two syncopal episodes. On 09/04/2018 .  She was noted to be hypotensive and hypoxic.  Symptoms were thought to be related to dehydration due to poor oral intake.  Echo showed normal ejection fraction of 60-65% with grade 2 diastolic dysfunction.  Her losartan dose was significantly reduced on discharge.  She was noted to have several episodes of nonsustained VT but her heart rate was in the 50s.  Hemoglobin was noted to be between 7 and 7.5 but transfusion was withheld due to presence of positive Coombs test and outpatient hematology consult was recommended. FOBT was never sent.  She was found to have abnormal CBC from 09/07/2017 -with hemoglobin of 7 MCV of 95, WBC count of 3k with an ANC of 2.3k.  And a platelet count of 175k.  Reticulocyte percentage of 1.9,  absolute reticulocyte count of 45.8k/ul .  She was noted to have a positive Coombs test but her LDH and haptoglobin levels were within normal limits suggesting against overt hemolysis.  ANA negative. Sedimentation rate 138.  CRP within normal limits. Free T4 0.87 Ferritin 857 with 23% iron saturation. Folate 19.5 B12 - 470  She denies recent chest pain on exertion, shortness of breath on minimal exertion, or palpitations. She denies cough. She had not noticed any recent bleeding such as epistaxis, hematuria or hematochezia.   The patient denies over the counter NSAID ingestion. She is not on antiplatelets agents.   She had no prior history or diagnosis of cancer. Her age appropriate screening programs are up-to-date.  She denies any pica and eats a variety of diet.  She never donated blood or received blood transfusion.   Some days her appetite is better than others. The patients daughter states she has also become more forgetful at times.  Pt denies fever, chills, trouble swelling, abdominal pain, or any other symptoms or complaints at this time.  Patient really knows that she still feels somewhat fatigued but has no overt lightheadedness or dizziness at this time and feels better than when she was admitted. No overt infectious issues. No overt evidence of bleeding.  Some unquantified weight loss due to decreased appetite. No other overt focal symptoms.  Interval History:   Wendy [name] is here regarding her borderline macrocytic anemia  and leucopenia and her recently diagnosed uterine high grade papillary caricnoma. The  patient's last visit with [place] was on 11/01/18. She is accompanied today by her daughter and son. The pt reports that she is doing well overall.   The pt reports that she has had some central abdominal pain occasionally but not recently, and has been moving her bowels relatively better than her baseline which includes general constipation. She has described the  abdominal pain as a "low, dull pain." The pt's daughter notes that the pt has been eating a little less.  She denies vaginal bleeding, changes in urination, and changes in breathing. However, the pt's daughter notes that the pt has mentioned an instance of vaginal bleeding in the interim. The pt notes that she "has been doing great." However, the pt's daughter notes that the pt has "been sleepy a lot."  Of note since the patient's last visit, pt has had a CT A/P completed on 12/26/18 with results revealing New solitary liver metastasis in left hepatic lobe. 2. New small right middle and lower lobe pulmonary metastases. 3. Increased low-attenuation throughout the endometrial cavity of uterus, which could be due to recurrent endometrial carcinoma or benign post treatment hydrometros. 4. Increased soft tissue prominence in left adnexa, raising suspicion for local extra-uterine tumor.  Lab results (12/26/18) of CBC w/diff and CMP is as follows: all values are WNL except for WBC at 2.9k, RBC at 3.63, HGB at 10.7, HCT at 33.8, RDW at 16.9, Lymphs abs at 500, Creatinine at 1.07, Total Protein at 8.3, GFR at 50.  On review of systems, pt reports stable energy levels, occasional central abdominal pain, some constipation, and denies vaginal bleeding, shortness of breath, changes in urination, and any other symptoms.   MEDICAL HISTORY:  Past Medical History:  Diagnosis Date  . History of radiation therapy 02/21/2018-03/31/2018   Uterus, pelvis1.8 Gy in 25 fractions for a total dose of 45 Gy     . Hypertension   . Thyroid disease   Nonsustained VT Hypothyroidism Acute kidney injury 08/2017 Depression-on Celexa.  SURGICAL HISTORY: Past Surgical History:  Procedure Laterality Date  . CORONARY ANGIOPLASTY      SOCIAL HISTORY: Social History   Socioeconomic History  . Marital status: Widowed    Spouse name: Not on file  . Number of children: Not on file  . Years of education: Not on file  . Highest  education level: Not on file  Occupational History  . Not on file  Social Needs  . Financial resource strain: Not on file  . Food insecurity:    Worry: Not on file    Inability: Not on file  . Transportation needs:    Medical: Not on file    Non-medical: Not on file  Tobacco Use  . Smoking status: Never Smoker  . Smokeless tobacco: Never Used  Substance and Sexual Activity  . Alcohol use: No  . Drug use: No  . Sexual activity: Never  Lifestyle  . Physical activity:    Days per week: Not on file    Minutes per session: Not on file  . Stress: Not on file  Relationships  . Social connections:    Talks on phone: Not on file    Gets together: Not on file    Attends religious service: Not on file    Active member of club or organization: Not on file    Attends meetings of clubs or organizations: Not on file    Relationship status: Not on file  . Intimate partner violence:    Fear of  current or ex partner: Not on file    Emotionally abused: Not on file    Physically abused: Not on file    Forced sexual activity: Not on file  Other Topics Concern  . Not on file  Social History Narrative  . Not on file    FAMILY HISTORY: No family history on file.  ALLERGIES:  has No Known Allergies.  MEDICATIONS:  Current Outpatient Medications  Medication Sig Dispense Refill  . aspirin EC 81 MG tablet Take 81 mg daily after supper by mouth.     . citalopram (CELEXA) 10 MG tablet Take 5 mg daily after supper by mouth.     . dexamethasone (DECADRON) 2 MG tablet Take 1 tablet (2 mg total) by mouth daily after breakfast. (Patient not taking: Reported on 08/01/2018) 30 tablet 1  . ENSURE (ENSURE) Take 237 mLs by mouth 2 (two) times daily between meals.    . hydrocortisone cream 1 % Apply to affected area 2 times daily 15 g 0  . Iron Combinations (NIFEREX) TABS TAKE 1 TABLET BY MOUTH DAILY (Patient not taking: Reported on 08/01/2018) 30 tablet 0  . iron polysaccharides (NIFEREX) 150 MG  capsule Take 1 capsule (150 mg total) by mouth daily. (Patient not taking: Reported on 08/01/2018) 30 capsule 0  . levothyroxine (SYNTHROID, LEVOTHROID) 75 MCG tablet Take 1 tablet (75 mcg total) by mouth daily before breakfast. 30 tablet 3  . losartan (COZAAR) 50 MG tablet Take 50 mg by mouth daily.    . pantoprazole (PROTONIX) 40 MG tablet Take 1 tablet (40 mg total) by mouth daily. (Patient not taking: Reported on 08/01/2018) 30 tablet 5  . triamcinolone (KENALOG) 0.025 % cream Apply 1 application 2 (two) times daily as needed topically (rash).    . Vitamin D, Ergocalciferol, (DRISDOL) 50000 units CAPS capsule Take 50,000 Units every 7 (seven) days by mouth.     No current facility-administered medications for this visit.     REVIEW OF SYSTEMS:    A 10+ POINT REVIEW OF SYSTEMS WAS OBTAINED including neurology, dermatology, psychiatry, cardiac, respiratory, lymph, extremities, GI, GU, Musculoskeletal, constitutional, breasts, reproductive, HEENT.  All pertinent positives are noted in the HPI.  All others are negative.  PHYSICAL EXAMINATION: ECOG PERFORMANCE STATUS: 2 - Symptomatic, <50% confined to bed  Vitals:   12/27/18 1514  BP: (!) 148/70  Pulse: 72  Resp: 18  Temp: 98.8 F (37.1 C)  SpO2: 99%   Filed Weights   12/27/18 1514  Weight: 134 lb 11.2 oz (61.1 kg)     GENERAL:alert, in no acute distress and comfortable SKIN: no acute rashes, no significant lesions EYES: conjunctiva are pink and non-injected, sclera anicteric OROPHARYNX: MMM, no exudates, no oropharyngeal erythema or ulceration NECK: supple, no JVD LYMPH:  no palpable lymphadenopathy in the cervical, axillary or inguinal regions LUNGS: clear to auscultation b/l with normal respiratory effort HEART: regular rate & rhythm ABDOMEN:  normoactive bowel sounds , non tender, not distended. No palpable hepatosplenomegaly.  Extremity: no pedal edema PSYCH: alert & oriented x 3 with fluent speech NEURO: no focal  motor/sensory deficits   LABORATORY DATA:  I have reviewed the data as listed  . CBC Latest Ref Rng & Units 12/26/2018 11/01/2018 08/02/2018  WBC 4.0 - 10.5 K/uL 2.9(L) 2.8(L) 2.2(L)  Hemoglobin 12.0 - 15.0 g/dL 10.7(L) 10.2(L) 10.4(L)  Hematocrit 36.0 - 46.0 % 33.8(L) 32.0(L) 32.3(L)  Platelets 150 - 400 K/uL 201 190 158  ANC 1.3k . CMP Latest Ref Rng &  Units 12/26/2018 11/01/2018 08/02/2018  Glucose 70 - 99 mg/dL 89 87 85  BUN 8 - 23 mg/dL 13 15 14   Creatinine 0.44 - 1.00 mg/dL 1.07(H) 1.06(H) 0.97  Sodium 135 - 145 mmol/L 141 142 142  Potassium 3.5 - 5.1 mmol/L 4.5 4.7 4.3  Chloride 98 - 111 mmol/L 103 106 106  CO2 22 - 32 mmol/L 27 28 28   Calcium 8.9 - 10.3 mg/dL 9.5 9.2 9.5  Total Protein 6.5 - 8.1 g/dL 8.3(H) 7.9 8.0  Total Bilirubin 0.3 - 1.2 mg/dL 0.4 0.4 0.5  Alkaline Phos 38 - 126 U/L 60 59 65  AST 15 - 41 U/L 20 20 20   ALT 0 - 44 U/L 7 9 9         RADIOGRAPHIC STUDIES: I have personally reviewed the radiological images as listed and agreed with the findings in the report. Ct Abdomen Pelvis W Contrast  Result Date: 12/27/2018 CLINICAL DATA:  Followup uterine carcinoma. Status post palliative radiation therapy. EXAM: CT ABDOMEN AND PELVIS WITH CONTRAST TECHNIQUE: Multidetector CT imaging of the abdomen and pelvis was performed using the standard protocol following bolus administration of intravenous contrast. CONTRAST:  32mL OMNIPAQUE IOHEXOL 300 MG/ML  SOLN COMPARISON:  01/21/2018 FINDINGS: Lower Chest: 2 new pulmonary nodules are seen in the right middle and lower lobes, each measuring 9 mm. These are consistent with pulmonary metastases. Hepatobiliary: New hypovascular mass is seen in segment 3 of the left lobe which measures 3.4 x 2.9 cm and is consistent with liver metastasis. No other liver masses are identified. A few tiny sub-cm cysts remain stable. Gallbladder is unremarkable. Pancreas:  No mass or inflammatory changes. Spleen: Within normal limits in size and  appearance. Adrenals/Urinary Tract: Normal adrenal glands. Small bilateral renal cysts are again seen, however there is no evidence of renal masses or hydronephrosis. Unremarkable unopacified urinary bladder. Stomach/Bowel: No evidence of obstruction, inflammatory process or abnormal fluid collections. Diverticulosis is seen mainly involving the ascending and sigmoid portions of the colon, as well as the distal ileum, however however there is no evidence of diverticulitis. Mild perirectal and presacral soft tissue stranding is stable and consistent with post radiation changes. Vascular/Lymphatic: No pathologically enlarged lymph nodes. No abdominal aortic aneurysm. Aortic atherosclerosis. Reproductive: Increased low-attenuation is seen distending the endometrial cavity of the uterus since previous study. This could be due to recurrent endometrial carcinoma or benign post treatment hydrometros. Increased soft tissue prominence is seen in the left adnexa measuring approximately 3.1 x 2.3 cm, raising suspicion for local extra-uterine extension of tumor. No evidence of peritoneal thickening or ascites. Other:  None. Musculoskeletal:  No suspicious bone lesions identified. IMPRESSION: 1. New solitary liver metastasis in left hepatic lobe. 2. New small right middle and lower lobe pulmonary metastases. 3. Increased low-attenuation throughout the endometrial cavity of uterus, which could be due to recurrent endometrial carcinoma or benign post treatment hydrometros. 4. Increased soft tissue prominence in left adnexa, raising suspicion for local extra-uterine tumor. Electronically Signed   By: Earle Gell M.D.   On: 12/27/2018 09:23    ASSESSMENT & PLAN:   85 y.o. female   1) Normocytic Anemia likely multifactorial  hgb improved from 8.1 to 9.5 Likely MDS + blood loss from uterine high grade papillary uterine carcinoma with vaginal bleeding + Anemia of chronic disease related to newly diagnosed  malignancy.  Slightly elevated LDH level at 297 but normal haptoglobin suggesting against acute hemolysis.   Homocystine is elevated though B12 level is within normal limits  and RBC folate- WNL  10/28/17 MM panel showed no M-Protein, with elevated IgG and IgM. Kappa and lambda light chain were both elevated- likely reactive to newly diagnosed malignancy. No overt evidence of GI bleeding.  No evidence of iron deficiency. Vaginal bleeding has resolved for now Cannot rule out MDS given the patient's advanced age.  2) leukopenia 2.2k with mild neutropenia 1.3k  3)hypothyroidism  -on levothyroxine replacement on 21mcg po daily.  4) Elevated LDH levels -no overt evidence of hemolysis given normal haptoglobin level and no reticulocytosis and normal bilirubin levels.  Likely from uterine papillary carcinoma 5) Positive direct Coombs test - no evidence of overt hemolysis at this time.  6) Recently diagnosed high grade papillary uterine carcinoma . S/p completion of palliative RT. Deemed not a good candidate for chemotherapy. Currently with resolution of vaginal bleeding. Initial CT with no evidence of metastatic disease  PLAN  -Discussed pt labwork from 12/26/18; HGB slightly improved to 10.7, WBC improved to 2.9k, PLT normal at 201k. Chemistries are stable. -Discussed the 12/26/18 CT A/P which revealed New solitary liver metastasis in left hepatic lobe. 2. New small right middle and lower lobe pulmonary metastases. 3. Increased low-attenuation throughout the endometrial cavity of uterus, which could be due to recurrent endometrial carcinoma or benign post treatment hydrometros. 4. Increased soft tissue prominence in left adnexa, raising suspicion for local extra-uterine tumor. -Discussed that the CT imaging reveals progression -Discussed that I do not recommend palliative chemotherapy given the patient's age and ability to tolerate chemotherapy -Discussed that I recommend palliative care and the  possibility of palliative RT for symptomatic relief if necessary -Pt has finished palliative radiation with resolution of vaginal bleeding. -Continue thyroid replacement would not decrease dose even if TSH WNL since patient had cognitive issues with previous attempt at thyroid dose reduction. -Will refer the pt to a home health evaluation  -Will see the pt back in 6 weeks  5) . Patient Active Problem List   Diagnosis Date Noted  . Uterine carcinoma ([place]) 01/31/2018  . Syncope 09/28/2013  . Altered mental status 09/26/2013  . Syncope and collapse 09/26/2013  . Hypothyroidism 09/26/2013  . Depression 09/26/2013  . HTN (hypertension) 09/26/2013  Plan -continue f/u with PCP   RTC with Dr Irene Limbo with labs in 6 weeks   The total time spent in the appt was 30 minutes and more than 50% was on counseling and direct patient cares.   Sullivan Lone MD [place] AAHIVMS [facility] Hematology/Oncology Physician [facility]  (Office):       [phone] (Work cell):  [phone] (Fax):           [phone]   I, Baldwin [place], am acting as a scribe for Dr. Sullivan Lone.   .I have reviewed the above documentation for accuracy and completeness, and I agree with the above. Brunetta Genera MD

## 2018-12-27 ENCOUNTER — Other Ambulatory Visit: Payer: Medicare Other

## 2018-12-27 ENCOUNTER — Inpatient Hospital Stay (HOSPITAL_BASED_OUTPATIENT_CLINIC_OR_DEPARTMENT_OTHER): Payer: Medicare Other | Admitting: Hematology

## 2018-12-27 VITALS — BP 148/70 | HR 72 | Temp 98.8°F | Resp 18 | Ht 63.0 in | Wt 134.7 lb

## 2018-12-27 DIAGNOSIS — D72819 Decreased white blood cell count, unspecified: Secondary | ICD-10-CM | POA: Diagnosis not present

## 2018-12-27 DIAGNOSIS — Z79899 Other long term (current) drug therapy: Secondary | ICD-10-CM

## 2018-12-27 DIAGNOSIS — C55 Malignant neoplasm of uterus, part unspecified: Secondary | ICD-10-CM

## 2018-12-27 DIAGNOSIS — Z923 Personal history of irradiation: Secondary | ICD-10-CM

## 2018-12-27 DIAGNOSIS — C7800 Secondary malignant neoplasm of unspecified lung: Secondary | ICD-10-CM

## 2018-12-27 DIAGNOSIS — I10 Essential (primary) hypertension: Secondary | ICD-10-CM | POA: Diagnosis not present

## 2018-12-27 DIAGNOSIS — C787 Secondary malignant neoplasm of liver and intrahepatic bile duct: Secondary | ICD-10-CM

## 2018-12-27 DIAGNOSIS — D649 Anemia, unspecified: Secondary | ICD-10-CM

## 2018-12-27 DIAGNOSIS — Z7982 Long term (current) use of aspirin: Secondary | ICD-10-CM

## 2018-12-27 DIAGNOSIS — F419 Anxiety disorder, unspecified: Secondary | ICD-10-CM

## 2018-12-27 DIAGNOSIS — E039 Hypothyroidism, unspecified: Secondary | ICD-10-CM

## 2018-12-28 ENCOUNTER — Telehealth: Payer: Self-pay | Admitting: Hematology

## 2018-12-28 NOTE — Telephone Encounter (Signed)
Called and scheduled appt per 3/10 los.  Patient aware of appt date and time.

## 2019-01-02 ENCOUNTER — Telehealth: Payer: Self-pay | Admitting: *Deleted

## 2019-01-02 NOTE — Telephone Encounter (Signed)
CALLED PATIENT'S DAUGHTER- DEBORAH WITCHEY TO ALTER FU APPT. DUE TO DR. KINARD BEING IN THE OR, RESCHEDULED FOR 01-09-19 @ 11:15 AM,LVM FOR A RETURN CALL

## 2019-01-05 ENCOUNTER — Ambulatory Visit: Payer: Medicare Other | Admitting: Radiation Oncology

## 2019-01-06 ENCOUNTER — Telehealth: Payer: Self-pay

## 2019-01-06 NOTE — Telephone Encounter (Signed)
Contacted pt's daughter to convey that Dr. Sondra Come is canceling pt's appt Monday 3/23 at 1130 due to Covid 19 concerns. Daughter very upset and is questioning why pt needs to be canceled with "bad scans". Daughter suggested that they "wait in the car until appt time". Conveyed to daughter that the risk of pt and daughter being exposed to Covid 19 was too great. Daughter requiring much reiteration of risk of Covid 30. This RN conveyed to daughter that Dr. Sondra Come could contact daughter via phone to discuss pt's status and any options. Daughter still conveying that she wants to keep appt. Conveyed to daughter that options could be discussed over the phone and that appt would be canceled for both pt and daughter safety. This RN conveyed to daughter that this RN would contact daughter Monday morning to convey time of telephone call from Dr. Sondra Come. Daughter reluctantly verbalized agreement. Loma Sousa, RN BSN

## 2019-01-09 ENCOUNTER — Ambulatory Visit: Payer: Medicare Other | Admitting: Podiatry

## 2019-01-09 ENCOUNTER — Ambulatory Visit: Payer: Medicare Other | Admitting: Radiation Oncology

## 2019-01-12 ENCOUNTER — Other Ambulatory Visit: Payer: Self-pay

## 2019-01-12 ENCOUNTER — Ambulatory Visit: Payer: Medicare Other | Admitting: Podiatry

## 2019-01-12 ENCOUNTER — Encounter: Payer: Self-pay | Admitting: Podiatry

## 2019-01-12 DIAGNOSIS — M79676 Pain in unspecified toe(s): Secondary | ICD-10-CM

## 2019-01-12 DIAGNOSIS — B351 Tinea unguium: Secondary | ICD-10-CM

## 2019-01-12 NOTE — Progress Notes (Signed)
Complaint:  Visit Type: Patient returns to my office for continued preventative foot care services. Complaint: Patient states" my nails have grown long and thick and become painful to walk and wear shoes"  The patient presents for preventative foot care services. No changes to ROS.  Patient has not been seen in over 11 months.  She presents to the office with her daughter.  Podiatric Exam: Vascular: dorsalis pedis and posterior tibial pulses are palpable bilateral. Capillary return is immediate. Temperature gradient is WNL. Skin turgor WNL  Sensorium: Normal Semmes Weinstein monofilament test. Normal tactile sensation bilaterally. Nail Exam: Pt has thick disfigured discolored nails with subungual debris noted bilateral entire nail hallux through fifth toenails Ulcer Exam: There is no evidence of ulcer or pre-ulcerative changes or infection. Orthopedic Exam: Muscle tone and strength are WNL. No limitations in general ROM. No crepitus or effusions noted. Foot type and digits show no abnormalities.  HAV  B/L with hammer toes second  B/L Skin: No Porokeratosis. No infection or ulcers  Diagnosis:  Onychomycosis, , Pain in right toe, pain in left toes  Treatment & Plan Procedures and Treatment: Consent by patient was obtained for treatment procedures. The patient understood the discussion of treatment and procedures well. All questions were answered thoroughly reviewed. Debridement of mycotic and hypertrophic toenails, 1 through 5 bilateral and clearing of subungual debris. No ulceration, no infection noted.  Return Visit-Office Procedure: Patient instructed to return to the office for a follow up visit 3 months for continued evaluation and treatment.    Gardiner Barefoot DPM

## 2019-01-16 ENCOUNTER — Encounter: Payer: Self-pay | Admitting: Radiation Oncology

## 2019-01-16 ENCOUNTER — Ambulatory Visit
Admission: RE | Admit: 2019-01-16 | Discharge: 2019-01-16 | Disposition: A | Discharge status: Home / Self Care | Payer: Medicare Other | Source: Ambulatory Visit | Attending: Radiation Oncology | Admitting: Radiation Oncology

## 2019-01-16 ENCOUNTER — Telehealth: Payer: Self-pay | Admitting: Oncology

## 2019-01-16 ENCOUNTER — Other Ambulatory Visit: Payer: Self-pay

## 2019-01-16 VITALS — BP 166/62 | HR 65 | Temp 98.8°F | Resp 20 | Ht 63.0 in | Wt 133.4 lb

## 2019-01-16 DIAGNOSIS — C541 Malignant neoplasm of endometrium: Secondary | ICD-10-CM | POA: Insufficient documentation

## 2019-01-16 DIAGNOSIS — C55 Malignant neoplasm of uterus, part unspecified: Secondary | ICD-10-CM | POA: Diagnosis present

## 2019-01-16 DIAGNOSIS — C7800 Secondary malignant neoplasm of unspecified lung: Secondary | ICD-10-CM | POA: Diagnosis not present

## 2019-01-16 DIAGNOSIS — C787 Secondary malignant neoplasm of liver and intrahepatic bile duct: Secondary | ICD-10-CM | POA: Diagnosis not present

## 2019-01-16 DIAGNOSIS — Z79899 Other long term (current) drug therapy: Secondary | ICD-10-CM | POA: Diagnosis not present

## 2019-01-16 DIAGNOSIS — Z79890 Hormone replacement therapy (postmenopausal): Secondary | ICD-10-CM | POA: Insufficient documentation

## 2019-01-16 NOTE — Telephone Encounter (Signed)
Voicemail message left by patient's daughter, [name].  She said patient had a follow up appointment that was canceled due to Covid-19 and that Dr. Sondra Come was going to call them if patient was having issues.  She said her mother is having abdominal pain so she would like to talk with Dr. Sondra Come.  Message forwarded to Dr. Clabe Seal Nurse, Sharee Pimple, RN.

## 2019-01-16 NOTE — Progress Notes (Signed)
Radiation Oncology         [phone]) [phone] ________________________________  Name: Wendy [name] MRN: [id number]  Date: 01/16/2019  DOB: 11-06-1916  Reevaluation Visit Note  CC: Lajean Manes, MD  [name], MD    ICD-10-CM   1. Uterine carcinoma (HCC) C55     Diagnosis:   Recurrent endometrial cancer   Interval Since Last Radiation:  9 months  02/21/18-03/31/18 Pelvis/ 3D/ 45 Gy in 25 fractions  Narrative:  The patient returns today for reevaluation at the courtesy of [name]. She has mild dementia and thus is accompanied by her daughter today despite new COVID-19 visitor restrictions. She has been having some vague abdominal pain as well as problems with vaginal bleeding. She recently underwent reimaging which shows progression of her disease which shows a solitary liver metastasis and two small pulmonary metastases. Also present is progression in the pelvis with left perimetrial soft tissue mass. Radiation has been consulted for consideration for palliative therapy.   Since they were last seen in the office, they had a CT on 12/26/18 which revealed a new solitary liver metastasis in left hepatic lobe. New small right middle and lower lobe pulmonary metastases. Increased low-attenuation throughout the endometrial cavity of uterus, which could be due to recurrent endometrial carcinoma or benign post treatment hydrometros. Increased soft tissue prominence in left adnexa, raising suspicion for local extra-uterine tumor.                 Pertinent positives are listed and detailed within the above HPI. Pt has pain in abdomen, usually in morning and pt does not want eat or drink with this pain. Pt with vaginal bleeding per daughter, much like a period type amount. Pt denies dysuria. Pt denies rectal bleeding, diarrhea. Pt reports occasional constipation. Daughter reports pt c/o occasional abdominal bloating. She has nausea in the mornings but no emesis.                  ALLERGIES:  has No  Known Allergies.  Meds: Current Outpatient Medications  Medication Sig Dispense Refill  . citalopram (CELEXA) 10 MG tablet Take 5 mg daily after supper by mouth.     . ENSURE (ENSURE) Take 237 mLs by mouth 2 (two) times daily between meals.    . hydrocortisone cream 1 % Apply to affected area 2 times daily 15 g 0  . levothyroxine (SYNTHROID, LEVOTHROID) 75 MCG tablet Take 1 tablet (75 mcg total) by mouth daily before breakfast. 30 tablet 3  . losartan (COZAAR) 50 MG tablet Take 50 mg by mouth daily.    Marland Kitchen triamcinolone (KENALOG) 0.025 % cream Apply 1 application 2 (two) times daily as needed topically (rash).    . Vitamin D, Ergocalciferol, (DRISDOL) 50000 units CAPS capsule Take 50,000 Units every 7 (seven) days by mouth.     No current facility-administered medications for this encounter.     Physical Findings: The patient is in no acute distress. Patient is alert and oriented.  height is 5\' 3"  (1.6 m) and weight is 133 lb 6.4 oz (60.5 kg). Her oral temperature is 98.8 F (37.1 C). Her blood pressure is 166/62 (abnormal) and her pulse is 65. Her respiration is 20 and oxygen saturation is 100%. .  No significant changes. Lungs are clear to auscultation bilaterally. Heart has regular rate and rhythm. No palpable cervical, supraclavicular, or axillary adenopathy. Abdomen soft, non-tender, normal bowel sounds. A limited pelvic exam was performed. Patient's pelvic exam would only permit a  small finger digital exam (fifth digit). She has palpable tumor in the distal vagina. Further exam was too uncomfortable for the patient.   Lab Findings: Lab Results  Component Value Date   WBC 2.9 (L) 12/26/2018   HGB 10.7 (L) 12/26/2018   HCT 33.8 (L) 12/26/2018   MCV 93.1 12/26/2018   PLT 201 12/26/2018    Radiographic Findings: Ct Abdomen Pelvis W Contrast  Result Date: 12/27/2018 CLINICAL DATA:  Followup uterine carcinoma. Status post palliative radiation therapy. EXAM: CT ABDOMEN AND PELVIS WITH  CONTRAST TECHNIQUE: Multidetector CT imaging of the abdomen and pelvis was performed using the standard protocol following bolus administration of intravenous contrast. CONTRAST:  75mL OMNIPAQUE IOHEXOL 300 MG/ML  SOLN COMPARISON:  01/21/2018 FINDINGS: Lower Chest: 2 new pulmonary nodules are seen in the right middle and lower lobes, each measuring 9 mm. These are consistent with pulmonary metastases. Hepatobiliary: New hypovascular mass is seen in segment 3 of the left lobe which measures 3.4 x 2.9 cm and is consistent with liver metastasis. No other liver masses are identified. A few tiny sub-cm cysts remain stable. Gallbladder is unremarkable. Pancreas:  No mass or inflammatory changes. Spleen: Within normal limits in size and appearance. Adrenals/Urinary Tract: Normal adrenal glands. Small bilateral renal cysts are again seen, however there is no evidence of renal masses or hydronephrosis. Unremarkable unopacified urinary bladder. Stomach/Bowel: No evidence of obstruction, inflammatory process or abnormal fluid collections. Diverticulosis is seen mainly involving the ascending and sigmoid portions of the colon, as well as the distal ileum, however however there is no evidence of diverticulitis. Mild perirectal and presacral soft tissue stranding is stable and consistent with post radiation changes. Vascular/Lymphatic: No pathologically enlarged lymph nodes. No abdominal aortic aneurysm. Aortic atherosclerosis. Reproductive: Increased low-attenuation is seen distending the endometrial cavity of the uterus since previous study. This could be due to recurrent endometrial carcinoma or benign post treatment hydrometros. Increased soft tissue prominence is seen in the left adnexa measuring approximately 3.1 x 2.3 cm, raising suspicion for local extra-uterine extension of tumor. No evidence of peritoneal thickening or ascites. Other:  None. Musculoskeletal:  No suspicious bone lesions identified. IMPRESSION: 1. New  solitary liver metastasis in left hepatic lobe. 2. New small right middle and lower lobe pulmonary metastases. 3. Increased low-attenuation throughout the endometrial cavity of uterus, which could be due to recurrent endometrial carcinoma or benign post treatment hydrometros. 4. Increased soft tissue prominence in left adnexa, raising suspicion for local extra-uterine tumor. Electronically Signed   By: Earle Gell M.D.   On: 12/27/2018 09:23    Impression:  .  Recurrent endometrial cancer. Patient's images were reviewed: two small pulmonary metastases as well as a solitary liver metastasis. CT does not show her vaginal recurrence but is clinically evident on exam today. We discussed options to consider including hospice referral, but the patient and daughter wish to be aggressive at this point. She would be a candidate for a short course of palliative rad therapy directed at the solitary liver metastasis as well as the lower pelvis region (10 treatments). Patient would not be able to tolerate a small vaginal cylinder treatment, therefore brachytherapy is not possible.  Her abdominal pain is likely coming from her solitary liver metastasis, which on scan in early March measured approximately 3.5 x 3 cm.  We discussed treatments for the patient would be at increased risk at this time given the COVID-19 epidemic, but the patient and daughter are willing to accept this risk. Patient and her  daughter plan on wearing masks in for daily treatments.   Plan:  CT simulation this afternoon, with treatments to begin later this week.   ____________________________________   Blair Promise, PhD, MD    This document serves as a record of services personally performed by Gery Pray, MD. It was created on his behalf by Mary-Margaret Loma Messing, a trained medical scribe. The creation of this record is based on the scribe's personal observations and the provider's statements to them. This document has been checked and  approved by the attending provider.

## 2019-01-16 NOTE — Progress Notes (Signed)
  Radiation Oncology         [phone] ________________________________  Name: NARYIAH SCHLEY MRN: [id number]  Date: 01/16/2019  DOB: 1917-04-15  SIMULATION AND TREATMENT PLANNING NOTE    ICD-10-CM   1. Uterine carcinoma ([address]) C55     DIAGNOSIS:  Recurring endometrial cancer   NARRATIVE:  The patient was brought to the [facility].  Identity was confirmed.  All relevant records and images related to the planned course of therapy were reviewed.  The patient freely provided informed written consent to proceed with treatment after reviewing the details related to the planned course of therapy. The consent form was witnessed and verified by the simulation staff.  Then, the patient was set-up in a stable reproducible  supine position for radiation therapy.  CT images were obtained.  Surface markings were placed.  The CT images were loaded into the planning software.  Then the target and avoidance structures were contoured.  Treatment planning then occurred.  The radiation prescription was entered and confirmed.  Then, I designed and supervised the construction of a total of 5 medically necessary complex treatment devices.  I have requested : Intensity Modulated Radiotherapy (IMRT) is medically necessary for this case for the following reason:  Previous treatment to this area..  I have ordered:dose calc.  PLAN:  The patient will receive 30 Gy in 10 fractions directed at the solitary liver lesion.  The area of recurrence along the left parametrial and vaginal area will receive 20 Gray in 10 fractions given previous treatment to this area.  -----------------------------------  Blair Promise, PhD, MD  This document serves as a record of services personally performed by Gery Pray, MD. It was created on his behalf by Mary-Margaret Loma Messing, a trained medical scribe. The creation of this record is based on the scribe's personal observations and the provider's statements to them. This  document has been checked and approved by the attending provider.

## 2019-01-16 NOTE — Telephone Encounter (Signed)
Contacted pt's daughter. Offered appt today for 2pm. Daughter aware that she will not be able to accompany pt to appt. Daughter verbalized understanding and agreement. Loma Sousa, RN BSN

## 2019-01-16 NOTE — Progress Notes (Signed)
Pt presents today for f/u with Dr. Sondra Come. Pt is accompanied by daughter as pt has dementia. Pt pain in abdomen, usually in morning and pt does not want eat or drink with this pain. Pt with vaginal bleeding per daughter, much like a period type amount. Pt denies dysuria. Pt denies rectal bleeding, diarrhea. Pt reports occasional constipation. Daughter reports pt c/o occasional abdominal bloating.   BP (!) 166/62 (BP Location: Right Arm, Patient Position: Sitting)   Pulse 65   Temp 98.8 F (37.1 C) (Oral)   Resp 20   Ht 5\' 3"  (1.6 m)   Wt 133 lb 6.4 oz (60.5 kg)   SpO2 100%   BMI 23.63 kg/m   Wt Readings from Last 3 Encounters:  01/16/19 133 lb 6.4 oz (60.5 kg)  12/27/18 134 lb 11.2 oz (61.1 kg)  11/01/18 134 lb 8 oz (61 kg)   Loma Sousa, RN BSN

## 2019-01-16 NOTE — Patient Instructions (Signed)
Coronavirus (COVID-19) Are you at risk?  Are you at risk for the Coronavirus (COVID-19)?  To be considered HIGH RISK for Coronavirus (COVID-19), you have to meet the following criteria:  . Traveled to [place], [place], [place], [place] or [place]; or in the [place] to [place], [place], [place], or [place]; and have fever, cough, and shortness of breath within the last 2 weeks of travel OR . Been in close contact with a person diagnosed with COVID-19 within the last 2 weeks and have fever, cough, and shortness of breath . IF YOU DO NOT MEET THESE CRITERIA, YOU ARE CONSIDERED LOW RISK FOR COVID-19.  What to do if you are HIGH RISK for COVID-19?  . If you are having a medical emergency, call 911. . Seek medical care right away. Before you go to a doctor's office, urgent care or emergency department, call ahead and tell them about your recent travel, contact with someone diagnosed with COVID-19, and your symptoms. You should receive instructions from your physician's office regarding next steps of care.  . When you arrive at healthcare provider, tell the healthcare staff immediately you have returned from visiting [place], [place], [place], [place] or [place]; or traveled in the [place] to [place], [place], [place], or [place]; in the last two weeks or you have been in close contact with a person diagnosed with COVID-19 in the last 2 weeks.   . Tell the health care staff about your symptoms: fever, cough and shortness of breath. . After you have been seen by a medical provider, you will be either: o Tested for (COVID-19) and discharged home on quarantine except to seek medical care if symptoms worsen, and asked to  - Stay home and avoid contact with others until you get your results (4-5 days)  - Avoid travel on public transportation if possible (such as bus, train, or airplane) or o Sent to the Emergency Department by EMS for evaluation, COVID-19 testing, and possible  admission depending on your condition and test results.  What to do if you are LOW RISK for COVID-19?  Reduce your risk of any infection by using the same precautions used for avoiding the common cold or flu:  . Wash your hands often with soap and warm water for at least 20 seconds.  If soap and water are not readily available, use an alcohol-based hand sanitizer with at least 60% alcohol.  . If coughing or sneezing, cover your mouth and nose by coughing or sneezing into the elbow areas of your shirt or coat, into a tissue or into your sleeve (not your hands). . Avoid shaking hands with others and consider head nods or verbal greetings only. . Avoid touching your eyes, nose, or mouth with unwashed hands.  . Avoid close contact with people who are sick. . Avoid places or events with large numbers of people in one location, like concerts or sporting events. . Carefully consider travel plans you have or are making. . If you are planning any travel outside or inside the [place], visit the CDC's Travelers' Health webpage for the latest health notices. . If you have some symptoms but not all symptoms, continue to monitor at home and seek medical attention if your symptoms worsen. . If you are having a medical emergency, call 911.   ADDITIONAL HEALTHCARE OPTIONS FOR PATIENTS  [facility] / e-Visit: [email or web address]         [facility]: [phone]  [facility]: [phone]                   [facility]: [phone]   

## 2019-01-19 ENCOUNTER — Other Ambulatory Visit: Payer: Self-pay

## 2019-01-19 ENCOUNTER — Ambulatory Visit
Admission: RE | Admit: 2019-01-19 | Discharge: 2019-01-19 | Disposition: A | Discharge status: Home / Self Care | Payer: Medicare Other | Source: Ambulatory Visit | Attending: Radiation Oncology | Admitting: Radiation Oncology

## 2019-01-19 DIAGNOSIS — C7800 Secondary malignant neoplasm of unspecified lung: Secondary | ICD-10-CM | POA: Diagnosis not present

## 2019-01-19 DIAGNOSIS — C787 Secondary malignant neoplasm of liver and intrahepatic bile duct: Secondary | ICD-10-CM | POA: Diagnosis not present

## 2019-01-19 DIAGNOSIS — C541 Malignant neoplasm of endometrium: Secondary | ICD-10-CM | POA: Insufficient documentation

## 2019-01-19 DIAGNOSIS — Z79899 Other long term (current) drug therapy: Secondary | ICD-10-CM | POA: Insufficient documentation

## 2019-01-19 DIAGNOSIS — C55 Malignant neoplasm of uterus, part unspecified: Secondary | ICD-10-CM | POA: Diagnosis present

## 2019-01-19 DIAGNOSIS — Z79890 Hormone replacement therapy (postmenopausal): Secondary | ICD-10-CM | POA: Insufficient documentation

## 2019-01-19 NOTE — Progress Notes (Signed)
  Radiation Oncology         [phone] ________________________________  Name: Wendy [name] MRN: [id number]  Date: 01/19/2019  DOB: 07/18/1917  Simulation Verification Note    ICD-10-CM   1. Uterine carcinoma ([place]) C55     Status: outpatient  NARRATIVE: The patient was brought to the treatment unit and placed in the planned treatment position. The clinical setup was verified. Then port films were obtained and uploaded to the radiation oncology medical record software.  The treatment beams were carefully compared against the planned radiation fields. The position location and shape of the radiation fields was reviewed. They targeted volume of tissue appears to be appropriately covered by the radiation beams. Organs at risk appear to be excluded as planned.  Based on my personal review, I approved the simulation verification. The patient's treatment will proceed as planned.  -----------------------------------  Blair Promise, PhD, MD

## 2019-01-20 ENCOUNTER — Other Ambulatory Visit: Payer: Self-pay

## 2019-01-20 ENCOUNTER — Ambulatory Visit
Admission: RE | Admit: 2019-01-20 | Discharge: 2019-01-20 | Disposition: A | Discharge status: Home / Self Care | Payer: Medicare Other | Source: Ambulatory Visit | Attending: Radiation Oncology | Admitting: Radiation Oncology

## 2019-01-20 DIAGNOSIS — C541 Malignant neoplasm of endometrium: Secondary | ICD-10-CM | POA: Diagnosis not present

## 2019-01-23 ENCOUNTER — Ambulatory Visit
Admission: RE | Admit: 2019-01-23 | Discharge: 2019-01-23 | Disposition: A | Discharge status: Home / Self Care | Payer: Medicare Other | Source: Ambulatory Visit | Attending: Radiation Oncology | Admitting: Radiation Oncology

## 2019-01-23 ENCOUNTER — Other Ambulatory Visit: Payer: Self-pay

## 2019-01-23 DIAGNOSIS — C541 Malignant neoplasm of endometrium: Secondary | ICD-10-CM | POA: Diagnosis not present

## 2019-01-24 ENCOUNTER — Ambulatory Visit
Admission: RE | Admit: 2019-01-24 | Discharge: 2019-01-24 | Disposition: A | Discharge status: Home / Self Care | Payer: Medicare Other | Source: Ambulatory Visit | Attending: Radiation Oncology | Admitting: Radiation Oncology

## 2019-01-24 ENCOUNTER — Other Ambulatory Visit: Payer: Self-pay

## 2019-01-24 DIAGNOSIS — C541 Malignant neoplasm of endometrium: Secondary | ICD-10-CM | POA: Diagnosis not present

## 2019-01-25 ENCOUNTER — Ambulatory Visit: Payer: Medicare Other

## 2019-01-25 ENCOUNTER — Telehealth: Payer: Self-pay

## 2019-01-25 NOTE — Telephone Encounter (Signed)
1006:  Attempted to reach pt's daughter on home phone. Spoke with pt. Conveyed to pt that this RN would try to reach daughter on cell number. Loma Sousa, RN BSN   1007: left VM on daughter's cell phone re: routine undertreatment visit with Dr. Sondra Come yesterday. Conveyed per Dr. Sondra Come that pt is tolerating radiation well and that he stated she looked the best he's seen her. Also left detailed instructions that pt's treatment time had been moved to 2pm so that pt would not be treated at the end of day. This RN's direct number left on VM for any further questions/concerns. Loma Sousa, RN BSN

## 2019-01-26 ENCOUNTER — Other Ambulatory Visit: Payer: Self-pay

## 2019-01-26 ENCOUNTER — Other Ambulatory Visit: Payer: Self-pay | Admitting: Hematology

## 2019-01-26 ENCOUNTER — Ambulatory Visit
Admission: RE | Admit: 2019-01-26 | Discharge: 2019-01-26 | Disposition: A | Discharge status: Home / Self Care | Payer: Medicare Other | Source: Ambulatory Visit | Attending: Radiation Oncology | Admitting: Radiation Oncology

## 2019-01-26 DIAGNOSIS — C541 Malignant neoplasm of endometrium: Secondary | ICD-10-CM | POA: Diagnosis not present

## 2019-01-27 ENCOUNTER — Ambulatory Visit
Admission: RE | Admit: 2019-01-27 | Discharge: 2019-01-27 | Disposition: A | Discharge status: Home / Self Care | Payer: Medicare Other | Source: Ambulatory Visit | Attending: Radiation Oncology | Admitting: Radiation Oncology

## 2019-01-27 ENCOUNTER — Other Ambulatory Visit: Payer: Self-pay

## 2019-01-27 DIAGNOSIS — C541 Malignant neoplasm of endometrium: Secondary | ICD-10-CM | POA: Diagnosis not present

## 2019-01-30 ENCOUNTER — Ambulatory Visit
Admission: RE | Admit: 2019-01-30 | Discharge: 2019-01-30 | Disposition: A | Discharge status: Home / Self Care | Payer: Medicare Other | Source: Ambulatory Visit | Attending: Radiation Oncology | Admitting: Radiation Oncology

## 2019-01-30 ENCOUNTER — Other Ambulatory Visit: Payer: Self-pay

## 2019-01-30 DIAGNOSIS — C541 Malignant neoplasm of endometrium: Secondary | ICD-10-CM | POA: Diagnosis not present

## 2019-01-31 ENCOUNTER — Ambulatory Visit
Admission: RE | Admit: 2019-01-31 | Discharge: 2019-01-31 | Disposition: A | Discharge status: Home / Self Care | Payer: Medicare Other | Source: Ambulatory Visit | Attending: Radiation Oncology | Admitting: Radiation Oncology

## 2019-01-31 ENCOUNTER — Other Ambulatory Visit: Payer: Self-pay

## 2019-01-31 DIAGNOSIS — C541 Malignant neoplasm of endometrium: Secondary | ICD-10-CM | POA: Diagnosis not present

## 2019-02-01 ENCOUNTER — Ambulatory Visit
Admission: RE | Admit: 2019-02-01 | Discharge: 2019-02-01 | Disposition: A | Discharge status: Home / Self Care | Payer: Medicare Other | Source: Ambulatory Visit | Attending: Radiation Oncology | Admitting: Radiation Oncology

## 2019-02-01 ENCOUNTER — Ambulatory Visit: Payer: Medicare Other

## 2019-02-01 ENCOUNTER — Other Ambulatory Visit: Payer: Self-pay

## 2019-02-01 DIAGNOSIS — C541 Malignant neoplasm of endometrium: Secondary | ICD-10-CM | POA: Diagnosis not present

## 2019-02-02 ENCOUNTER — Ambulatory Visit: Payer: Medicare Other

## 2019-02-03 ENCOUNTER — Ambulatory Visit
Admission: RE | Admit: 2019-02-03 | Discharge: 2019-02-03 | Disposition: A | Discharge status: Home / Self Care | Payer: Medicare Other | Source: Ambulatory Visit | Attending: Radiation Oncology | Admitting: Radiation Oncology

## 2019-02-03 ENCOUNTER — Other Ambulatory Visit: Payer: Self-pay

## 2019-02-03 DIAGNOSIS — C541 Malignant neoplasm of endometrium: Secondary | ICD-10-CM | POA: Diagnosis not present

## 2019-02-06 ENCOUNTER — Encounter: Payer: Self-pay | Admitting: Radiation Oncology

## 2019-02-06 NOTE — Progress Notes (Signed)
HEMATOLOGY ONCOLOGY CLINIC NOTE  Date of Service: 02/07/19   Patient Care Team: Lajean Manes, MD as PCP - General (Internal Medicine)  CHIEF COMPLAINTS/PURPOSE OF CONSULTATION:  Anemia F/u for recently diagnosed uterine papillary carcinoma.  HISTORY OF PRESENTING ILLNESS:   Wendy [name] 85 y.o. female is here because of worsening anemia.  Patient has a history of hypertension and hypothyroidism who as per her daughter has been a very active person until recently. Her daughter notes that patient's levothyroxine dose was reduced from 75 mcg to 50 mcg daily and this produced a significant change in the patient's cognition and energy levels.  She reports that it is very unusual for the patient to wake up late and then sleep throughout the day as she is currently doing. Per the patient's daughter she was a very active person, who would do a lot of walking, including walking around the track.   She notes that the patient has been slowly declining over the past year ever since seeing a new provider who started decreasing her thyroid medication. The patient's appetite has been declining as well.    She was seen in the ED for two syncopal episodes. On 09/04/2018 .  She was noted to be hypotensive and hypoxic.  Symptoms were thought to be related to dehydration due to poor oral intake.  Echo showed normal ejection fraction of 60-65% with grade 2 diastolic dysfunction.  Her losartan dose was significantly reduced on discharge.  She was noted to have several episodes of nonsustained VT but her heart rate was in the 50s.  Hemoglobin was noted to be between 7 and 7.5 but transfusion was withheld due to presence of positive Coombs test and outpatient hematology consult was recommended. FOBT was never sent.  She was found to have abnormal CBC from 09/07/2017 -with hemoglobin of 7 MCV of 95, WBC count of 3k with an ANC of 2.3k.  And a platelet count of 175k.  Reticulocyte percentage of 1.9,  absolute reticulocyte count of 45.8k/ul .  She was noted to have a positive Coombs test but her LDH and haptoglobin levels were within normal limits suggesting against overt hemolysis.  ANA negative. Sedimentation rate 138.  CRP within normal limits. Free T4 0.87 Ferritin 857 with 23% iron saturation. Folate 19.5 B12 - 470  She denies recent chest pain on exertion, shortness of breath on minimal exertion, or palpitations. She denies cough. She had not noticed any recent bleeding such as epistaxis, hematuria or hematochezia.   The patient denies over the counter NSAID ingestion. She is not on antiplatelets agents.   She had no prior history or diagnosis of cancer. Her age appropriate screening programs are up-to-date.  She denies any pica and eats a variety of diet.  She never donated blood or received blood transfusion.   Some days her appetite is better than others. The patients daughter states she has also become more forgetful at times.  Pt denies fever, chills, trouble swelling, abdominal pain, or any other symptoms or complaints at this time.  Patient really knows that she still feels somewhat fatigued but has no overt lightheadedness or dizziness at this time and feels better than when she was admitted. No overt infectious issues. No overt evidence of bleeding.  Some unquantified weight loss due to decreased appetite. No other overt focal symptoms.  Interval History:   Wendy [name] is here regarding her borderline macrocytic anemia and leucopenia and her recently diagnosed uterine high grade papillary caricnoma. The patient's  last visit with [place] was on 12/27/18. The pt is accompanied today by her daughter via cell phone. The pt reports that she is doing well overall.  In the interim, the pt has received palliative IMRT to the liver metastasis and extra-uterine extension with my colleague Dr. Gery Pray in [place]. She began palliative IMRT on 01/19/19 and completed treatment on  02/03/19.  The pt notes that she has felt a little more tired since completing RT. The pt's daughter notes that the pt has been "eating pretty well," and has dropped 4 pounds since our last visit. The pt notes that her appetite has been stable and that she "gets full quick." She denies abdominal pains, and denies diarrhea or problems passing urine. She denies SOB, CP, abdominal pains. The pt notes that she hasn't been walking very much.  The pt reports that she is not having any vaginal bleeding at this time.  The pt's daughter notes that the pt had a blood clot behind her right knee a few years ago after a long distance trip. The pt is not taking aspirin any longer.  Lab results today (02/07/19) of CBC w/diff and CMP is as follows: all values are WNL except for WBC at 2.0k, RBC at 3.53, HGB at 10.4, HCT at 32.9, RDW at 16.6, ANC at 1.4k, Lymphs abs at 200, Creatinine at 1.08, GFR at 49. 02/07/19 Ferritin is pending 02/07/19 Iron & TIBC is pending  On review of systems, pt reports stable energy levels, stable appetite, some weight loss, and denies SOB, CP, abdominal pains, vaginal bleeding, leg swelling, CP, SOB, calf pain, and any other symptoms.   MEDICAL HISTORY:  Past Medical History:  Diagnosis Date   History of radiation therapy 02/21/2018-03/31/2018   Uterus, pelvis1.8 Gy in 25 fractions for a total dose of 45 Gy      Hypertension    Thyroid disease   Nonsustained VT Hypothyroidism Acute kidney injury 08/2017 Depression-on Celexa.  SURGICAL HISTORY: Past Surgical History:  Procedure Laterality Date   CORONARY ANGIOPLASTY      SOCIAL HISTORY: Social History   Socioeconomic History   Marital status: Widowed    Spouse name: Not on file   Number of children: Not on file   Years of education: Not on file   Highest education level: Not on file  Occupational History   Not on file  Social Needs   Financial resource strain: Not on file   Food insecurity:    Worry:  Not on file    Inability: Not on file   Transportation needs:    Medical: Not on file    Non-medical: Not on file  Tobacco Use   Smoking status: Never Smoker   Smokeless tobacco: Never Used  Substance and Sexual Activity   Alcohol use: No   Drug use: No   Sexual activity: Never  Lifestyle   Physical activity:    Days per week: Not on file    Minutes per session: Not on file   Stress: Not on file  Relationships   Social connections:    Talks on phone: Not on file    Gets together: Not on file    Attends religious service: Not on file    Active member of club or organization: Not on file    Attends meetings of clubs or organizations: Not on file    Relationship status: Not on file   Intimate partner violence:    Fear of current or ex partner: Not  on file    Emotionally abused: Not on file    Physically abused: Not on file    Forced sexual activity: Not on file  Other Topics Concern   Not on file  Social History Narrative   Not on file    FAMILY HISTORY: No family history on file.  ALLERGIES:  has No Known Allergies.  MEDICATIONS:  Current Outpatient Medications  Medication Sig Dispense Refill   citalopram (CELEXA) 10 MG tablet Take 5 mg daily after supper by mouth.      ENSURE (ENSURE) Take 237 mLs by mouth 2 (two) times daily between meals.     hydrocortisone cream 1 % Apply to affected area 2 times daily 15 g 0   losartan (COZAAR) 50 MG tablet Take 50 mg by mouth daily.     SYNTHROID 75 MCG tablet TAKE 1 TABLET(75 MCG) BY MOUTH DAILY BEFORE BREAKFAST 30 tablet 3   triamcinolone (KENALOG) 0.025 % cream Apply 1 application 2 (two) times daily as needed topically (rash).     Vitamin D, Ergocalciferol, (DRISDOL) 50000 units CAPS capsule Take 50,000 Units every 7 (seven) days by mouth.     No current facility-administered medications for this visit.     REVIEW OF SYSTEMS:    A 10+ POINT REVIEW OF SYSTEMS WAS OBTAINED including neurology,  dermatology, psychiatry, cardiac, respiratory, lymph, extremities, GI, GU, Musculoskeletal, constitutional, breasts, reproductive, HEENT.  All pertinent positives are noted in the HPI.  All others are negative.   PHYSICAL EXAMINATION: ECOG PERFORMANCE STATUS: 2 - Symptomatic, <50% confined to bed  Vitals:   02/07/19 1455  BP: (!) 146/74  Pulse: 74  Resp: 17  Temp: 98.4 F (36.9 C)  SpO2: 100%   Filed Weights   02/07/19 1455  Weight: 130 lb 1.6 oz (59 kg)     GENERAL:alert, in no acute distress and comfortable SKIN: no acute rashes, no significant lesions EYES: conjunctiva are pink and non-injected, sclera anicteric OROPHARYNX: MMM, no exudates, no oropharyngeal erythema or ulceration NECK: supple, no JVD LYMPH:  no palpable lymphadenopathy in the cervical, axillary or inguinal regions LUNGS: clear to auscultation b/l with normal respiratory effort HEART: regular rate & rhythm ABDOMEN:  normoactive bowel sounds , non tender, not distended. No palpable hepatosplenomegaly.  Extremity: no pedal edema PSYCH: alert & oriented x 3 with fluent speech NEURO: no focal motor/sensory deficits   LABORATORY DATA:  I have reviewed the data as listed  . CBC Latest Ref Rng & Units 02/07/2019 12/26/2018 11/01/2018  WBC 4.0 - 10.5 K/uL 2.0(L) 2.9(L) 2.8(L)  Hemoglobin 12.0 - 15.0 g/dL 10.4(L) 10.7(L) 10.2(L)  Hematocrit 36.0 - 46.0 % 32.9(L) 33.8(L) 32.0(L)  Platelets 150 - 400 K/uL 168 201 190  ANC 1.3k . CMP Latest Ref Rng & Units 02/07/2019 12/26/2018 11/01/2018  Glucose 70 - 99 mg/dL 90 89 87  BUN 8 - 23 mg/dL 13 13 15   Creatinine 0.44 - 1.00 mg/dL 1.08(H) 1.07(H) 1.06(H)  Sodium 135 - 145 mmol/L 139 141 142  Potassium 3.5 - 5.1 mmol/L 4.1 4.5 4.7  Chloride 98 - 111 mmol/L 104 103 106  CO2 22 - 32 mmol/L 26 27 28   Calcium 8.9 - 10.3 mg/dL 9.2 9.5 9.2  Total Protein 6.5 - 8.1 g/dL 8.1 8.3(H) 7.9  Total Bilirubin 0.3 - 1.2 mg/dL 0.4 0.4 0.4  Alkaline Phos 38 - 126 U/L 63 60 59  AST 15 -  41 U/L 21 20 20   ALT 0 - 44 U/L 8 7 9  RADIOGRAPHIC STUDIES: I have personally reviewed the radiological images as listed and agreed with the findings in the report. No results found.  ASSESSMENT & PLAN:   85 y.o. female   1) Normocytic Anemia likely multifactorial  hgb improved from 8.1 to 9.5 Likely MDS + blood loss from uterine high grade papillary uterine carcinoma with vaginal bleeding + Anemia of chronic disease related to newly diagnosed malignancy.  Slightly elevated LDH level at 297 but normal haptoglobin suggesting against acute hemolysis.   Homocystine is elevated though B12 level is within normal limits and RBC folate- WNL  10/28/17 MM panel showed no M-Protein, with elevated IgG and IgM. Kappa and lambda light chain were both elevated- likely reactive to newly diagnosed malignancy. No overt evidence of GI bleeding.  No evidence of iron deficiency. Vaginal bleeding has resolved for now Cannot rule out MDS given the patient's advanced age.  2) leukopenia 2.2k with mild neutropenia 1.3k  3)hypothyroidism  -on levothyroxine replacement on 23mcg po daily.  4) Elevated LDH levels -no overt evidence of hemolysis given normal haptoglobin level and no reticulocytosis and normal bilirubin levels.  Likely from uterine papillary carcinoma 5) Positive direct Coombs test - no evidence of overt hemolysis at this time.  6) Recently diagnosed high grade papillary uterine carcinoma . S/p completion of palliative RT. Deemed not a good candidate for chemotherapy. Currently with resolution of vaginal bleeding. Initial CT with no evidence of metastatic disease 12/26/18 CT A/P revealed New solitary liver metastasis in left hepatic lobe. 2. New small right middle and lower lobe pulmonary metastases. 3. Increased low-attenuation throughout the endometrial cavity of uterus, which could be due to recurrent endometrial carcinoma or benign post treatment hydrometros. 4. Increased soft tissue  prominence in left adnexa, raising suspicion for local extra-uterine tumor.  PLAN  -Discussed pt labwork today, 02/07/19; WBC lower at 2.0k, HGB stable at 10.4, chemistries are stable. -02/07/19 Ferritin and CA125 are pending. -Do not recommend chemotherapy given pt's age and bone marrow reserves. -Discussed that we could try progesterone-like medications- either Arimidex, or Megace, for tumor suppression and appetite stimulation. - Discussed adding 81mg  aspirin to reduce risk of blood clots. Pt would like to begin Megace starting in low at 40mg  po daily and 81mg  aspirin. -Provided supplemental information -Recommended that the pt continue to eat well -Discussed that I do not recommend palliative chemotherapy given the patient's age and ability to tolerate chemotherapy -Pt has finished palliative radiation with resolution of vaginal bleeding. -Continue thyroid replacement would not decrease dose even if TSH WNL since patient had cognitive issues with previous attempt at thyroid dose reduction. -Will see the pt back in 1 month  5) . Patient Active Problem List   Diagnosis Date Noted   Uterine carcinoma ([place]) 01/31/2018   Syncope 09/28/2013   Altered mental status 09/26/2013   Syncope and collapse 09/26/2013   Hypothyroidism 09/26/2013   Depression 09/26/2013   HTN (hypertension) 09/26/2013  Plan -continue f/u with PCP   RTC with Dr Irene Limbo with labs In 4 weeks   The total time spent in the appt was 30 minutes and more than 50% was on counseling and direct patient cares.   Sullivan Lone MD [place] [facility] Hematology/Oncology Physician [facility]  (Office):       [phone] (Work cell):  [phone] (Fax):           [phone]   I, Baldwin [place], am acting as a scribe for Dr. Sullivan Lone.   Marland KitchenI  have reviewed the above documentation for accuracy and completeness, and I agree with the above. Brunetta Genera MD

## 2019-02-06 NOTE — Progress Notes (Signed)
  Radiation Oncology         [phone] ________________________________  Name: Wendy [name] MRN: [id number]  Date: 02/06/2019  DOB: 1917-05-28  End of Treatment Note  Diagnosis:   Recurrent endometrial cancer     Indication for treatment:  Palliative       Radiation treatment dates:   01/19/2019 - 02/03/2019  Site/dose:    1. Liver / 30 Gy in 10 fractions 2. Vagina (re-tx) / 20 Gy in 10 fractions  Beams/energy:    1. IMRT, photons / 6X 2. IMRT, photons / 6X  Narrative: The patient tolerated radiation treatment extremely well. She reported some fatigue and denied any other symptoms. She noted improved pelvic/abdominal cramping as treatments went on.  Plan: The patient has completed radiation treatment. The patient will return to radiation oncology clinic for routine followup in one month. I advised them to call or return sooner if they have any questions or concerns related to their recovery or treatment.  -----------------------------------  Blair Promise, PhD, MD  This document serves as a record of services personally performed by Gery Pray, MD. It was created on his behalf by Wilburn Mylar, a trained medical scribe. The creation of this record is based on the scribe's personal observations and the provider's statements to them. This document has been checked and approved by the attending provider.

## 2019-02-07 ENCOUNTER — Other Ambulatory Visit: Payer: Self-pay

## 2019-02-07 ENCOUNTER — Other Ambulatory Visit: Payer: Self-pay | Admitting: *Deleted

## 2019-02-07 ENCOUNTER — Inpatient Hospital Stay: Payer: Medicare Other | Attending: Hematology

## 2019-02-07 ENCOUNTER — Inpatient Hospital Stay (HOSPITAL_BASED_OUTPATIENT_CLINIC_OR_DEPARTMENT_OTHER): Payer: Medicare Other | Admitting: Hematology

## 2019-02-07 VITALS — BP 146/74 | HR 74 | Temp 98.4°F | Resp 17 | Ht 63.0 in | Wt 130.1 lb

## 2019-02-07 DIAGNOSIS — F419 Anxiety disorder, unspecified: Secondary | ICD-10-CM | POA: Insufficient documentation

## 2019-02-07 DIAGNOSIS — Z7982 Long term (current) use of aspirin: Secondary | ICD-10-CM | POA: Insufficient documentation

## 2019-02-07 DIAGNOSIS — E039 Hypothyroidism, unspecified: Secondary | ICD-10-CM | POA: Insufficient documentation

## 2019-02-07 DIAGNOSIS — I10 Essential (primary) hypertension: Secondary | ICD-10-CM | POA: Insufficient documentation

## 2019-02-07 DIAGNOSIS — D72819 Decreased white blood cell count, unspecified: Secondary | ICD-10-CM | POA: Diagnosis not present

## 2019-02-07 DIAGNOSIS — Z79899 Other long term (current) drug therapy: Secondary | ICD-10-CM | POA: Insufficient documentation

## 2019-02-07 DIAGNOSIS — D649 Anemia, unspecified: Secondary | ICD-10-CM | POA: Diagnosis not present

## 2019-02-07 DIAGNOSIS — C55 Malignant neoplasm of uterus, part unspecified: Secondary | ICD-10-CM

## 2019-02-07 DIAGNOSIS — Z923 Personal history of irradiation: Secondary | ICD-10-CM | POA: Diagnosis not present

## 2019-02-07 LAB — CBC WITH DIFFERENTIAL (CANCER CENTER ONLY)
Abs Immature Granulocytes: 0.02 10*3/uL (ref 0.00–0.07)
Basophils Absolute: 0 10*3/uL (ref 0.0–0.1)
Basophils Relative: 1 %
Eosinophils Absolute: 0 10*3/uL (ref 0.0–0.5)
Eosinophils Relative: 2 %
HCT: 32.9 % — ABNORMAL LOW (ref 36.0–46.0)
Hemoglobin: 10.4 g/dL — ABNORMAL LOW (ref 12.0–15.0)
Immature Granulocytes: 1 %
Lymphocytes Relative: 9 %
Lymphs Abs: 0.2 10*3/uL — ABNORMAL LOW (ref 0.7–4.0)
MCH: 29.5 pg (ref 26.0–34.0)
MCHC: 31.6 g/dL (ref 30.0–36.0)
MCV: 93.2 fL (ref 80.0–100.0)
Monocytes Absolute: 0.4 10*3/uL (ref 0.1–1.0)
Monocytes Relative: 19 %
Neutro Abs: 1.4 10*3/uL — ABNORMAL LOW (ref 1.7–7.7)
Neutrophils Relative %: 68 %
Platelet Count: 168 10*3/uL (ref 150–400)
RBC: 3.53 MIL/uL — ABNORMAL LOW (ref 3.87–5.11)
RDW: 16.6 % — ABNORMAL HIGH (ref 11.5–15.5)
WBC Count: 2 10*3/uL — ABNORMAL LOW (ref 4.0–10.5)
nRBC: 0 % (ref 0.0–0.2)

## 2019-02-07 LAB — CMP (CANCER CENTER ONLY)
ALT: 8 U/L (ref 0–44)
AST: 21 U/L (ref 15–41)
Albumin: 3.8 g/dL (ref 3.5–5.0)
Alkaline Phosphatase: 63 U/L (ref 38–126)
Anion gap: 9 (ref 5–15)
BUN: 13 mg/dL (ref 8–23)
CO2: 26 mmol/L (ref 22–32)
Calcium: 9.2 mg/dL (ref 8.9–10.3)
Chloride: 104 mmol/L (ref 98–111)
Creatinine: 1.08 mg/dL — ABNORMAL HIGH (ref 0.44–1.00)
GFR, Est AFR Am: 49 mL/min — ABNORMAL LOW (ref >60)
GFR, Estimated: 42 mL/min — ABNORMAL LOW (ref >60)
Glucose, Bld: 90 mg/dL (ref 70–99)
Potassium: 4.1 mmol/L (ref 3.5–5.1)
Sodium: 139 mmol/L (ref 135–145)
Total Bilirubin: 0.4 mg/dL (ref 0.3–1.2)
Total Protein: 8.1 g/dL (ref 6.5–8.1)

## 2019-02-07 LAB — IRON AND TIBC
Iron: 47 ug/dL (ref 41–142)
Saturation Ratios: 20 % — ABNORMAL LOW (ref 21–57)
TIBC: 240 ug/dL (ref 236–444)
UIBC: 193 ug/dL (ref 120–384)

## 2019-02-07 LAB — FERRITIN: Ferritin: 786 ng/mL — ABNORMAL HIGH (ref 11–307)

## 2019-02-07 MED ORDER — ASPIRIN EC 81 MG PO TBEC: 81.0000 mg | DELAYED_RELEASE_TABLET | Freq: Every day | ORAL | Status: DC

## 2019-02-07 MED ORDER — MEGESTROL ACETATE 40 MG PO TABS: 40.0000 mg | ORAL_TABLET | Freq: Every day | ORAL | 1 refills | Status: DC

## 2019-02-08 ENCOUNTER — Telehealth: Payer: Self-pay | Admitting: Hematology

## 2019-02-08 LAB — CA 125: Cancer Antigen (CA) 125: 40.1 U/mL — ABNORMAL HIGH (ref 0.0–38.1)

## 2019-02-08 NOTE — Telephone Encounter (Signed)
Scheduled appt per 4/21 los. °

## 2019-03-07 ENCOUNTER — Telehealth: Payer: Self-pay | Admitting: *Deleted

## 2019-03-07 NOTE — Telephone Encounter (Signed)
Patient's daughter Ms Elon Jester informed [place] screener this AM that mother has not been taking Megace as ordered last appt, she is "thinking about it" and wondered if they should come to appt tomorrow. [name] advises patient to begin medicine if she chooses and see him with labs in a month. Daughter says she will encourage her mother and call office if mother chooses not to take it or for other concerns. Schedule message sent to contact daughter to make appt for 1 month.

## 2019-03-08 ENCOUNTER — Inpatient Hospital Stay: Payer: Medicare Other

## 2019-03-08 ENCOUNTER — Telehealth: Payer: Self-pay | Admitting: Hematology

## 2019-03-08 ENCOUNTER — Inpatient Hospital Stay: Payer: Medicare Other | Admitting: Hematology

## 2019-03-08 NOTE — Telephone Encounter (Signed)
Scheduled appt per 5/19 sch message - unable to reach daughter left message with appt date and time

## 2019-03-09 ENCOUNTER — Ambulatory Visit: Admission: RE | Admit: 2019-03-09 | Payer: Medicare Other | Source: Ambulatory Visit | Admitting: Radiation Oncology

## 2019-03-20 ENCOUNTER — Ambulatory Visit
Admission: RE | Admit: 2019-03-20 | Discharge: 2019-03-20 | Disposition: A | Discharge status: Home / Self Care | Payer: Medicare Other | Source: Ambulatory Visit | Attending: Radiation Oncology | Admitting: Radiation Oncology

## 2019-03-20 ENCOUNTER — Encounter: Payer: Self-pay | Admitting: Radiation Oncology

## 2019-03-20 ENCOUNTER — Other Ambulatory Visit: Payer: Self-pay

## 2019-03-20 VITALS — BP 167/64 | HR 60 | Temp 98.3°F | Resp 18 | Ht 63.0 in

## 2019-03-20 DIAGNOSIS — C55 Malignant neoplasm of uterus, part unspecified: Secondary | ICD-10-CM | POA: Insufficient documentation

## 2019-03-20 DIAGNOSIS — C787 Secondary malignant neoplasm of liver and intrahepatic bile duct: Secondary | ICD-10-CM | POA: Diagnosis not present

## 2019-03-20 DIAGNOSIS — Z7982 Long term (current) use of aspirin: Secondary | ICD-10-CM | POA: Diagnosis not present

## 2019-03-20 DIAGNOSIS — Z923 Personal history of irradiation: Secondary | ICD-10-CM | POA: Diagnosis not present

## 2019-03-20 DIAGNOSIS — Z79899 Other long term (current) drug therapy: Secondary | ICD-10-CM | POA: Insufficient documentation

## 2019-03-20 NOTE — Patient Instructions (Signed)
Coronavirus (COVID-19) Are you at risk?  Are you at risk for the Coronavirus (COVID-19)?  To be considered HIGH RISK for Coronavirus (COVID-19), you have to meet the following criteria:  . Traveled to [place], [place], [place], [place] or [place]; or in the [place] to [place], [place], [place], or [place]; and have fever, cough, and shortness of breath within the last 2 weeks of travel OR . Been in close contact with a person diagnosed with COVID-19 within the last 2 weeks and have fever, cough, and shortness of breath . IF YOU DO NOT MEET THESE CRITERIA, YOU ARE CONSIDERED LOW RISK FOR COVID-19.  What to do if you are HIGH RISK for COVID-19?  . If you are having a medical emergency, call 911. . Seek medical care right away. Before you go to a doctor's office, urgent care or emergency department, call ahead and tell them about your recent travel, contact with someone diagnosed with COVID-19, and your symptoms. You should receive instructions from your physician's office regarding next steps of care.  . When you arrive at healthcare provider, tell the healthcare staff immediately you have returned from visiting [place], [place], [place], [place] or [place]; or traveled in the [place] to [place], [place], [place], or [place]; in the last two weeks or you have been in close contact with a person diagnosed with COVID-19 in the last 2 weeks.   . Tell the health care staff about your symptoms: fever, cough and shortness of breath. . After you have been seen by a medical provider, you will be either: o Tested for (COVID-19) and discharged home on quarantine except to seek medical care if symptoms worsen, and asked to  - Stay home and avoid contact with others until you get your results (4-5 days)  - Avoid travel on public transportation if possible (such as bus, train, or airplane) or o Sent to the Emergency Department by EMS for evaluation, COVID-19 testing, and possible  admission depending on your condition and test results.  What to do if you are LOW RISK for COVID-19?  Reduce your risk of any infection by using the same precautions used for avoiding the common cold or flu:  . Wash your hands often with soap and warm water for at least 20 seconds.  If soap and water are not readily available, use an alcohol-based hand sanitizer with at least 60% alcohol.  . If coughing or sneezing, cover your mouth and nose by coughing or sneezing into the elbow areas of your shirt or coat, into a tissue or into your sleeve (not your hands). . Avoid shaking hands with others and consider head nods or verbal greetings only. . Avoid touching your eyes, nose, or mouth with unwashed hands.  . Avoid close contact with people who are sick. . Avoid places or events with large numbers of people in one location, like concerts or sporting events. . Carefully consider travel plans you have or are making. . If you are planning any travel outside or inside the [place], visit the CDC's Travelers' Health webpage for the latest health notices. . If you have some symptoms but not all symptoms, continue to monitor at home and seek medical attention if your symptoms worsen. . If you are having a medical emergency, call 911.   ADDITIONAL HEALTHCARE OPTIONS FOR PATIENTS  [facility] / e-Visit: [email or web address]         [facility]: [phone]  [place]   [facility]: [phone]                   [facility]: [phone]   

## 2019-03-20 NOTE — Progress Notes (Signed)
Radiation Oncology         [phone] ________________________________  Name: Wendy [name] MRN: [id number]  Date: 03/20/2019  DOB: 01-24-1917  Follow-Up Visit Note  CC: Lajean Manes, MD  [name], MD    ICD-10-CM   1. Uterine carcinoma (H[facility]) C55     Diagnosis:   Recurrent endometrial cancer      Interval Since Last Radiation:  1 months, 2 weeks  Radiation treatment dates:   01/19/2019 - 02/03/2019  Site/dose:    1. Liver / 30 Gy in 10 fractions 2. Vagina (re-tx) / 20 Gy in 10 fractions  Narrative:  The patient returns today for routine follow-up.  she is doing well overall.  Per discussion over the phone with daughter the patient has had some fatigue she is able to walk up and down a flight of stairs.  Patient denies any abdominal bloating or pelvic pain..  She denies any vaginal bleeding.   Megace has helped her appetite.                       ALLERGIES:  has No Known Allergies.  Meds: Current Outpatient Medications  Medication Sig Dispense Refill  . aspirin EC 81 MG tablet Take 1 tablet (81 mg total) by mouth daily.    . citalopram (CELEXA) 10 MG tablet Take 5 mg daily after supper by mouth.     . ENSURE (ENSURE) Take 237 mLs by mouth 2 (two) times daily between meals.    . hydrocortisone cream 1 % Apply to affected area 2 times daily 15 g 0  . losartan (COZAAR) 50 MG tablet Take 50 mg by mouth daily.    . megestrol (MEGACE) 40 MG tablet Take 1 tablet (40 mg total) by mouth daily. 30 tablet 1  . SYNTHROID 75 MCG tablet TAKE 1 TABLET(75 MCG) BY MOUTH DAILY BEFORE BREAKFAST 30 tablet 3  . triamcinolone (KENALOG) 0.025 % cream Apply 1 application 2 (two) times daily as needed topically (rash).    . Vitamin D, Ergocalciferol, (DRISDOL) 50000 units CAPS capsule Take 50,000 Units every 7 (seven) days by mouth.     No current facility-administered medications for this encounter.     Physical Findings: The patient is in no acute distress. Patient is alert and  oriented.  height is 5\' 3"  (1.6 m). Her temporal temperature is 98.3 F (36.8 C). Her blood pressure is 167/64 (abnormal) and her pulse is 60. Her respiration is 18 and oxygen saturation is 100%. .  Lungs are clear to auscultation bilaterally. Heart has regular rate and rhythm. No palpable cervical, supraclavicular, or axillary adenopathy. Abdomen soft, non-tender, normal bowel sounds.  The patient did not wish to pursue a pelvic exam today Lab Findings: Lab Results  Component Value Date   WBC 2.0 (L) 02/07/2019   HGB 10.4 (L) 02/07/2019   HCT 32.9 (L) 02/07/2019   MCV 93.2 02/07/2019   PLT 168 02/07/2019    Radiographic Findings: No results found.  Impression:  The patient is recovering from the effects of radiation.  Clinically stable at this time.  Recent course of palliative radiation therapy for solitary metastasis in the liver and recurrence in the vaginal region.  Plan: Routine follow-up in 3 months.  She continues to follow-up closely in medical oncology with [name].  ____________________________________   Blair Promise, PhD, MD    This document serves as a record of services personally performed by Gery Pray, MD. It was created  on his behalf by Wyn Quaker, a trained medical scribe. The creation of this record is based on the scribe's personal observations and the provider's statements to them. This document has been checked and approved by the attending provider.

## 2019-03-20 NOTE — Progress Notes (Signed)
Pt presents today for f/u with Dr. Sondra Come. Pt's daughter is on speaker phone. Daughter reports that megace is causing excessive saliva and pt needs to expel. Pt denies N/V. Pt denies vaginal bleeding. Pt states she feels "fine" but daughter disagrees and states pt sleeps a lot and is having trouble with getting out chair, climbing stairs, and walking on flat floor.    BP (!) 167/64 (BP Location: Left Arm, Patient Position: Sitting)   Pulse 60   Temp 98.3 F (36.8 C) (Temporal)   Resp 18   Ht 5\' 3"  (1.6 m)   SpO2 100%   BMI 23.05 kg/m   Wt Readings from Last 3 Encounters:  02/07/19 130 lb 1.6 oz (59 kg)  01/16/19 133 lb 6.4 oz (60.5 kg)  12/27/18 134 lb 11.2 oz (61.1 kg)   Loma Sousa, RN BSN

## 2019-03-21 ENCOUNTER — Telehealth: Payer: Self-pay | Admitting: *Deleted

## 2019-03-21 NOTE — Telephone Encounter (Signed)
CALLED PATIENT'S DAUGHTER- DEBORAH TO INFORM OF FU APPT. FOR 07-03-19 @ 3:30 PM WITH DR. KINARD, SPOKE WITH PATIENT'S DAUGHTER AND SHE IS AWARE OF THIS APPT.

## 2019-04-10 ENCOUNTER — Inpatient Hospital Stay: Payer: Medicare Other | Admitting: Hematology

## 2019-04-10 ENCOUNTER — Telehealth: Payer: Self-pay | Admitting: *Deleted

## 2019-04-10 ENCOUNTER — Inpatient Hospital Stay: Payer: Medicare Other

## 2019-04-10 NOTE — Telephone Encounter (Signed)
Daughter cannot get mother ready to come today. Mother does not seem to understand that she needs to go to the doctor, and won't get dressed today. Have cancelled today's appt and sent scheduling message to reschedule patient for Tuesday 6/23, same times. Daughter states she will try to get her mother here tomorrow.  Daughter is concerned, mother has increased vaginal bleeding, soaked through her pajamas yesterday. She wants her mother to come and have her blood tested.

## 2019-04-11 ENCOUNTER — Inpatient Hospital Stay: Payer: Medicare Other

## 2019-04-11 ENCOUNTER — Inpatient Hospital Stay: Payer: Medicare Other | Admitting: Hematology

## 2019-04-17 ENCOUNTER — Ambulatory Visit: Payer: Medicare Other | Admitting: Podiatry

## 2019-04-20 NOTE — Progress Notes (Signed)
HEMATOLOGY ONCOLOGY CLINIC NOTE  Date of Service: 04/24/19   Patient Care Team: Lajean Manes, MD as PCP - General (Internal Medicine)  CHIEF COMPLAINTS/PURPOSE OF CONSULTATION:  Anemia F/u for recently diagnosed uterine papillary carcinoma.  HISTORY OF PRESENTING ILLNESS:   Wendy [name] 85 y.o. female is here because of worsening anemia.  Patient has a history of hypertension and hypothyroidism who as per her daughter has been a very active person until recently. Her daughter notes that patient's levothyroxine dose was reduced from 75 mcg to 50 mcg daily and this produced a significant change in the patient's cognition and energy levels.  She reports that it is very unusual for the patient to wake up late and then sleep throughout the day as she is currently doing. Per the patient's daughter she was a very active person, who would do a lot of walking, including walking around the track.   She notes that the patient has been slowly declining over the past year ever since seeing a new provider who started decreasing her thyroid medication. The patient's appetite has been declining as well.    She was seen in the ED for two syncopal episodes. On 09/04/2018 .  She was noted to be hypotensive and hypoxic.  Symptoms were thought to be related to dehydration due to poor oral intake.  Echo showed normal ejection fraction of 60-65% with grade 2 diastolic dysfunction.  Her losartan dose was significantly reduced on discharge.  She was noted to have several episodes of nonsustained VT but her heart rate was in the 50s.  Hemoglobin was noted to be between 7 and 7.5 but transfusion was withheld due to presence of positive Coombs test and outpatient hematology consult was recommended. FOBT was never sent.  She was found to have abnormal CBC from 09/07/2017 -with hemoglobin of 7 MCV of 95, WBC count of 3k with an ANC of 2.3k.  And a platelet count of 175k.  Reticulocyte percentage of 1.9,  absolute reticulocyte count of 45.8k/ul .  She was noted to have a positive Coombs test but her LDH and haptoglobin levels were within normal limits suggesting against overt hemolysis.  ANA negative. Sedimentation rate 138.  CRP within normal limits. Free T4 0.87 Ferritin 857 with 23% iron saturation. Folate 19.5 B12 - 470  She denies recent chest pain on exertion, shortness of breath on minimal exertion, or palpitations. She denies cough. She had not noticed any recent bleeding such as epistaxis, hematuria or hematochezia.   The patient denies over the counter NSAID ingestion. She is not on antiplatelets agents.   She had no prior history or diagnosis of cancer. Her age appropriate screening programs are up-to-date.  She denies any pica and eats a variety of diet.  She never donated blood or received blood transfusion.   Some days her appetite is better than others. The patients daughter states she has also become more forgetful at times.  Pt denies fever, chills, trouble swelling, abdominal pain, or any other symptoms or complaints at this time.  Patient really knows that she still feels somewhat fatigued but has no overt lightheadedness or dizziness at this time and feels better than when she was admitted. No overt infectious issues. No overt evidence of bleeding.  Some unquantified weight loss due to decreased appetite. No other overt focal symptoms.  Interval History:   Wendy [name] is here regarding her borderline macrocytic anemia and leucopenia and her recently diagnosed uterine high grade papillary caricnoma. The patient's  last visit with [place] was on 02/07/19. The pt reports that she is doing well overall. She is accompanied today by her daughter Wendy [name], via [employer].  The pt reports that she has been doing well overall. She notes that she been eating fair, but could eat more. She has lost 5 pounds since April. The pt's daughter notes that the pt has not been eating very well,  and doesn't believe that the pt is drinking much water. The pt's daughter notes that the pt is drinking Ensure and grape juice.   The pt's daughter notes that Megace initially improved the pt's appetite. The pt's daughter notes that the pt has been hiding the Megace pills around the home and hasn't been taking these. The pt notes that she has not wanted to take Megace because she worried it might bother her.   The pt denies abdominal pains. The pt's daughter notes that the pt had one episode of vaginal bleeding 1-2 weeks ago. The pt denies any further vaginal bleeding.   The pt's daughter also notes that the pt has been less active and has not gone on their previous daily walks to the mailbox. The pt notes that she has been less active in general.  Lab results today (04/24/19) of CBC w/diff and CMP is as follows: all values are WNL except for WBC at 2.5k, RBC at 3.25, HGB at 9.9, HCT at 30.4, RDW at 16.1, Lymphs abs at 400, Creatinine at 1.09, GFR at 48. 04/24/19 CA 125 is pending  On review of systems, pt reports eating fair, stable energy levels, being less active, one episode of vaginal bleeding, moving her bowels well, and denies abdominal pains, and any other symptoms.   MEDICAL HISTORY:  Past Medical History:  Diagnosis Date  . History of radiation therapy 02/21/2018-03/31/2018   Uterus, pelvis1.8 Gy in 25 fractions for a total dose of 45 Gy     . Hypertension   . Thyroid disease   Nonsustained VT Hypothyroidism Acute kidney injury 08/2017 Depression-on Celexa.  SURGICAL HISTORY: Past Surgical History:  Procedure Laterality Date  . CORONARY ANGIOPLASTY      SOCIAL HISTORY: Social History   Socioeconomic History  . Marital status: Widowed    Spouse name: Not on file  . Number of children: Not on file  . Years of education: Not on file  . Highest education level: Not on file  Occupational History  . Not on file  Social Needs  . Financial resource strain: Not on file  . Food  insecurity    Worry: Not on file    Inability: Not on file  . Transportation needs    Medical: Not on file    Non-medical: Not on file  Tobacco Use  . Smoking status: Never Smoker  . Smokeless tobacco: Never Used  Substance and Sexual Activity  . Alcohol use: No  . Drug use: No  . Sexual activity: Never  Lifestyle  . Physical activity    Days per week: Not on file    Minutes per session: Not on file  . Stress: Not on file  Relationships  . Social Herbalist on phone: Not on file    Gets together: Not on file    Attends religious service: Not on file    Active member of club or organization: Not on file    Attends meetings of clubs or organizations: Not on file    Relationship status: Not on file  . Intimate  partner violence    Fear of current or ex partner: Not on file    Emotionally abused: Not on file    Physically abused: Not on file    Forced sexual activity: Not on file  Other Topics Concern  . Not on file  Social History Narrative  . Not on file    FAMILY HISTORY: No family history on file.  ALLERGIES:  has No Known Allergies.  MEDICATIONS:  Current Outpatient Medications  Medication Sig Dispense Refill  . aspirin EC 81 MG tablet Take 1 tablet (81 mg total) by mouth daily.    . citalopram (CELEXA) 10 MG tablet Take 5 mg daily after supper by mouth.     . ENSURE (ENSURE) Take 237 mLs by mouth 2 (two) times daily between meals.    . hydrocortisone cream 1 % Apply to affected area 2 times daily 15 g 0  . losartan (COZAAR) 50 MG tablet Take 50 mg by mouth daily.    . megestrol (MEGACE) 40 MG tablet Take 1 tablet (40 mg total) by mouth daily. 30 tablet 1  . SYNTHROID 75 MCG tablet TAKE 1 TABLET(75 MCG) BY MOUTH DAILY BEFORE BREAKFAST 30 tablet 3  . triamcinolone (KENALOG) 0.025 % cream Apply 1 application 2 (two) times daily as needed topically (rash).    . Vitamin D, Ergocalciferol, (DRISDOL) 50000 units CAPS capsule Take 50,000 Units every 7 (seven)  days by mouth.     No current facility-administered medications for this visit.     REVIEW OF SYSTEMS:    A 10+ POINT REVIEW OF SYSTEMS WAS OBTAINED including neurology, dermatology, psychiatry, cardiac, respiratory, lymph, extremities, GI, GU, Musculoskeletal, constitutional, breasts, reproductive, HEENT.  All pertinent positives are noted in the HPI.  All others are negative.   PHYSICAL EXAMINATION: ECOG PERFORMANCE STATUS: 2 - Symptomatic, <50% confined to bed  Vitals:   04/24/19 1358  BP: (!) 149/83  Pulse: 63  Resp: 18  Temp: 98.9 F (37.2 C)  SpO2: 99%   Filed Weights   04/24/19 1358  Weight: 125 lb 3.2 oz (56.8 kg)     GENERAL:alert, in no acute distress and comfortable SKIN: no acute rashes, no significant lesions EYES: conjunctiva are pink and non-injected, sclera anicteric OROPHARYNX: MMM, no exudates, no oropharyngeal erythema or ulceration NECK: supple, no JVD LYMPH:  no palpable lymphadenopathy in the cervical, axillary or inguinal regions LUNGS: clear to auscultation b/l with normal respiratory effort HEART: regular rate & rhythm ABDOMEN:  normoactive bowel sounds , non tender, not distended. No palpable hepatosplenomegaly.  Extremity: no pedal edema PSYCH: alert & oriented x 3 with fluent speech NEURO: no focal motor/sensory deficits   LABORATORY DATA:  I have reviewed the data as listed  . CBC Latest Ref Rng & Units 04/24/2019 02/07/2019 12/26/2018  WBC 4.0 - 10.5 K/uL 2.5(L) 2.0(L) 2.9(L)  Hemoglobin 12.0 - 15.0 g/dL 9.9(L) 10.4(L) 10.7(L)  Hematocrit 36.0 - 46.0 % 30.4(L) 32.9(L) 33.8(L)  Platelets 150 - 400 K/uL 171 168 201  ANC 1.3k . CMP Latest Ref Rng & Units 04/24/2019 02/07/2019 12/26/2018  Glucose 70 - 99 mg/dL 85 90 89  BUN 8 - 23 mg/dL 13 13 13   Creatinine 0.44 - 1.00 mg/dL 1.09(H) 1.08(H) 1.07(H)  Sodium 135 - 145 mmol/L 140 139 141  Potassium 3.5 - 5.1 mmol/L 4.0 4.1 4.5  Chloride 98 - 111 mmol/L 105 104 103  CO2 22 - 32 mmol/L 27 26 27    Calcium 8.9 - 10.3 mg/dL 8.9 9.2  9.5  Total Protein 6.5 - 8.1 g/dL 7.8 8.1 8.3(H)  Total Bilirubin 0.3 - 1.2 mg/dL 0.5 0.4 0.4  Alkaline Phos 38 - 126 U/L 63 63 60  AST 15 - 41 U/L 20 21 20   ALT 0 - 44 U/L 8 8 7         RADIOGRAPHIC STUDIES: I have personally reviewed the radiological images as listed and agreed with the findings in the report. No results found.  ASSESSMENT & PLAN:   85 y.o. female   1) Normocytic Anemia likely multifactorial  hgb improved from 8.1 to 9.5 Likely MDS + blood loss from uterine high grade papillary uterine carcinoma with vaginal bleeding + Anemia of chronic disease related to newly diagnosed malignancy.  Slightly elevated LDH level at 297 but normal haptoglobin suggesting against acute hemolysis.   Homocystine is elevated though B12 level is within normal limits and RBC folate- WNL  10/28/17 MM panel showed no M-Protein, with elevated IgG and IgM. Kappa and lambda light chain were both elevated- likely reactive to newly diagnosed malignancy. No overt evidence of GI bleeding.  No evidence of iron deficiency. Vaginal bleeding has resolved for now Cannot rule out MDS given the patient's advanced age.  2) leukopenia 2.2k with mild neutropenia 1.3k  3)hypothyroidism  -on levothyroxine replacement on 72mcg po daily.  4) Elevated LDH levels -no overt evidence of hemolysis given normal haptoglobin level and no reticulocytosis and normal bilirubin levels.  Likely from uterine papillary carcinoma 5) Positive direct Coombs test - no evidence of overt hemolysis at this time.  6) Recently diagnosed high grade papillary uterine carcinoma  S/p completion of palliative RT. Deemed not a good candidate for chemotherapy. Currently with resolution of vaginal bleeding. Initial CT with no evidence of metastatic disease 12/26/18 CT A/P revealed New solitary liver metastasis in left hepatic lobe. 2. New small right middle and lower lobe pulmonary metastases. 3.  Increased low-attenuation throughout the endometrial cavity of uterus, which could be due to recurrent endometrial carcinoma or benign post treatment hydrometros. 4. Increased soft tissue prominence in left adnexa, raising suspicion for local extra-uterine tumor.  S/p IMRT of 30 Gy in 10 fractions to liver and 20 Gy in 10 fractions to vagina with Dr. Gery Pray between 01/19/19 and 02/03/19  PLAN  -Discussed pt labwork today, 04/24/19; WBC improved to 2.5k. HGB holding at 9.9. Other blood counts are stable. -Continue 40mg  Megace daily -Continue  81mg  Aspirin -Recommend that the pt continue eating as best as she is able to and discussed that this will allow her to maintain her strength -Do not recommend chemotherapy given pt's age and bone marrow reserves. -Discussed that I do not recommend palliative chemotherapy given the patient's age and ability to tolerate chemotherapy -Pt has finished palliative radiation with resolution of vaginal bleeding. -Continue thyroid replacement would not decrease dose even if TSH WNL since patient had cognitive issues with previous attempt at thyroid dose reduction. -Will see the pt back in 10 weeks  5) . Patient Active Problem List   Diagnosis Date Noted  . Uterine carcinoma ([place]) 01/31/2018  . Syncope 09/28/2013  . Altered mental status 09/26/2013  . Syncope and collapse 09/26/2013  . Hypothyroidism 09/26/2013  . Depression 09/26/2013  . HTN (hypertension) 09/26/2013  Plan -continue f/u with PCP   RTC with Dr Irene Limbo with labs in 10 weeks   The total time spent in the appt was 25 minutes and more than 50% was on counseling and direct patient cares.  Sullivan Lone MD [place] AAHIVMS [facility] Hematology/Oncology Physician [facility]  (Office):       [phone] (Work cell):  [phone] (Fax):           [phone]   I, Baldwin Jamaica, am acting as a scribe for Dr. Sullivan Lone.   .I have reviewed the above documentation for accuracy and  completeness, and I agree with the above. Brunetta Genera MD

## 2019-04-24 ENCOUNTER — Telehealth: Payer: Self-pay | Admitting: Hematology

## 2019-04-24 ENCOUNTER — Other Ambulatory Visit: Payer: Self-pay

## 2019-04-24 ENCOUNTER — Inpatient Hospital Stay (HOSPITAL_BASED_OUTPATIENT_CLINIC_OR_DEPARTMENT_OTHER): Payer: Medicare Other | Admitting: Hematology

## 2019-04-24 ENCOUNTER — Inpatient Hospital Stay: Payer: Medicare Other | Attending: Hematology

## 2019-04-24 VITALS — BP 149/83 | HR 63 | Temp 98.9°F | Resp 18 | Ht 63.0 in | Wt 125.2 lb

## 2019-04-24 DIAGNOSIS — Z79899 Other long term (current) drug therapy: Secondary | ICD-10-CM

## 2019-04-24 DIAGNOSIS — R630 Anorexia: Secondary | ICD-10-CM | POA: Insufficient documentation

## 2019-04-24 DIAGNOSIS — D509 Iron deficiency anemia, unspecified: Secondary | ICD-10-CM

## 2019-04-24 DIAGNOSIS — C55 Malignant neoplasm of uterus, part unspecified: Secondary | ICD-10-CM | POA: Diagnosis not present

## 2019-04-24 DIAGNOSIS — R634 Abnormal weight loss: Secondary | ICD-10-CM | POA: Insufficient documentation

## 2019-04-24 DIAGNOSIS — R5383 Other fatigue: Secondary | ICD-10-CM | POA: Diagnosis not present

## 2019-04-24 DIAGNOSIS — Z7982 Long term (current) use of aspirin: Secondary | ICD-10-CM

## 2019-04-24 DIAGNOSIS — E039 Hypothyroidism, unspecified: Secondary | ICD-10-CM | POA: Insufficient documentation

## 2019-04-24 DIAGNOSIS — D72819 Decreased white blood cell count, unspecified: Secondary | ICD-10-CM | POA: Diagnosis not present

## 2019-04-24 DIAGNOSIS — I10 Essential (primary) hypertension: Secondary | ICD-10-CM | POA: Diagnosis not present

## 2019-04-24 DIAGNOSIS — D649 Anemia, unspecified: Secondary | ICD-10-CM | POA: Diagnosis not present

## 2019-04-24 DIAGNOSIS — Z923 Personal history of irradiation: Secondary | ICD-10-CM | POA: Insufficient documentation

## 2019-04-24 DIAGNOSIS — C787 Secondary malignant neoplasm of liver and intrahepatic bile duct: Secondary | ICD-10-CM

## 2019-04-24 LAB — CBC WITH DIFFERENTIAL/PLATELET
Abs Immature Granulocytes: 0.03 10*3/uL (ref 0.00–0.07)
Basophils Absolute: 0 10*3/uL (ref 0.0–0.1)
Basophils Relative: 0 %
Eosinophils Absolute: 0 10*3/uL (ref 0.0–0.5)
Eosinophils Relative: 1 %
HCT: 30.4 % — ABNORMAL LOW (ref 36.0–46.0)
Hemoglobin: 9.9 g/dL — ABNORMAL LOW (ref 12.0–15.0)
Immature Granulocytes: 1 %
Lymphocytes Relative: 14 %
Lymphs Abs: 0.4 10*3/uL — ABNORMAL LOW (ref 0.7–4.0)
MCH: 30.5 pg (ref 26.0–34.0)
MCHC: 32.6 g/dL (ref 30.0–36.0)
MCV: 93.5 fL (ref 80.0–100.0)
Monocytes Absolute: 0.4 10*3/uL (ref 0.1–1.0)
Monocytes Relative: 15 %
Neutro Abs: 1.7 10*3/uL (ref 1.7–7.7)
Neutrophils Relative %: 69 %
Platelets: 171 10*3/uL (ref 150–400)
RBC: 3.25 MIL/uL — ABNORMAL LOW (ref 3.87–5.11)
RDW: 16.1 % — ABNORMAL HIGH (ref 11.5–15.5)
WBC: 2.5 10*3/uL — ABNORMAL LOW (ref 4.0–10.5)
nRBC: 0 % (ref 0.0–0.2)

## 2019-04-24 LAB — CMP (CANCER CENTER ONLY)
ALT: 8 U/L (ref 0–44)
AST: 20 U/L (ref 15–41)
Albumin: 3.5 g/dL (ref 3.5–5.0)
Alkaline Phosphatase: 63 U/L (ref 38–126)
Anion gap: 8 (ref 5–15)
BUN: 13 mg/dL (ref 8–23)
CO2: 27 mmol/L (ref 22–32)
Calcium: 8.9 mg/dL (ref 8.9–10.3)
Chloride: 105 mmol/L (ref 98–111)
Creatinine: 1.09 mg/dL — ABNORMAL HIGH (ref 0.44–1.00)
GFR, Est AFR Am: 48 mL/min — ABNORMAL LOW (ref >60)
GFR, Estimated: 42 mL/min — ABNORMAL LOW (ref >60)
Glucose, Bld: 85 mg/dL (ref 70–99)
Potassium: 4 mmol/L (ref 3.5–5.1)
Sodium: 140 mmol/L (ref 135–145)
Total Bilirubin: 0.5 mg/dL (ref 0.3–1.2)
Total Protein: 7.8 g/dL (ref 6.5–8.1)

## 2019-04-24 NOTE — Telephone Encounter (Signed)
Scheduled appt per 7/6 los. Spoke with patient daughter and she is aware of the appt date and time.

## 2019-05-01 ENCOUNTER — Telehealth: Payer: Self-pay | Admitting: Oncology

## 2019-05-01 NOTE — Telephone Encounter (Signed)
Hilda Blades, patient's daughter, called and said her mother had told [name] at her last appointment that she has not had any vaginal bleeding.  She found out that her mother has been having vaginal bleeding and she wanted to let [name] know.  She also said Sharma has not been eating and also had pain in her lower left side over the weekend.  Hilda Blades is wondering if Dr. Grier Mitts nurse can call her to talk about these issues.

## 2019-05-02 ENCOUNTER — Telehealth: Payer: Self-pay | Admitting: *Deleted

## 2019-05-02 NOTE — Telephone Encounter (Signed)
Daughter called with several concerns:  Patient continues to have poor appetite and low intake of oral fluids. Daughter is giving megace, but not consistently and patient does not always swallow it when given.  Daughter has found blood spotted paper towels in her mother's room that mother is using as pads for vaginal bleeding and blood spots on mother's slacks.  Mother had pain in left lower side over weekend, left her sore. No fever, tenderness at site or diarrhea noted. Informed her that Dr.Kale will be given this information.

## 2019-05-11 ENCOUNTER — Telehealth: Payer: Self-pay | Admitting: Oncology

## 2019-05-11 NOTE — Telephone Encounter (Signed)
Patient's daughter, Hilda Blades, left a message saying that she saw blood stains on the mat her mother was sleeping on yesterday.  She said there was a "good bit of it" and wanted to report it.

## 2019-05-15 ENCOUNTER — Telehealth: Payer: Self-pay | Admitting: Oncology

## 2019-05-15 NOTE — Telephone Encounter (Addendum)
Called Wendy [name] and scheduled follow up on 05/22/19 at 3:30 pm with Dr. Sondra Come. She verbalized agreement and mentioned that Jacarra has started to have urinary incontinence and was confused the other night.  Asked if she could bring her to the cancer center for labs.  Lab apt scheduled for 05/16/19 at 11:30.

## 2019-05-15 NOTE — Telephone Encounter (Signed)
Wendy [name] left a message saying that Wendy [name] continues to have vaginal bleeding.  She also is having pains in her left side which is effecting how she is moving around.  She is wondering if there is anything that can be done to help.  She is requesting a call back.

## 2019-05-16 ENCOUNTER — Other Ambulatory Visit: Payer: Self-pay

## 2019-05-16 ENCOUNTER — Inpatient Hospital Stay: Payer: Medicare Other

## 2019-05-16 ENCOUNTER — Ambulatory Visit: Payer: Medicare Other

## 2019-05-16 ENCOUNTER — Other Ambulatory Visit: Payer: Medicare Other

## 2019-05-16 DIAGNOSIS — R300 Dysuria: Secondary | ICD-10-CM

## 2019-05-16 NOTE — Telephone Encounter (Signed)
[name] called and said Wendy [name] brought the specimen container home with her.  Rescheduled lab appointment before Dr. Clabe Seal appointment on Monday.  [name] verbalized understanding and agreement.

## 2019-05-22 ENCOUNTER — Ambulatory Visit: Payer: Medicare Other | Admitting: Radiation Oncology

## 2019-05-22 ENCOUNTER — Telehealth: Payer: Self-pay | Admitting: Oncology

## 2019-05-22 ENCOUNTER — Other Ambulatory Visit: Payer: Self-pay

## 2019-05-22 ENCOUNTER — Telehealth: Payer: Self-pay | Admitting: *Deleted

## 2019-05-22 ENCOUNTER — Inpatient Hospital Stay: Payer: Medicare Other

## 2019-05-22 DIAGNOSIS — R350 Frequency of micturition: Secondary | ICD-10-CM

## 2019-05-22 NOTE — Telephone Encounter (Signed)
CALLED PATIENT'S DAUGHTER- DEBORAH WITCHEY TO RESCHEDULE FU AND LAB APPT., LVM FOR A RETURN CALL

## 2019-05-22 NOTE — Telephone Encounter (Signed)
Wendy [name] called and said she was not able to get [facility] ready in time for her appointment today.  She would like to reschedule. Left a message with Wendy [name], Medical Secretary to reschedule.

## 2019-05-24 ENCOUNTER — Telehealth: Payer: Self-pay | Admitting: Oncology

## 2019-05-24 NOTE — Telephone Encounter (Signed)
Wendy [name] left a message asking if she could attend the lab and follow up with Dr. Sondra Come on Monday.  Called her back and advised her that she can attend the appointments and a note has been added saying that it is ok for her to attend.  She verbalized agreement.

## 2019-05-26 ENCOUNTER — Other Ambulatory Visit: Payer: Self-pay | Admitting: Hematology

## 2019-05-29 ENCOUNTER — Ambulatory Visit: Payer: Medicare Other

## 2019-05-29 ENCOUNTER — Ambulatory Visit: Payer: Medicare Other | Admitting: Radiation Oncology

## 2019-05-31 ENCOUNTER — Telehealth: Payer: Self-pay | Admitting: Oncology

## 2019-05-31 NOTE — Telephone Encounter (Signed)
[name] called and said Mallisa fell on Thursday going in to their house.  She fell in the grass and did not have any injuries.  Per [name], she has been sleeping more and not eating anything except for Ensures. She also has not come downstairs since the fall, even though she is walking to the bathroom and sitting up in bed. They are going to try and come for her appointment with Dr. Sondra Come tomorrow.

## 2019-06-01 ENCOUNTER — Ambulatory Visit
Admission: RE | Admit: 2019-06-01 | Discharge: 2019-06-01 | Disposition: A | Discharge status: Home / Self Care | Payer: Medicare Other | Source: Ambulatory Visit | Attending: Radiation Oncology | Admitting: Radiation Oncology

## 2019-06-01 ENCOUNTER — Other Ambulatory Visit: Payer: Self-pay

## 2019-06-01 ENCOUNTER — Encounter: Payer: Self-pay | Admitting: Radiation Oncology

## 2019-06-01 VITALS — BP 146/72 | HR 74 | Temp 98.3°F | Resp 16 | Wt 119.6 lb

## 2019-06-01 DIAGNOSIS — Z79899 Other long term (current) drug therapy: Secondary | ICD-10-CM | POA: Insufficient documentation

## 2019-06-01 DIAGNOSIS — Z923 Personal history of irradiation: Secondary | ICD-10-CM | POA: Insufficient documentation

## 2019-06-01 DIAGNOSIS — C541 Malignant neoplasm of endometrium: Secondary | ICD-10-CM | POA: Insufficient documentation

## 2019-06-01 DIAGNOSIS — C55 Malignant neoplasm of uterus, part unspecified: Secondary | ICD-10-CM

## 2019-06-01 NOTE — Progress Notes (Signed)
Radiation Oncology         [phone] ________________________________  Name: Wendy [name] MRN: [id number]  Date: 06/01/2019  DOB: 05/26/1917  Follow-Up Visit Note  CC: Lajean Manes, MD  [name], MD    ICD-10-CM   1. Uterine carcinoma ([place])  C55     Diagnosis:   85 y.o. female with Recurrent endometrial cancer  Interval Since Last Radiation:  4 months   Radiation treatment dates:   01/19/2019 - 02/03/2019 Site/dose:    1. Liver / 30 Gy in 10 fractions 2. Vagina (re-tx) / 20 Gy in 10 fractions  Radiation treatment dates:   02/21/2018-03/31/2018 Site/dose:   Uterus, pelvis / 1.8 Gy x 25 fractions for a total dose of 45 Gy     Narrative:  The patient returns today for routine follow-up.  Patient had a recent fall after attending a family event. She continues to have vaginal bleeding, with 1 inch by 4 inches in size of blood per day per pull up/pad. She has decreased appetite and has not had any Ensure or food today. She does drink Ensure most days, at least 2 per day. She has had a recent decrease in daily activity.                               ALLERGIES:  has No Known Allergies.  Meds: Current Outpatient Medications  Medication Sig Dispense Refill  . citalopram (CELEXA) 10 MG tablet Take 5 mg daily after supper by mouth.     . SYNTHROID 75 MCG tablet TAKE 1 TABLET(75 MCG) BY MOUTH DAILY BEFORE BREAKFAST 30 tablet 3  . aspirin EC 81 MG tablet Take 1 tablet (81 mg total) by mouth daily.    Marland Kitchen ENSURE (ENSURE) Take 237 mLs by mouth 2 (two) times daily between meals.    . hydrocortisone cream 1 % Apply to affected area 2 times daily 15 g 0  . losartan (COZAAR) 50 MG tablet Take 50 mg by mouth daily.    . megestrol (MEGACE) 40 MG tablet Take 1 tablet (40 mg total) by mouth daily. 30 tablet 1  . triamcinolone (KENALOG) 0.025 % cream Apply 1 application 2 (two) times daily as needed topically (rash).    . Vitamin D, Ergocalciferol, (DRISDOL) 50000 units CAPS capsule Take  50,000 Units every 7 (seven) days by mouth.     No current facility-administered medications for this encounter.     Physical Findings: The patient is in no acute distress. Patient is alert and oriented.  weight is 119 lb 9.6 oz (54.3 kg). Her temperature is 98.3 F (36.8 C). Her blood pressure is 146/72 (abnormal) and her pulse is 74. Her respiration is 16 and oxygen saturation is 100%.   Lungs are clear to auscultation bilaterally. Heart has regular rate and rhythm. No palpable cervical, supraclavicular, or axillary adenopathy. Abdomen soft, non-tender, normal bowel sounds.  She is sitting comfortably in wheelchair.  No significant edema noted in the lower extremities.  Palpation along the extremities, chest and abdomen area reveals no areas of point tenderness.  Lab Findings: Lab Results  Component Value Date   WBC 2.5 (L) 04/24/2019   HGB 9.9 (L) 04/24/2019   HCT 30.4 (L) 04/24/2019   MCV 93.5 04/24/2019   PLT 171 04/24/2019    Radiographic Findings: No results found.  Impression:  Recurrent endometrial cancer.  Patient is feeling a little better since her fall several days  ago.  She and her daughter are now realizing that they do need help at home and we will place referral for home health evaluation and management.  Patient continues to have some vaginal bleeding.  I discussed that she may potentially be a candidate for vaginal brachii therapy for this issue but in the past she is decided against this treatment approach.  Patient came into late today for urinalysis culture and sensitivity.  I have strongly encouraged the daughter to finish off the downstairs bedroom so that patient does not have to ambulate 13 steps to get to her bedroom.  Have encouraged the patient to resume taking her Megace which will help with her appetite issues.  Plan: Follow-up in 10 days if possible.  The patient will come in earlier to check for blood work as well as urinalysis culture and sensitivity if  indicated.  Home health referral as above.  ____________________________________  Blair Promise, PhD, MD  This document serves as a record of services personally performed by Gery Pray, MD. It was created on his behalf by Rae Lips, a trained medical scribe. The creation of this record is based on the scribe's personal observations and the provider's statements to them. This document has been checked and approved by the attending provider.

## 2019-06-01 NOTE — Patient Instructions (Signed)
Coronavirus (COVID-19) Are you at risk?  Are you at risk for the Coronavirus (COVID-19)?  To be considered HIGH RISK for Coronavirus (COVID-19), you have to meet the following criteria:  . Traveled to [place], [place], [place], [place] or [place]; or in the [place] to [place], [place], [place], or [place]; and have fever, cough, and shortness of breath within the last 2 weeks of travel OR . Been in close contact with a person diagnosed with COVID-19 within the last 2 weeks and have fever, cough, and shortness of breath . IF YOU DO NOT MEET THESE CRITERIA, YOU ARE CONSIDERED LOW RISK FOR COVID-19.  What to do if you are HIGH RISK for COVID-19?  . If you are having a medical emergency, call 911. . Seek medical care right away. Before you go to a doctor's office, urgent care or emergency department, call ahead and tell them about your recent travel, contact with someone diagnosed with COVID-19, and your symptoms. You should receive instructions from your physician's office regarding next steps of care.  . When you arrive at healthcare provider, tell the healthcare staff immediately you have returned from visiting [place], [place], [place], [place] or [place]; or traveled in the [place] to [place], [place], [place], or [place]; in the last two weeks or you have been in close contact with a person diagnosed with COVID-19 in the last 2 weeks.   . Tell the health care staff about your symptoms: fever, cough and shortness of breath. . After you have been seen by a medical provider, you will be either: o Tested for (COVID-19) and discharged home on quarantine except to seek medical care if symptoms worsen, and asked to  - Stay home and avoid contact with others until you get your results (4-5 days)  - Avoid travel on public transportation if possible (such as bus, train, or airplane) or o Sent to the Emergency Department by EMS for evaluation, COVID-19 testing, and possible  admission depending on your condition and test results.  What to do if you are LOW RISK for COVID-19?  Reduce your risk of any infection by using the same precautions used for avoiding the common cold or flu:  . Wash your hands often with soap and warm water for at least 20 seconds.  If soap and water are not readily available, use an alcohol-based hand sanitizer with at least 60% alcohol.  . If coughing or sneezing, cover your mouth and nose by coughing or sneezing into the elbow areas of your shirt or coat, into a tissue or into your sleeve (not your hands). . Avoid shaking hands with others and consider head nods or verbal greetings only. . Avoid touching your eyes, nose, or mouth with unwashed hands.  . Avoid close contact with people who are sick. . Avoid places or events with large numbers of people in one location, like concerts or sporting events. . Carefully consider travel plans you have or are making. . If you are planning any travel outside or inside the [place], visit the CDC's Travelers' Health webpage for the latest health notices. . If you have some symptoms but not all symptoms, continue to monitor at home and seek medical attention if your symptoms worsen. . If you are having a medical emergency, call 911.   ADDITIONAL HEALTHCARE OPTIONS FOR PATIENTS  [facility] / e-Visit: [email or web address]         [facility]: [phone]  [facility]: [phone]                   [facility]: [phone]   

## 2019-06-01 NOTE — Progress Notes (Signed)
Pt presents today for f/u with Dr. Sondra Come. Pt had recent fall after attending family event. Pt continues to have vaginal bleeding, with an inch by 4 inches in size of blood per day per pull up/pad. Pt with decreased appetite and has not had any Ensure or food today. Pt does drink Ensure most days, 2 per day. Pt has had a recent decrease in daily activity.   BP (!) 146/72 (BP Location: Right Arm, Patient Position: Sitting, Cuff Size: Normal)   Pulse 74   Temp 98.3 F (36.8 C)   Resp 16   Wt 119 lb 9.6 oz (54.3 kg)   SpO2 100%   BMI 21.19 kg/m   Wt Readings from Last 3 Encounters:  06/01/19 119 lb 9.6 oz (54.3 kg)  04/24/19 125 lb 3.2 oz (56.8 kg)  02/07/19 130 lb 1.6 oz (59 kg)   Brandt Loosen RN

## 2019-06-02 ENCOUNTER — Other Ambulatory Visit: Payer: Self-pay | Admitting: Radiation Oncology

## 2019-06-02 ENCOUNTER — Telehealth: Payer: Self-pay | Admitting: Oncology

## 2019-06-02 DIAGNOSIS — C541 Malignant neoplasm of endometrium: Secondary | ICD-10-CM

## 2019-06-02 NOTE — Telephone Encounter (Signed)
Wendy [name] called and said that Wendy [name] is complaining of stomach pain this morning and refused to eat anything.  Wendy [name] said she is alert and not confused.  Asked if she would like to come in and see the Symptom Management PA at the [facility] or to go to the ER.  She asked Wendy [name] and she refused both options.  Wendy [name] to have her rest for awhile to see if the pain improves and that she can bring her to the ER over the weekend if the stomach pain worsens or call the doctor on call.  She verbalized understanding and agreement.

## 2019-06-05 ENCOUNTER — Telehealth: Payer: Self-pay | Admitting: *Deleted

## 2019-06-05 NOTE — Telephone Encounter (Signed)
CALLED PATIENT'S DAUGHTER, DEBORAH WITCHEY TO INFORM OF LAB AND FU ON 06-20-19 - LAB @ 2 PM AND FU WITH DR. KINARD ON 06-20-19 @ 2:30 PM, SPOKE WITH PATIENT'S DAUGHTER DEBORAH WITCHEY AND SHE IS AWARE OF THESE APPTS. AND SHE IS GOOD WITH THEM

## 2019-06-06 ENCOUNTER — Emergency Department (HOSPITAL_COMMUNITY)
Admission: EM | Admit: 2019-06-06 | Discharge: 2019-06-07 | Disposition: A | Discharge status: Home / Self Care | Payer: Medicare Other | Attending: Emergency Medicine | Admitting: Emergency Medicine

## 2019-06-06 ENCOUNTER — Emergency Department (HOSPITAL_COMMUNITY): Payer: Medicare Other

## 2019-06-06 ENCOUNTER — Other Ambulatory Visit: Payer: Self-pay

## 2019-06-06 ENCOUNTER — Encounter (HOSPITAL_COMMUNITY): Payer: Self-pay | Admitting: Emergency Medicine

## 2019-06-06 DIAGNOSIS — E039 Hypothyroidism, unspecified: Secondary | ICD-10-CM | POA: Diagnosis not present

## 2019-06-06 DIAGNOSIS — Z7982 Long term (current) use of aspirin: Secondary | ICD-10-CM | POA: Insufficient documentation

## 2019-06-06 DIAGNOSIS — D500 Iron deficiency anemia secondary to blood loss (chronic): Secondary | ICD-10-CM | POA: Insufficient documentation

## 2019-06-06 DIAGNOSIS — Z79899 Other long term (current) drug therapy: Secondary | ICD-10-CM | POA: Diagnosis not present

## 2019-06-06 DIAGNOSIS — R531 Weakness: Secondary | ICD-10-CM | POA: Diagnosis present

## 2019-06-06 DIAGNOSIS — I10 Essential (primary) hypertension: Secondary | ICD-10-CM | POA: Insufficient documentation

## 2019-06-06 LAB — COMPREHENSIVE METABOLIC PANEL
ALT: 13 U/L (ref 0–44)
AST: 45 U/L — ABNORMAL HIGH (ref 15–41)
Albumin: 3.5 g/dL (ref 3.5–5.0)
Alkaline Phosphatase: 69 U/L (ref 38–126)
Anion gap: 12 (ref 5–15)
BUN: 26 mg/dL — ABNORMAL HIGH (ref 8–23)
CO2: 26 mmol/L (ref 22–32)
Calcium: 10.1 mg/dL (ref 8.9–10.3)
Chloride: 105 mmol/L (ref 98–111)
Creatinine, Ser: 1.13 mg/dL — ABNORMAL HIGH (ref 0.44–1.00)
GFR calc Af Amer: 46 mL/min — ABNORMAL LOW (ref >60)
GFR calc non Af Amer: 40 mL/min — ABNORMAL LOW (ref >60)
Glucose, Bld: 145 mg/dL — ABNORMAL HIGH (ref 70–99)
Potassium: 3.5 mmol/L (ref 3.5–5.1)
Sodium: 143 mmol/L (ref 135–145)
Total Bilirubin: 0.6 mg/dL (ref 0.3–1.2)
Total Protein: 7.9 g/dL (ref 6.5–8.1)

## 2019-06-06 LAB — CBC
HCT: 26.3 % — ABNORMAL LOW (ref 36.0–46.0)
Hemoglobin: 8.3 g/dL — ABNORMAL LOW (ref 12.0–15.0)
MCH: 30.5 pg (ref 26.0–34.0)
MCHC: 31.6 g/dL (ref 30.0–36.0)
MCV: 96.7 fL (ref 80.0–100.0)
Platelets: 180 10*3/uL (ref 150–400)
RBC: 2.72 MIL/uL — ABNORMAL LOW (ref 3.87–5.11)
RDW: 15.9 % — ABNORMAL HIGH (ref 11.5–15.5)
WBC: 4.8 10*3/uL (ref 4.0–10.5)
nRBC: 0 % (ref 0.0–0.2)

## 2019-06-06 LAB — CBG MONITORING, ED
Glucose-Capillary: 133 mg/dL — ABNORMAL HIGH (ref 70–99)
Glucose-Capillary: 139 mg/dL — ABNORMAL HIGH (ref 70–99)

## 2019-06-06 MED ORDER — SODIUM CHLORIDE 0.9% FLUSH: 3.0000 mL | Freq: Once | INTRAVENOUS | Status: DC

## 2019-06-06 MED ORDER — SODIUM CHLORIDE 0.9 % IV BOLUS
500.0000 mL | Freq: Once | INTRAVENOUS | Status: AC
  Administered 2019-06-07: 500 mL via INTRAVENOUS

## 2019-06-06 NOTE — ED Notes (Signed)
Patient transport to CT

## 2019-06-06 NOTE — ED Triage Notes (Addendum)
Patient brought in by [facility]. Patient comes from home complaining of generalized weakness. Patient has a hx of thyroid disease, uterine cancer, and hypertension. Patient was having trouble going upstairs after dinner but was not having problems coming down them before dinner.

## 2019-06-06 NOTE — ED Provider Notes (Signed)
[facility] DEPT Provider Note  CSN: [id number] Arrival date & time: 06/06/19 2212  Chief Complaint(s) Weakness  HPI Wendy [name] is a 85 y.o. female   HPI Here for generalized fatigue while walking up the stairs. Improved now. Associated nausea. No emesis.No focal deficits. No other alleviating or aggravating factors. No recent fevers or infections. No chest pain or SOB. H/o uterine cancer s/p radiation. Has been having vag bleeding again from cancer for several weeks after radiation stopped. Has been improving for a couple of weeks. Has had decreased oral intake due to lack of appetite for several weeks. Only ate small bites tonight. Was complaining about being thirsty.      Past Medical History Past Medical History:  Diagnosis Date   History of radiation therapy 02/21/2018-03/31/2018   Uterus, pelvis1.8 Gy in 25 fractions for a total dose of 45 Gy      Hypertension    Thyroid disease    Patient Active Problem List   Diagnosis Date Noted   Uterine carcinoma ([place]) 01/31/2018   Syncope 09/28/2013   Altered mental status 09/26/2013   Syncope and collapse 09/26/2013   Hypothyroidism 09/26/2013   Depression 09/26/2013   HTN (hypertension) 09/26/2013   Home Medication(s) Prior to Admission medications   Medication Sig Start Date End Date Taking? Authorizing Provider  aspirin EC 81 MG tablet Take 1 tablet (81 mg total) by mouth daily. 02/07/19   Brunetta Genera, MD  citalopram (CELEXA) 10 MG tablet Take 5 mg daily after supper by mouth.  08/24/13   [provider]  ENSURE (ENSURE) Take 237 mLs by mouth 2 (two) times daily between meals.    [provider]  hydrocortisone cream 1 % Apply to affected area 2 times daily 12/30/12   Palumbo, April, MD  losartan (COZAAR) 50 MG tablet Take 50 mg by mouth daily.    [provider]  megestrol (MEGACE) 40 MG tablet Take 1 tablet (40 mg total) by mouth daily.  02/07/19   Brunetta Genera, MD  SYNTHROID 75 MCG tablet TAKE 1 TABLET(75 MCG) BY MOUTH DAILY BEFORE BREAKFAST 05/26/19   Brunetta Genera, MD  triamcinolone (KENALOG) 0.025 % cream Apply 1 application 2 (two) times daily as needed topically (rash).    [provider]  Vitamin D, Ergocalciferol, (DRISDOL) 50000 units CAPS capsule Take 50,000 Units every 7 (seven) days by mouth.    [provider]                                                                                                                                    Past Surgical History Past Surgical History:  Procedure Laterality Date   CORONARY ANGIOPLASTY     Family History History reviewed. No pertinent family history.  Social History Social History   Tobacco Use   Smoking status: Never Smoker   Smokeless tobacco: Never Used  Substance Use  Topics   Alcohol use: No   Drug use: No   Allergies Patient has no known allergies.  Review of Systems Review of Systems All other systems are reviewed and are negative for acute change except as noted in the HPI  Physical Exam Vital Signs  I have reviewed the triage vital signs BP (!) 142/76    Pulse 73    Temp (!) 97 F (36.1 C) (Oral)    Resp 18    Ht 5\' 2"  (1.575 m)    Wt 54.2 kg    SpO2 97%    BMI 21.88 kg/m   Physical Exam Vitals signs reviewed.  Constitutional:      General: She is not in acute distress.    Appearance: She is well-developed. She is not diaphoretic.  HENT:     Head: Normocephalic and atraumatic.     Nose: Nose normal.  Eyes:     General: No scleral icterus.       Right eye: No discharge.        Left eye: No discharge.     Conjunctiva/sclera: Conjunctivae normal.     Pupils: Pupils are equal, round, and reactive to light.  Neck:     Musculoskeletal: Normal range of motion and neck supple.  Cardiovascular:     Rate and Rhythm: Normal rate. Rhythm regularly irregular.     Heart sounds: No murmur. No friction rub. No  gallop.   Pulmonary:     Effort: Pulmonary effort is normal. No respiratory distress.     Breath sounds: Normal breath sounds. No stridor. No rales.  Abdominal:     General: There is no distension.     Palpations: Abdomen is soft.     Tenderness: There is no abdominal tenderness.  Musculoskeletal:        General: No tenderness.  Skin:    General: Skin is warm and dry.     Findings: No erythema or rash.  Neurological:     Mental Status: She is alert and oriented to person, place, and time.     ED Results and Treatments Labs (all labs ordered are listed, but only abnormal results are displayed) Labs Reviewed  CBC - Abnormal; Notable for the following components:      Result Value   RBC 2.72 (*)    Hemoglobin 8.3 (*)    HCT 26.3 (*)    RDW 15.9 (*)    All other components within normal limits  COMPREHENSIVE METABOLIC PANEL - Abnormal; Notable for the following components:   Glucose, Bld 145 (*)    BUN 26 (*)    Creatinine, Ser 1.13 (*)    AST 45 (*)    GFR calc non Af Amer 40 (*)    GFR calc Af Amer 46 (*)    All other components within normal limits  BRAIN NATRIURETIC PEPTIDE - Abnormal; Notable for the following components:   B Natriuretic Peptide 279.9 (*)    All other components within normal limits  LACTIC ACID, PLASMA - Abnormal; Notable for the following components:   Lactic Acid, Venous 2.0 (*)    All other components within normal limits  T4, FREE - Abnormal; Notable for the following components:   Free T4 1.14 (*)    All other components within normal limits  CBG MONITORING, ED - Abnormal; Notable for the following components:   Glucose-Capillary 133 (*)    All other components within normal limits  CBG MONITORING, ED - Abnormal; Notable for the  following components:   Glucose-Capillary 139 (*)    All other components within normal limits  LACTIC ACID, PLASMA  TSH  URINALYSIS, ROUTINE W REFLEX MICROSCOPIC                                                                                                                          EKG  EKG Interpretation  Date/Time:  Tuesday June 06 2019 22:44:51 EDT Ventricular Rate:  67 PR Interval:    QRS Duration: 136 QT Interval:  437 QTC Calculation: 462 R Axis:   -22 Text Interpretation:  Sinus rhythm Atrial premature complexes Probable left atrial enlargement Right bundle branch block Inferior infarct, age indeterminate Lateral leads are also involved Confirmed by Dene Gentry [phone]) on 06/06/2019 10:51:22 PM Also confirmed by Dene Gentry [phone]), editor Philomena Doheny [phone])  on 06/07/2019 7:13:06 AM      Radiology Ct Head Wo Contrast  Result Date: 06/07/2019 CLINICAL DATA:  Fatigue and malaise, generalized weakness EXAM: CT HEAD WITHOUT CONTRAST TECHNIQUE: Contiguous axial images were obtained from the base of the skull through the vertex without intravenous contrast. COMPARISON:  CT 09/04/2017 FINDINGS: Brain: No evidence of acute infarction, hemorrhage, hydrocephalus, extra-axial collection or mass lesion/mass effect. Symmetric prominence of the ventricles, cisterns and sulci compatible with parenchymal volume loss. Patchy areas of white matter hypoattenuation are most compatible with chronic microvascular angiopathy. 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 Vascular: Atherosclerotic calcification of the carotid siphons and intradural vertebral arteries. Skull: No calvarial fracture or suspicious osseous lesion. No scalp swelling or hematoma. Sinuses/Orbits: Paranasal sinuses and mastoid air cells are predominantly clear. Orbital structures are unremarkable. Other: None. IMPRESSION: 1. No acute intracranial abnormality. 2. Generalized parenchymal volume loss and chronic microvascular ischemic changes. 3. 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 Electronically Signed   By: Lovena Le M.D.   On: 06/07/2019 00:40    Pertinent labs & imaging results that  were available during my care of the patient were reviewed by me and considered in my medical decision making (see chart for details).  Medications Ordered in ED Medications  sodium chloride 0.9 % bolus 500 mL (0 mLs Intravenous Stopped 06/07/19 0203)  sodium chloride 0.9 % bolus 500 mL (0 mLs Intravenous Stopped 06/07/19 0715)  Procedures Procedures  (including critical care time)  Medical Decision Making / ED Course I have reviewed the nursing notes for this encounter and the patient's prior records (if available in EHR or on provided paperwork).   Wendy [name] was evaluated in Emergency Department on 06/07/2019 for the symptoms described in the history of present illness. She was evaluated in the context of the global COVID-19 pandemic, which necessitated consideration that the patient might be at risk for infection with the SARS-CoV-2 virus that causes COVID-19. Institutional protocols and algorithms that pertain to the evaluation of patients at risk for COVID-19 are in a state of rapid change based on information released by regulatory bodies including the CDC and federal and state organizations. These policies and algorithms were followed during the patient's care in the ED.  Work up notable for evidence of dehydration. Likely from decreased oral intake. Patient also noted to have anemia from vaginal bleeding.   Given IVF. Able to tolerate oral intake. Ambulate at baseline.  The patient is safe for discharge with strict return precautions.  Close hem/onc follow up. Case management consulted for home health.        Final Clinical Impression(s) / ED Diagnoses Final diagnoses:  Generalized weakness  Iron deficiency anemia due to chronic blood loss     The patient appears reasonably screened and/or stabilized for discharge and I doubt any other  medical condition or other [facility] requiring further screening, evaluation, or treatment in the ED at this time prior to discharge.  Disposition: Discharge  Condition: Good  I have discussed the results, Dx and Tx plan with the patient who expressed understanding and agree(s) with the plan. Discharge instructions discussed at great length. The patient was given strict return precautions who verbalized understanding of the instructions. No further questions at time of discharge.    ED Discharge Orders    None       Follow Up: Brunetta Genera, [place] [address] [phone]  Schedule an appointment as soon as possible for a visit  in 3-5 days for repeat hemoglobin and assess for recurrent vaginal bleeding due to uterine cancer     This chart was dictated using voice recognition software.  Despite best efforts to proofread,  errors can occur which can change the documentation meaning.   Fatima Blank, MD 06/07/19 [phone]

## 2019-06-07 LAB — TSH: TSH: 1.044 u[IU]/mL (ref 0.350–4.500)

## 2019-06-07 LAB — LACTIC ACID, PLASMA
Lactic Acid, Venous: 2 mmol/L (ref 0.5–1.9)
Lactic Acid, Venous: 1.5 mmol/L (ref 0.5–1.9)

## 2019-06-07 LAB — BRAIN NATRIURETIC PEPTIDE: B Natriuretic Peptide: 279.9 pg/mL — ABNORMAL HIGH (ref 0.0–100.0)

## 2019-06-07 LAB — T4, FREE: Free T4: 1.14 ng/dL — ABNORMAL HIGH (ref 0.61–1.12)

## 2019-06-07 MED ORDER — SODIUM CHLORIDE 0.9 % IV BOLUS
500.0000 mL | Freq: Once | INTRAVENOUS | Status: AC
  Administered 2019-06-07: 500 mL via INTRAVENOUS

## 2019-06-07 NOTE — Discharge Instructions (Signed)
Case management will call to help assist with home health.

## 2019-06-07 NOTE — TOC Initial Note (Addendum)
Transition of Care [facility]) - Initial/Assessment Note    Patient Details  Name: Wendy [name] MRN: [id number] Date of Birth: January 21, 1917  [facility]) CM/SW Contact:    Erenest Rasher, RN Phone Number: [phone] 06/07/2019, 4:12 PM  Clinical Narrative:                 Spoke to pt's daughter and states pt has dementia. Offered choice for [facility]. Pt agreeable to [facility]. Contacted [facility] with new referral.  [facility] Unable to accept referral. Will check with [facility].    Expected Discharge Plan: [place] Barriers to Discharge: No Barriers Identified   Patient Goals and CMS Choice Patient states their goals for this hospitalization and ongoing recovery are:: needs assistant with ADLs CMS Medicare.gov Compare Post Acute Care list provided to:: Patient Choice offered to / list presented to : Patient  Expected Discharge Plan and Services Expected Discharge Plan: [place]   Discharge Planning Services: CM Consult Post Acute Care Choice: [place] arrangements for the past 2 months: Single Family Home                                      Prior Living Arrangements/Services Living arrangements for the past 2 months: Single Family Home Lives with:: Adult Children Patient language and need for interpreter reviewed:: Yes Do you feel safe going back to the place where you live?: Yes      Need for Family Participation in Patient Care: Yes (Comment) Care giver support system in place?: Yes (comment) Current home services: DME(rolling walker) Criminal Activity/Legal Involvement Pertinent to Current Situation/Hospitalization: No - Comment as needed  Activities of Daily Living      Permission Sought/Granted   Permission granted to share information with : Yes, Verbal Permission Granted  Share Information with NAME: [name]  Permission granted to share info w AGENCY: [place] granted to  share info w Relationship: daughter  Permission granted to share info w Contact Information: [phone]  Emotional Assessment           Psych Involvement: No (comment)  Admission diagnosis:  Weakness Patient Active Problem List   Diagnosis Date Noted  . Uterine carcinoma ([place]) 01/31/2018  . Syncope 09/28/2013  . Altered mental status 09/26/2013  . Syncope and collapse 09/26/2013  . Hypothyroidism 09/26/2013  . Depression 09/26/2013  . HTN (hypertension) 09/26/2013   PCP:  Lajean Manes, MD Pharmacy:   [facility] [place], [place] [facility] [place] [address] [place] [address] Phone: [phone] Fax: [phone]     Social Determinants of Health (SDOH) Interventions    Readmission Risk Interventions No flowsheet data found.

## 2019-06-07 NOTE — ED Notes (Signed)
Date and time results received: 06/07/19 0021 (use smartphrase ".now" to insert current time)  Test: lactic Acid Critical Value: 2.0  Name of Provider Notified:Cardama MD  Orders Received? Or Actions Taken?: Waiting on orders

## 2019-06-07 NOTE — ED Notes (Signed)
Spoke with Shellee Milo, MD about the urgency of obtaining urine. He stated patient didn't need to be catheterized for urine. Also, he wants the lactic to be down after the second 570mL bolus.

## 2019-06-08 NOTE — Progress Notes (Signed)
TOC CM contacted [facility] and they can accept referral. Contacted Dtr, [name] to give information on [facility]. Explained [place] agency will call to arrange appointment. Jonnie Finner RN CCM Case Mgmt phone [phone]

## 2019-06-09 ENCOUNTER — Telehealth: Payer: Self-pay | Admitting: Oncology

## 2019-06-09 NOTE — Telephone Encounter (Signed)
Wendy [name] called and said that Wendy [name] went to the ER earlier in the week because she was very weak and she couldn't go down the stairs.  They found that she is anemic and Wendy [name] was wondering if there is anything she needs to be doing.  Advised her to try getting Wendy [name] to eat foods rich in iron and to keep her follow up with Dr. Sondra Come on Tuesday.  Wendy [name] also asked if they can get a hospital bed.  The room downstairs is almost finished and she said she will need a hospital bed.

## 2019-06-12 ENCOUNTER — Other Ambulatory Visit: Payer: Self-pay | Admitting: Oncology

## 2019-06-12 ENCOUNTER — Encounter: Payer: Self-pay | Admitting: Oncology

## 2019-06-12 DIAGNOSIS — C55 Malignant neoplasm of uterus, part unspecified: Secondary | ICD-10-CM

## 2019-06-12 NOTE — Progress Notes (Signed)
Faxed hospital bed order to [facility] at [phone].

## 2019-06-12 NOTE — Progress Notes (Signed)
Called Wendy [name] and let her know the hospital bed order has been sent to [facility].  She said the downstairs room should be finished today. She also mentioned that she had a "bad scare" this morning where [name] got up and walked to another room without her knowing.  She is worried about her falling and wants to get the hospital bed as soon as possible so that [name] can be moved downstairs.

## 2019-06-13 ENCOUNTER — Observation Stay (HOSPITAL_COMMUNITY): Payer: Medicare Other

## 2019-06-13 ENCOUNTER — Emergency Department (HOSPITAL_COMMUNITY): Payer: Medicare Other

## 2019-06-13 ENCOUNTER — Telehealth: Payer: Self-pay | Admitting: Oncology

## 2019-06-13 ENCOUNTER — Other Ambulatory Visit: Payer: Self-pay | Admitting: Oncology

## 2019-06-13 ENCOUNTER — Encounter (HOSPITAL_COMMUNITY): Payer: Self-pay | Admitting: Internal Medicine

## 2019-06-13 ENCOUNTER — Inpatient Hospital Stay (HOSPITAL_COMMUNITY)
Admission: EM | Admit: 2019-06-13 | Discharge: 2019-06-20 | DRG: 884 | Disposition: A | Discharge status: Hospice | Payer: Medicare Other | Attending: Internal Medicine | Admitting: Internal Medicine

## 2019-06-13 ENCOUNTER — Other Ambulatory Visit: Payer: Self-pay

## 2019-06-13 ENCOUNTER — Telehealth: Payer: Self-pay | Admitting: *Deleted

## 2019-06-13 DIAGNOSIS — Z8542 Personal history of malignant neoplasm of other parts of uterus: Secondary | ICD-10-CM

## 2019-06-13 DIAGNOSIS — I160 Hypertensive urgency: Secondary | ICD-10-CM | POA: Diagnosis present

## 2019-06-13 DIAGNOSIS — F0390 Unspecified dementia without behavioral disturbance: Secondary | ICD-10-CM | POA: Diagnosis not present

## 2019-06-13 DIAGNOSIS — E876 Hypokalemia: Secondary | ICD-10-CM | POA: Diagnosis not present

## 2019-06-13 DIAGNOSIS — D696 Thrombocytopenia, unspecified: Secondary | ICD-10-CM | POA: Diagnosis present

## 2019-06-13 DIAGNOSIS — I10 Essential (primary) hypertension: Secondary | ICD-10-CM | POA: Diagnosis present

## 2019-06-13 DIAGNOSIS — Z923 Personal history of irradiation: Secondary | ICD-10-CM

## 2019-06-13 DIAGNOSIS — Z515 Encounter for palliative care: Secondary | ICD-10-CM | POA: Diagnosis present

## 2019-06-13 DIAGNOSIS — C55 Malignant neoplasm of uterus, part unspecified: Secondary | ICD-10-CM

## 2019-06-13 DIAGNOSIS — R627 Adult failure to thrive: Secondary | ICD-10-CM | POA: Diagnosis not present

## 2019-06-13 DIAGNOSIS — R531 Weakness: Secondary | ICD-10-CM

## 2019-06-13 DIAGNOSIS — I2510 Atherosclerotic heart disease of native coronary artery without angina pectoris: Secondary | ICD-10-CM | POA: Diagnosis present

## 2019-06-13 DIAGNOSIS — R32 Unspecified urinary incontinence: Secondary | ICD-10-CM | POA: Diagnosis present

## 2019-06-13 DIAGNOSIS — G9340 Encephalopathy, unspecified: Secondary | ICD-10-CM | POA: Diagnosis present

## 2019-06-13 DIAGNOSIS — T68XXXA Hypothermia, initial encounter: Secondary | ICD-10-CM

## 2019-06-13 DIAGNOSIS — G92 Toxic encephalopathy: Secondary | ICD-10-CM | POA: Diagnosis present

## 2019-06-13 DIAGNOSIS — E039 Hypothyroidism, unspecified: Secondary | ICD-10-CM | POA: Diagnosis present

## 2019-06-13 DIAGNOSIS — Z66 Do not resuscitate: Secondary | ICD-10-CM | POA: Diagnosis present

## 2019-06-13 DIAGNOSIS — R4182 Altered mental status, unspecified: Secondary | ICD-10-CM

## 2019-06-13 DIAGNOSIS — E860 Dehydration: Secondary | ICD-10-CM | POA: Diagnosis present

## 2019-06-13 DIAGNOSIS — D630 Anemia in neoplastic disease: Secondary | ICD-10-CM | POA: Diagnosis present

## 2019-06-13 DIAGNOSIS — Z79890 Hormone replacement therapy (postmenopausal): Secondary | ICD-10-CM

## 2019-06-13 DIAGNOSIS — Z79899 Other long term (current) drug therapy: Secondary | ICD-10-CM

## 2019-06-13 DIAGNOSIS — R109 Unspecified abdominal pain: Secondary | ICD-10-CM

## 2019-06-13 DIAGNOSIS — R630 Anorexia: Secondary | ICD-10-CM | POA: Diagnosis present

## 2019-06-13 DIAGNOSIS — Z8249 Family history of ischemic heart disease and other diseases of the circulatory system: Secondary | ICD-10-CM

## 2019-06-13 DIAGNOSIS — C541 Malignant neoplasm of endometrium: Secondary | ICD-10-CM | POA: Diagnosis present

## 2019-06-13 DIAGNOSIS — D649 Anemia, unspecified: Secondary | ICD-10-CM | POA: Diagnosis present

## 2019-06-13 DIAGNOSIS — R0600 Dyspnea, unspecified: Secondary | ICD-10-CM

## 2019-06-13 DIAGNOSIS — Z20828 Contact with and (suspected) exposure to other viral communicable diseases: Secondary | ICD-10-CM | POA: Diagnosis present

## 2019-06-13 DIAGNOSIS — Z7982 Long term (current) use of aspirin: Secondary | ICD-10-CM

## 2019-06-13 DIAGNOSIS — Z9861 Coronary angioplasty status: Secondary | ICD-10-CM

## 2019-06-13 DIAGNOSIS — Z681 Body mass index (BMI) 19 or less, adult: Secondary | ICD-10-CM

## 2019-06-13 HISTORY — DX: Other constipation: K59.09

## 2019-06-13 HISTORY — DX: Malignant neoplasm of uterus, part unspecified: C55

## 2019-06-13 LAB — COMPREHENSIVE METABOLIC PANEL
ALT: 14 U/L (ref 0–44)
AST: 65 U/L — ABNORMAL HIGH (ref 15–41)
Albumin: 3.2 g/dL — ABNORMAL LOW (ref 3.5–5.0)
Alkaline Phosphatase: 100 U/L (ref 38–126)
Anion gap: 13 (ref 5–15)
BUN: 26 mg/dL — ABNORMAL HIGH (ref 8–23)
CO2: 25 mmol/L (ref 22–32)
Calcium: 10.3 mg/dL (ref 8.9–10.3)
Chloride: 111 mmol/L (ref 98–111)
Creatinine, Ser: 1.1 mg/dL — ABNORMAL HIGH (ref 0.44–1.00)
GFR calc Af Amer: 48 mL/min — ABNORMAL LOW (ref >60)
GFR calc non Af Amer: 41 mL/min — ABNORMAL LOW (ref >60)
Glucose, Bld: 93 mg/dL (ref 70–99)
Potassium: 3 mmol/L — ABNORMAL LOW (ref 3.5–5.1)
Sodium: 149 mmol/L — ABNORMAL HIGH (ref 135–145)
Total Bilirubin: 1.3 mg/dL — ABNORMAL HIGH (ref 0.3–1.2)
Total Protein: 7.2 g/dL (ref 6.5–8.1)

## 2019-06-13 LAB — CBC WITH DIFFERENTIAL/PLATELET
Abs Immature Granulocytes: 0 10*3/uL (ref 0.00–0.07)
Basophils Absolute: 0 10*3/uL (ref 0.0–0.1)
Basophils Relative: 0 %
Eosinophils Absolute: 0.1 10*3/uL (ref 0.0–0.5)
Eosinophils Relative: 1 %
HCT: 24.9 % — ABNORMAL LOW (ref 36.0–46.0)
Hemoglobin: 7.7 g/dL — ABNORMAL LOW (ref 12.0–15.0)
Lymphocytes Relative: 7 %
Lymphs Abs: 0.4 10*3/uL — ABNORMAL LOW (ref 0.7–4.0)
MCH: 29.7 pg (ref 26.0–34.0)
MCHC: 30.9 g/dL (ref 30.0–36.0)
MCV: 96.1 fL (ref 80.0–100.0)
Monocytes Absolute: 0.3 10*3/uL (ref 0.1–1.0)
Monocytes Relative: 5 %
Neutro Abs: 5.4 10*3/uL (ref 1.7–7.7)
Neutrophils Relative %: 87 %
Platelets: 143 10*3/uL — ABNORMAL LOW (ref 150–400)
RBC: 2.59 MIL/uL — ABNORMAL LOW (ref 3.87–5.11)
RDW: 16.7 % — ABNORMAL HIGH (ref 11.5–15.5)
WBC: 6.2 10*3/uL (ref 4.0–10.5)
nRBC: 0.6 % — ABNORMAL HIGH (ref 0.0–0.2)
nRBC: 1 /100 WBC — ABNORMAL HIGH

## 2019-06-13 LAB — URINALYSIS, ROUTINE W REFLEX MICROSCOPIC
Bacteria, UA: NONE SEEN
Bilirubin Urine: NEGATIVE
Glucose, UA: NEGATIVE mg/dL
Ketones, ur: 5 mg/dL — AB
Leukocytes,Ua: NEGATIVE
Nitrite: NEGATIVE
Protein, ur: 100 mg/dL — AB
RBC / HPF: >50 RBC/hpf — ABNORMAL HIGH (ref 0–5)
Specific Gravity, Urine: 1.012 (ref 1.005–1.030)
pH: 5 (ref 5.0–8.0)

## 2019-06-13 LAB — LACTIC ACID, PLASMA
Lactic Acid, Venous: 1.6 mmol/L (ref 0.5–1.9)
Lactic Acid, Venous: 1.8 mmol/L (ref 0.5–1.9)

## 2019-06-13 LAB — MAGNESIUM: Magnesium: 1.9 mg/dL (ref 1.7–2.4)

## 2019-06-13 LAB — TSH: TSH: 0.695 u[IU]/mL (ref 0.350–4.500)

## 2019-06-13 MED ORDER — DEXTROSE-NACL 5-0.9 % IV SOLN
INTRAVENOUS | Status: AC
  Administered 2019-06-13 – 2019-06-14 (×2): via INTRAVENOUS

## 2019-06-13 MED ORDER — LACTATED RINGERS IV BOLUS
500.0000 mL | Freq: Once | INTRAVENOUS | Status: AC
  Administered 2019-06-13: 500 mL via INTRAVENOUS

## 2019-06-13 MED ORDER — POTASSIUM CHLORIDE 10 MEQ/100ML IV SOLN
10.0000 meq | Freq: Once | INTRAVENOUS | Status: AC
  Administered 2019-06-13: 22:00:00 10 meq via INTRAVENOUS
  Filled 2019-06-13: qty 100

## 2019-06-13 MED ORDER — HYDRALAZINE HCL 20 MG/ML IJ SOLN
5.0000 mg | Freq: Once | INTRAMUSCULAR | Status: AC
  Administered 2019-06-13: 23:00:00 5 mg via INTRAVENOUS
  Filled 2019-06-13: qty 1

## 2019-06-13 MED ORDER — LACTATED RINGERS IV BOLUS
500.0000 mL | Freq: Once | INTRAVENOUS | Status: AC
  Administered 2019-06-13: 18:00:00 500 mL via INTRAVENOUS

## 2019-06-13 MED ORDER — LACTATED RINGERS IV BOLUS
500.0000 mL | Freq: Once | INTRAVENOUS | Status: AC
  Administered 2019-06-13: 19:00:00 500 mL via INTRAVENOUS

## 2019-06-13 NOTE — ED Provider Notes (Signed)
[facility] EMERGENCY DEPARTMENT Provider Note   CSN: [id number] Arrival date & time: 06/13/19  1531     History   Chief Complaint Chief Complaint  Patient presents with  . Altered Mental Status    HPI Wendy [name] is a 85 y.o. female.     85 year old female with past medical history including uterine cancer, hypertension who presents with altered mental status.  Daughter reports that the patient has had a progressive decline with generalized weakness and decreased appetite, however over the past 1 to 2 weeks she has had a more precipitous decline.  She was evaluated on 8/18 and noted to be anemic. She has not yet been able to follow up with heme-onc but daughter notes that they have been working hard to get home health set up with equipment in their home.  Her generalized weakness became severe this morning and she was not able to lift herself up to stand from the commode.  Family was also concerned that she was mumbling and did not seem to be talking or responding to them like usual.  She has had urinary incontinence for the past week which is new for her.  She continues to have vaginal bleeding; daughter states that she changes her depends twice a day.  Patient herself denies any pain and has not had any cough, fever, vomiting, diarrhea, or breathing problems.  Daughter is concerned that she has not been wanting to take anything by mouth, clenching her teeth when they tried to give her water.  LEVEL 5 CAVEAT DUE TO AMS   The history is provided by a relative. The history is limited by the condition of the patient.  Altered Mental Status   Past Medical History:  Diagnosis Date  . History of radiation therapy 02/21/2018-03/31/2018   Uterus, pelvis1.8 Gy in 25 fractions for a total dose of 45 Gy     . Hypertension   . Thyroid disease     Patient Active Problem List   Diagnosis Date Noted  . Acute encephalopathy 06/13/2019  . Uterine carcinoma ([place])  01/31/2018  . Syncope 09/28/2013  . Altered mental status 09/26/2013  . Syncope and collapse 09/26/2013  . Hypothyroidism 09/26/2013  . Depression 09/26/2013  . HTN (hypertension) 09/26/2013    Past Surgical History:  Procedure Laterality Date  . CORONARY ANGIOPLASTY       OB History   No obstetric history on file.      Home Medications    Prior to Admission medications   Medication Sig Start Date End Date Taking? Authorizing Provider  aspirin EC 81 MG tablet Take 1 tablet (81 mg total) by mouth daily. 02/07/19   Brunetta Genera, MD  citalopram (CELEXA) 10 MG tablet Take 5 mg daily after supper by mouth.  08/24/13   [provider]  ENSURE (ENSURE) Take 237 mLs by mouth 2 (two) times daily between meals.    [provider]  hydrocortisone cream 1 % Apply to affected area 2 times daily 12/30/12   Palumbo, April, MD  losartan (COZAAR) 50 MG tablet Take 50 mg by mouth daily.    [provider]  megestrol (MEGACE) 40 MG tablet Take 1 tablet (40 mg total) by mouth daily. 02/07/19   Brunetta Genera, MD  SYNTHROID 75 MCG tablet TAKE 1 TABLET(75 MCG) BY MOUTH DAILY BEFORE BREAKFAST 05/26/19   Brunetta Genera, MD  triamcinolone (KENALOG) 0.025 % cream Apply 1 application 2 (two) times daily as needed  topically (rash).    [provider]  Vitamin D, Ergocalciferol, (DRISDOL) 50000 units CAPS capsule Take 50,000 Units every 7 (seven) days by mouth.    [provider]    Family History No family history on file.  Social History Social History   Tobacco Use  . Smoking status: Never Smoker  . Smokeless tobacco: Never Used  Substance Use Topics  . Alcohol use: No  . Drug use: No     Allergies   Patient has no known allergies.   Review of Systems Review of Systems  Unable to perform ROS: Mental status change     Physical Exam Updated Vital Signs BP (!) 213/102   Pulse 79   Temp 98.7 F (37.1 C) (Oral)   Resp (!)  21   SpO2 94%   Physical Exam Vitals signs and nursing note reviewed.  Constitutional:      Appearance: She is well-developed.     Comments: Frail, elderly, chronically ill appearing  HENT:     Head: Normocephalic and atraumatic.  Eyes:     Conjunctiva/sclera: Conjunctivae normal.     Pupils: Pupils are equal, round, and reactive to light.  Neck:     Musculoskeletal: Neck supple.  Cardiovascular:     Rate and Rhythm: Normal rate. Rhythm irregular.     Heart sounds: Normal heart sounds. No murmur.  Pulmonary:     Effort: Pulmonary effort is normal.     Breath sounds: Normal breath sounds.  Abdominal:     General: Bowel sounds are normal. There is no distension.     Palpations: Abdomen is soft.     Tenderness: There is no abdominal tenderness.  Musculoskeletal:        General: No swelling.     Right lower leg: No edema.     Left lower leg: No edema.  Skin:    General: Skin is warm and dry.  Neurological:     Mental Status: She is alert.     Comments: Oriented to person, occasional confusion, 4/5 strength BUE, 3/5 BLE; 1+ DTRS b/l LE; no clonus      ED Treatments / Results  Labs (all labs ordered are listed, but only abnormal results are displayed) Labs Reviewed  COMPREHENSIVE METABOLIC PANEL - Abnormal; Notable for the following components:      Result Value   Sodium 149 (*)    Potassium 3.0 (*)    BUN 26 (*)    Creatinine, Ser 1.10 (*)    Albumin 3.2 (*)    AST 65 (*)    Total Bilirubin 1.3 (*)    GFR calc non Af Amer 41 (*)    GFR calc Af Amer 48 (*)    All other components within normal limits  CBC WITH DIFFERENTIAL/PLATELET - Abnormal; Notable for the following components:   RBC 2.59 (*)    Hemoglobin 7.7 (*)    HCT 24.9 (*)    RDW 16.7 (*)    Platelets 143 (*)    nRBC 0.6 (*)    Lymphs Abs 0.4 (*)    nRBC 1 (*)    All other components within normal limits  URINALYSIS, ROUTINE W REFLEX MICROSCOPIC - Abnormal; Notable for the following components:    APPearance HAZY (*)    Hgb urine dipstick LARGE (*)    Ketones, ur 5 (*)    Protein, ur 100 (*)    RBC / HPF >50 (*)    All other components within normal limits  URINE CULTURE  SARS CORONAVIRUS 2 (TAT 6-12 HRS)  LACTIC ACID, PLASMA  LACTIC ACID, PLASMA  MAGNESIUM  TSH  TYPE AND SCREEN    EKG EKG Interpretation  Date/Time:  Tuesday June 13 2019 15:34:19 EDT Ventricular Rate:  82 PR Interval:    QRS Duration: 134 QT Interval:  455 QTC Calculation: 474 R Axis:   -32 Text Interpretation:  Sinus rhythm Supraventricular bigeminy Right bundle branch block Nonspecific T abnormalities, lateral leads rate faster, morphology similar to previous Confirmed by Theotis Burrow [phone]) on 06/13/2019 3:40:00 PM   Radiology Ct Head Wo Contrast  Result Date: 06/13/2019 CLINICAL DATA:  Altered level of consciousness, unexplained altered mental status and generalized weakness, nonverbal for 2 days EXAM: CT HEAD WITHOUT CONTRAST TECHNIQUE: Contiguous axial images were obtained from the base of the skull through the vertex without intravenous contrast. COMPARISON:  CT June 06, 2019 FINDINGS: Brain: Redemonstration of gliosis in the right cerebellar hemisphere as well as a remote lacune in the right corona radiata additional lacune is present in the right thalamus, likely remote but better seen on today's examination. No evidence of acute infarction, hemorrhage, hydrocephalus, extra-axial collection or mass lesion/mass effect. Symmetric prominence of the ventricles, cisterns and sulci compatible with parenchymal volume loss. Patchy areas of white matter hypoattenuation are most compatible with chronic microvascular angiopathy. Vascular: Atherosclerotic calcification of the carotid siphons and intradural vertebral arteries. Skull: No calvarial fracture or suspicious osseous lesion. No scalp swelling or hematoma. Sinuses/Orbits: Paranasal sinuses and mastoid air cells are predominantly clear. Included  orbital structures are unremarkable. Other: None. 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 If persisting clinical concern for acute infarction, MRI is more sensitive and specific. Chronic microvascular changes, volume loss and atherosclerosis. Electronically Signed   By: Lovena Le M.D.   On: 06/13/2019 17:00    Procedures Procedures (including critical care time)  Medications Ordered in ED Medications  potassium chloride 10 mEq in 100 mL IVPB (has no administration in time range)  lactated ringers bolus 500 mL (0 mLs Intravenous Stopped 06/13/19 1859)  lactated ringers bolus 500 mL (0 mLs Intravenous Stopped 06/13/19 1859)  lactated ringers bolus 500 mL (500 mLs Intravenous New Bag/Given 06/13/19 1902)     Initial Impression / Assessment and Plan / ED Course  I have reviewed the triage vital signs and the nursing notes.  Pertinent labs & imaging results that were available during my care of the patient were reviewed by me and considered in my medical decision making (see chart for details).        Pt non-toxic on exam, mild confusion, hypertensive, afebrile. Global weakness.  Work shows normal lactate sodium 149, potassium 3.0, BUN 26, creatinine 1.1, normal WBC count, hemoglobin 7.7 which is slightly decreased from previous near 8.  UA without evidence of infection, hematuria likely secondary to vaginal bleeding.  CT shows evidence of remote infarcts but no obvious acute findings to explain her symptoms.  I am concerned that she has continued to have confusion and her generalized weakness appears profound, family concerned about her ability to function at home given she cannot stand up with assistance.  Discussed admission with [facility], Dr. Hal Hope, and pt admitted for further care. Final Clinical Impressions(s) / ED Diagnoses   Final diagnoses:  Altered mental status,  unspecified altered mental status type  Generalized weakness    ED Discharge Orders    None  Quentin Shorey, Wenda Overland, MD 06/13/19 2125

## 2019-06-13 NOTE — Progress Notes (Signed)
Attempted to get patient Wendy [name],Wendy [name] ready for her MRI exam and she grabbed my arm and did not understand why she had to have a MRI. She does not want her jewelry removed or her telemetry leads. Patient keeps repeating herself and does not understand, she will not let me get her ready for the MRI. RN notified.

## 2019-06-13 NOTE — ED Notes (Signed)
Patient transported to CT 

## 2019-06-13 NOTE — Telephone Encounter (Signed)
Wendy [name] called again and said the EMS are there and [name] is opening her eyes and talking.  She is wondering if they should still bring her to the ER.  Advised her that she should still be evaluated.

## 2019-06-13 NOTE — Telephone Encounter (Signed)
[name] called and asked if Wendy [name] should be on iron supplements for her anemia and if she should have her appointment with [name] on 07/04/19 moved up.  Transferred her to Dr. Grier Mitts nurse.

## 2019-06-13 NOTE — Telephone Encounter (Signed)
Wendy [name] left a message saying the Wendy [name] is not speaking now, just making "mmm" noises, is very weak and seems to be in pain.  She is wondering what to do. Discussed with Dr. Sondra Come and advised Wendy [name] to bring her to the ER.  Wendy [name] said she is going to bring her to [facility] ER.  Also asked if they are ready for a hospice referral and Wendy [name] said that they are.

## 2019-06-13 NOTE — Telephone Encounter (Signed)
Patient's daughter called, left voice mail - stated mother was at hospital last week. Is still weak, her hgb and iron were down. Wondered if she should have appt with [name] or start back on iron. 12:45: Contacted daughter (Ms. [place]) to inform that per [name], patient can be scheduled for blood transfusion if she wants to come to [facility] and/or could take oral iron (OTC) if she wants to. Daughter answered phone - stating mother had changed since this morning and she wanted to know what to do. She states mother unresponsive, incontinent (for 2-3 days) - wearing depends. She said mother is making sounds, but not responding to her. She states mother had not taken fluids - clenching teeth when they try to get water or food in.  Information given to [name] - [name] advised that she should be brought to ED for evaluation. Informed daughter of MD advise and she verbalized understanding.

## 2019-06-13 NOTE — ED Triage Notes (Signed)
Patient arrived via EMS from home. Family called with concern after patient hadn't talked for 2 days. She is talking now and A&O to baseline according to family. History of cancer, currently under radiation treatment, full code. Patient reports no pain.

## 2019-06-13 NOTE — ED Notes (Signed)
Attempted In and Out x2 with 2 RN's and tech present. Unable to obtain urine. MD made aware. Will reattempt after next LR bolus.

## 2019-06-14 ENCOUNTER — Telehealth: Payer: Self-pay | Admitting: Oncology

## 2019-06-14 ENCOUNTER — Observation Stay (HOSPITAL_COMMUNITY): Payer: Medicare Other

## 2019-06-14 DIAGNOSIS — R531 Weakness: Secondary | ICD-10-CM

## 2019-06-14 DIAGNOSIS — I160 Hypertensive urgency: Secondary | ICD-10-CM | POA: Diagnosis not present

## 2019-06-14 DIAGNOSIS — G9340 Encephalopathy, unspecified: Secondary | ICD-10-CM | POA: Diagnosis not present

## 2019-06-14 DIAGNOSIS — E876 Hypokalemia: Secondary | ICD-10-CM | POA: Diagnosis present

## 2019-06-14 LAB — COMPREHENSIVE METABOLIC PANEL
ALT: 16 U/L (ref 0–44)
AST: 70 U/L — ABNORMAL HIGH (ref 15–41)
Albumin: 3.3 g/dL — ABNORMAL LOW (ref 3.5–5.0)
Alkaline Phosphatase: 109 U/L (ref 38–126)
Anion gap: 12 (ref 5–15)
BUN: 21 mg/dL (ref 8–23)
CO2: 24 mmol/L (ref 22–32)
Calcium: 10 mg/dL (ref 8.9–10.3)
Chloride: 109 mmol/L (ref 98–111)
Creatinine, Ser: 1.08 mg/dL — ABNORMAL HIGH (ref 0.44–1.00)
GFR calc Af Amer: 49 mL/min — ABNORMAL LOW (ref >60)
GFR calc non Af Amer: 42 mL/min — ABNORMAL LOW (ref >60)
Glucose, Bld: 127 mg/dL — ABNORMAL HIGH (ref 70–99)
Potassium: 2.5 mmol/L — CL (ref 3.5–5.1)
Sodium: 145 mmol/L (ref 135–145)
Total Bilirubin: 1.5 mg/dL — ABNORMAL HIGH (ref 0.3–1.2)
Total Protein: 7.2 g/dL (ref 6.5–8.1)

## 2019-06-14 LAB — URINE CULTURE

## 2019-06-14 LAB — CBC WITH DIFFERENTIAL/PLATELET
Abs Immature Granulocytes: 0.64 10*3/uL — ABNORMAL HIGH (ref 0.00–0.07)
Basophils Absolute: 0 10*3/uL (ref 0.0–0.1)
Basophils Relative: 1 %
Eosinophils Absolute: 0 10*3/uL (ref 0.0–0.5)
Eosinophils Relative: 0 %
HCT: 25.9 % — ABNORMAL LOW (ref 36.0–46.0)
Hemoglobin: 8.4 g/dL — ABNORMAL LOW (ref 12.0–15.0)
Immature Granulocytes: 10 %
Lymphocytes Relative: 8 %
Lymphs Abs: 0.5 10*3/uL — ABNORMAL LOW (ref 0.7–4.0)
MCH: 30.5 pg (ref 26.0–34.0)
MCHC: 32.4 g/dL (ref 30.0–36.0)
MCV: 94.2 fL (ref 80.0–100.0)
Monocytes Absolute: 0.4 10*3/uL (ref 0.1–1.0)
Monocytes Relative: 6 %
Neutro Abs: 5.1 10*3/uL (ref 1.7–7.7)
Neutrophils Relative %: 75 %
Platelets: 143 10*3/uL — ABNORMAL LOW (ref 150–400)
RBC: 2.75 MIL/uL — ABNORMAL LOW (ref 3.87–5.11)
RDW: 16.9 % — ABNORMAL HIGH (ref 11.5–15.5)
WBC: 6.7 10*3/uL (ref 4.0–10.5)
nRBC: 0.7 % — ABNORMAL HIGH (ref 0.0–0.2)

## 2019-06-14 LAB — SARS CORONAVIRUS 2 (TAT 6-24 HRS): SARS Coronavirus 2: NEGATIVE

## 2019-06-14 LAB — FOLATE: Folate: 10 ng/mL (ref >5.9)

## 2019-06-14 LAB — RETICULOCYTES
Immature Retic Fract: 32.7 % — ABNORMAL HIGH (ref 2.3–15.9)
RBC.: 2.75 MIL/uL — ABNORMAL LOW (ref 3.87–5.11)
Retic Count, Absolute: 77.3 10*3/uL (ref 19.0–186.0)
Retic Ct Pct: 2.8 % (ref 0.4–3.1)

## 2019-06-14 LAB — FERRITIN: Ferritin: 2252 ng/mL — ABNORMAL HIGH (ref 11–307)

## 2019-06-14 LAB — IRON AND TIBC
Iron: 69 ug/dL (ref 28–170)
Saturation Ratios: 30 % (ref 10.4–31.8)
TIBC: 234 ug/dL — ABNORMAL LOW (ref 250–450)
UIBC: 165 ug/dL

## 2019-06-14 LAB — VITAMIN B12: Vitamin B-12: 329 pg/mL (ref 180–914)

## 2019-06-14 MED ORDER — ADULT MULTIVITAMIN W/MINERALS CH
1.0000 | ORAL_TABLET | Freq: Every day | ORAL | Status: DC
  Filled 2019-06-14 (×2): qty 1

## 2019-06-14 MED ORDER — LEVOTHYROXINE SODIUM 100 MCG/5ML IV SOLN
37.5000 ug | Freq: Every day | INTRAVENOUS | Status: DC
  Administered 2019-06-14: 10:00:00 37.5 ug via INTRAVENOUS
  Filled 2019-06-14: qty 5

## 2019-06-14 MED ORDER — ENSURE ENLIVE PO LIQD: 237.0000 mL | Freq: Two times a day (BID) | ORAL | Status: DC

## 2019-06-14 MED ORDER — ONDANSETRON HCL 4 MG PO TABS: 4.0000 mg | ORAL_TABLET | Freq: Four times a day (QID) | ORAL | Status: DC | PRN

## 2019-06-14 MED ORDER — LEVOTHYROXINE SODIUM 75 MCG PO TABS
75.0000 ug | ORAL_TABLET | Freq: Every day | ORAL | Status: DC
  Filled 2019-06-14: qty 1

## 2019-06-14 MED ORDER — HYDRALAZINE HCL 20 MG/ML IJ SOLN
10.0000 mg | INTRAMUSCULAR | Status: DC | PRN
  Administered 2019-06-14 (×2): 10 mg via INTRAVENOUS
  Filled 2019-06-14 (×2): qty 1

## 2019-06-14 MED ORDER — ACETAMINOPHEN 650 MG RE SUPP: 650.0000 mg | Freq: Four times a day (QID) | RECTAL | Status: DC | PRN

## 2019-06-14 MED ORDER — POTASSIUM CHLORIDE 10 MEQ/100ML IV SOLN
10.0000 meq | INTRAVENOUS | Status: AC
  Administered 2019-06-14 (×6): 10 meq via INTRAVENOUS
  Filled 2019-06-14 (×6): qty 100

## 2019-06-14 MED ORDER — ONDANSETRON HCL 4 MG/2ML IJ SOLN: 4.0000 mg | Freq: Four times a day (QID) | INTRAMUSCULAR | Status: DC | PRN

## 2019-06-14 MED ORDER — LOSARTAN POTASSIUM 50 MG PO TABS
50.0000 mg | ORAL_TABLET | Freq: Every day | ORAL | Status: DC
  Filled 2019-06-14 (×3): qty 1

## 2019-06-14 MED ORDER — MEGESTROL ACETATE 40 MG PO TABS
40.0000 mg | ORAL_TABLET | Freq: Every day | ORAL | Status: DC
  Filled 2019-06-14 (×6): qty 1

## 2019-06-14 MED ORDER — ACETAMINOPHEN 325 MG PO TABS: 650.0000 mg | ORAL_TABLET | Freq: Four times a day (QID) | ORAL | Status: DC | PRN

## 2019-06-14 NOTE — Progress Notes (Addendum)
Pt arrived to the unit alert and oriented to self only. Pt was unable to answer any admission questions. Pt was agitated and aggressive. Pt was hitting and kicking and had to be placed in safety mitts in order to stop her from pulling at her lines and monitor. Pt was unable to be  educated on the phone and call bed system. Pt refused labs and would not allow her vitals to be taking. Pt's skin was checked by the required two RNs. Pt has no skin issues to report at this time. Will continue to monitor.

## 2019-06-14 NOTE — H&P (Signed)
History and Physical    Wendy [name] [id number] DOB: October 24, 1916 DOA: 06/13/2019  PCP: Lajean Manes, MD  Patient coming from: Home.  History obtained from patient's daughter as patient is confused.  Chief Complaint: Confusion and weakness.  HPI: Wendy [name] is a 85 y.o. female with history of recurrent endometrial carcinoma as received radiation therapy with ongoing vaginal bleeding, hypertension, CAD, hypothyroidism was brought to the ER because of increasing weakness and confusion.  Per patient's daughter patient usually is ambulatory and alert awake but over the last 1 week patient has been gradually declining with poor oral intake increasing confusion and less ambulatory.  Patient has to be carried out of her room to be brought to the ER.  Has not had any nausea vomiting or diarrhea.  Patient has been ongoing vaginal bleed from endometrial carcinoma and Dr. Merrilyn Puma was planning brachytherapy.  ED Course: In the ER patient is oriented to her name moves all extremity appears weak.  CT head is unremarkable.  Lab work show hemoglobin of 7.7 which is gradually decline over the last 3 to 4 months.  Sodium was 149 creatinine 1.1.  EKG shows normal sinus rhythm CT head unremarkable.  Chest x-ray does not show any acute.  COVID-19 test is pending.  Patient was started on IV fluids and admitted for generalized weakness acute encephalopathy likely from dehydration and progressive anemia.  Review of Systems: As per HPI, rest all negative.   Past Medical History:  Diagnosis Date  . History of radiation therapy 02/21/2018-03/31/2018   Uterus, pelvis1.8 Gy in 25 fractions for a total dose of 45 Gy     . Hypertension   . Thyroid disease     Past Surgical History:  Procedure Laterality Date  . CORONARY ANGIOPLASTY       reports that she has never smoked. She has never used smokeless tobacco. She reports that she does not drink alcohol or use drugs.  No Known Allergies  Family  History  Problem Relation Age of Onset  . Hypertension Daughter   . Hypertension Son     Prior to Admission medications   Medication Sig Start Date End Date Taking? Authorizing Provider  aspirin EC 81 MG tablet Take 1 tablet (81 mg total) by mouth daily. 02/07/19  Yes Brunetta Genera, MD  citalopram (CELEXA) 10 MG tablet Take 5 mg daily after supper by mouth.  08/24/13  Yes [provider]  ENSURE (ENSURE) Take 237 mLs by mouth 2 (two) times daily between meals.   Yes [provider]  losartan (COZAAR) 50 MG tablet Take 50 mg by mouth daily.   Yes [provider]  megestrol (MEGACE) 40 MG tablet Take 1 tablet (40 mg total) by mouth daily. 02/07/19  Yes Brunetta Genera, MD  SYNTHROID 75 MCG tablet TAKE 1 TABLET(75 MCG) BY MOUTH DAILY BEFORE BREAKFAST Patient taking differently: Take 75 mcg by mouth daily before breakfast.  05/26/19  Yes Brunetta Genera, MD  triamcinolone (KENALOG) 0.025 % cream Apply 1 application 2 (two) times daily as needed topically (rash).   Yes [provider]  Vitamin D, Ergocalciferol, (DRISDOL) 50000 units CAPS capsule Take 50,000 Units every 7 (seven) days by mouth.   Yes [provider]    Physical Exam: Constitutional: Moderately built and nourished. Vitals:   06/13/19 2100 06/13/19 2115 06/13/19 2206 06/13/19 2215  BP: (!) 199/94 (!) 212/91 (!) 217/89 (!) 212/87  Pulse:    69  Resp: 20 19 (!)  21 17  Temp:      TempSrc:      SpO2:    96%   Eyes: Anicteric no pallor. ENMT: No discharge from the ears eyes nose or mouth. Neck: No mass felt.  No neck rigidity. Respiratory: No rhonchi or crepitations. Cardiovascular: S1-S2 heard. Abdomen: Soft nontender bowel sounds present. Musculoskeletal: No edema. Skin: No rash. Neurologic: Patient is oriented to her name but appears weak moves all extremities. Psychiatric: Oriented to her name.   Labs on Admission: I have personally reviewed following labs and  imaging studies  CBC: Recent Labs  Lab 06/13/19 1620  WBC 6.2  NEUTROABS 5.4  HGB 7.7*  HCT 24.9*  MCV 96.1  PLT [id number]*   Basic Metabolic Panel: Recent Labs  Lab 06/13/19 1620 06/13/19 2022  NA 149*  --   K 3.0*  --   CL 111  --   CO2 25  --   GLUCOSE 93  --   BUN 26*  --   CREATININE 1.10*  --   CALCIUM 10.3  --   MG  --  1.9   GFR: Estimated Creatinine Clearance: 21.5 mL/min (A) (by C-G formula based on SCr of 1.1 mg/dL (H)). Liver Function Tests: Recent Labs  Lab 06/13/19 1620  AST 65*  ALT 14  ALKPHOS 100  BILITOT 1.3*  PROT 7.2  ALBUMIN 3.2*   No results for input(s): LIPASE, AMYLASE in the last 168 hours. No results for input(s): AMMONIA in the last 168 hours. Coagulation Profile: No results for input(s): INR, PROTIME in the last 168 hours. Cardiac Enzymes: No results for input(s): CKTOTAL, CKMB, CKMBINDEX, TROPONINI in the last 168 hours. BNP (last 3 results) No results for input(s): PROBNP in the last 8760 hours. HbA1C: No results for input(s): HGBA1C in the last 72 hours. CBG: No results for input(s): GLUCAP in the last 168 hours. Lipid Profile: No results for input(s): CHOL, HDL, LDLCALC, TRIG, CHOLHDL, LDLDIRECT in the last 72 hours. Thyroid Function Tests: Recent Labs    06/13/19 1954  TSH 0.695   Anemia Panel: No results for input(s): VITAMINB12, FOLATE, FERRITIN, TIBC, IRON, RETICCTPCT in the last 72 hours. Urine analysis:    Component Value Date/Time   COLORURINE YELLOW 06/13/2019 1601   APPEARANCEUR HAZY (A) 06/13/2019 1601   LABSPEC 1.012 06/13/2019 1601   PHURINE 5.0 06/13/2019 1601   GLUCOSEU NEGATIVE 06/13/2019 1601   HGBUR LARGE (A) 06/13/2019 1601   BILIRUBINUR NEGATIVE 06/13/2019 1601   KETONESUR 5 (A) 06/13/2019 1601   PROTEINUR 100 (A) 06/13/2019 1601   UROBILINOGEN 0.2 09/28/2013 1136   NITRITE NEGATIVE 06/13/2019 1601   LEUKOCYTESUR NEGATIVE 06/13/2019 1601   Sepsis Labs: @LABRCNTIP (procalcitonin:4,lacticidven:4)  )No results found for this or any previous visit (from the past 240 hour(s)).   Radiological Exams on Admission: Ct Head Wo Contrast  Result Date: 06/13/2019 CLINICAL DATA:  Altered level of consciousness, unexplained altered mental status and generalized weakness, nonverbal for 2 days EXAM: CT HEAD WITHOUT CONTRAST TECHNIQUE: Contiguous axial images were obtained from the base of the skull through the vertex without intravenous contrast. COMPARISON:  CT June 06, 2019 FINDINGS: Brain: Redemonstration of gliosis in the right cerebellar hemisphere as well as a remote lacune in the right corona radiata additional lacune is present in the right thalamus, likely remote but better seen on today's examination. No evidence of acute infarction, hemorrhage, hydrocephalus, extra-axial collection or mass lesion/mass effect. Symmetric prominence of the ventricles, cisterns and sulci compatible with parenchymal volume loss.  Patchy areas of white matter hypoattenuation are most compatible with chronic microvascular angiopathy. Vascular: Atherosclerotic calcification of the carotid siphons and intradural vertebral arteries. Skull: No calvarial fracture or suspicious osseous lesion. No scalp swelling or hematoma. Sinuses/Orbits: Paranasal sinuses and mastoid air cells are predominantly clear. Included orbital structures are unremarkable. Other: None. 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 If persisting clinical concern for acute infarction, MRI is more sensitive and specific. Chronic microvascular changes, volume loss and atherosclerosis. Electronically Signed   By: Lovena Le M.D.   On: 06/13/2019 17:00    EKG: Independently reviewed.  Sinus rhythm.  Assessment/Plan Principal Problem:   Acute encephalopathy Active Problems:   Hypothyroidism   Uterine carcinoma ([facility])   Generalized weakness    Thrombocytopenia ([facility])   Normochromic normocytic anemia   Hypertensive urgency    1. Acute encephalopathy and generalized weakness likely from dehydration poor oral intake and progressive anemia with recurrent endometrial carcinoma -will gently hydrate follow metabolic panel-CBC and may need transfusion.  MRI brain was ordered.  She is pending.  COVID-19 test is pending. 2. Hypertensive urgency -Per daughter patient has not taken her antihypertensive medication yesterday.  We will keep patient on PRN IV hydralazine for now. 3. Progressive worsening anemia likely from ongoing bleed from uterine cancer.  Follow CBC.  May need transfusion if hemoglobin is less than 7.  Check anemia panel. 4. Hypothyroidism we will keep patient on IV Synthroid until patient can swallow. 5. Thrombocytopenia -follow CBC. 6. CAD status post PCI denies any chest pain.  Presently n.p.o.  COVID-19 test is pending.   DVT prophylaxis: SCDs because of ongoing vaginal bleed. Code Status: Full code confirmed with patient's daughter. Family Communication: Patient's daughter. Disposition Plan: To be determined. Consults called: None. Admission status: Observation.   Rise Patience MD [facility] Pager [phone].  If 7PM-7AM, please contact night-coverage [email or web address] Password [email or web address]  06/14/2019, 12:00 AM

## 2019-06-14 NOTE — Progress Notes (Signed)
Initial Nutrition Assessment  DOCUMENTATION CODES:   Not applicable  INTERVENTION:   -MVI with minerals daily -Magic cup TID with meals, each supplement provides 290 kcal and 9 grams of protein -Ensure Enlive po BID, each supplement provides 350 kcal and 20 grams of protein  NUTRITION DIAGNOSIS:   Increased nutrient needs related to chronic illness(endometrial cancer) as evidenced by estimated needs.  GOAL:   Patient will meet greater than or equal to 90% of their needs  MONITOR:   PO intake, Supplement acceptance, Labs, Weight trends, Skin, I & O's  REASON FOR ASSESSMENT:   Low Braden    ASSESSMENT:   Wendy [name] is a 85 y.o. female with history of recurrent endometrial carcinoma as received radiation therapy with ongoing vaginal bleeding, hypertension, CAD, hypothyroidism was brought to the ER because of increasing weakness and confusion.  Per patient's daughter patient usually is ambulatory and alert awake but over the last 1 week patient has been gradually declining with poor oral intake increasing confusion and less ambulatory.  Patient has to be carried out of her room to be brought to the ER.  Has not had any nausea vomiting or diarrhea.  Patient has been ongoing vaginal bleed from endometrial carcinoma and Dr. Merrilyn Puma was planning brachytherapy.  Pt admitted with acute encephalopathy and generalized weakness.   Reviewed I/O's: +1.6 L x 24 hours   UOP: 300 ml x 24 hours  Spoke with pt at bedside, who was very pleasant, however, unable to provide any meaningful history. She continuously repeated to this RD "I'm going great. Thank you for stopping by to see me".  She does not think that she ate anything today; noted pt was advanced to regular diet just prior to RD visit.   Reviewed wt hx; noted pt has experienced a 10.5% wt loss over the past 6 months, which is significant for time frame. Pt is at high risk for malnutrition, however, RD unable to identify at this  time (suspected generalized muscle depletion may be related to advanced age). Pt would greatly benefit from addition of nutritional supplements.    Medications reviewed and include dextrose 5%-0.9% sodium chloride infusion @ 75 ml/hr.   Labs reviewed: K: 2.5 (on IV supplementation).   NUTRITION - FOCUSED PHYSICAL EXAM:    Most Recent Value  Orbital Region  No depletion  Upper Arm Region  No depletion  Thoracic and Lumbar Region  No depletion  Buccal Region  No depletion  Temple Region  Mild depletion  Clavicle Bone Region  No depletion  Clavicle and Acromion Bone Region  No depletion  Scapular Bone Region  No depletion  Dorsal Hand  Mild depletion  Patellar Region  Mild depletion  Anterior Thigh Region  Mild depletion  Posterior Calf Region  Mild depletion  Edema (RD Assessment)  None  Hair  Reviewed  Eyes  Reviewed  Mouth  Reviewed  Skin  Reviewed  Nails  Reviewed       Diet Order:   Diet Order            Diet regular Room service appropriate? No; Fluid consistency: Thin  Diet effective now              EDUCATION NEEDS:   Not appropriate for education at this time  Skin:  Skin Assessment: Reviewed RN Assessment  Last BM:  Unknown  Height:   Ht Readings from Last 1 Encounters:  06/14/19 5\' 2"  (1.575 m)    Weight:   Wt  Readings from Last 1 Encounters:  06/14/19 54.7 kg    Ideal Body Weight:  50 kg  BMI:  Body mass index is 22.06 kg/m.  Estimated Nutritional Needs:   Kcal:  1450-1650  Protein:  60-75 grams  Fluid:  > 1.4 L    Wendy [name] A. Jimmye Norman, RD, LDN, [place] Registered Dietitian II Certified Diabetes Care and Education Specialist Pager: [phone] After hours Pager: [phone]

## 2019-06-14 NOTE — ED Notes (Signed)
ED TO INPATIENT HANDOFF REPORT  ED Nurse Name and Phone #: Annie Main [phone]  S Name/Age/Gender Wendy [name] 85 y.o. female Room/Bed: 036C/036C  Code Status   Code Status: Prior  Home/SNF/Other Home Patient oriented to: self Is this baseline? Yes   Triage Complete: Triage complete  Chief Complaint 85yo, AMS  Triage Note Patient arrived via EMS from home. Family called with concern after patient hadn't talked for 2 days. She is talking now and A&O to baseline according to family. History of cancer, currently under radiation treatment, full code. Patient reports no pain.    Allergies No Known Allergies  Level of Care/Admitting Diagnosis ED Disposition    ED Disposition Condition Comment   Admit  Hospital Area: [facility] [[id number]]  Level of Care: Telemetry Medical [104]  I expect the patient will be discharged within 24 hours: No (not a candidate for 5C-Observation unit)  Covid Evaluation: Asymptomatic Screening Protocol (No Symptoms)  Diagnosis: Acute encephalopathy [id number]  Admitting Physician: Rise Patience [phone]  Attending Physician: Rise Patience [email or web address]  PT Class (Do Not Modify): Observation [104]  PT Acc Code (Do Not Modify): Observation [10022]       B Medical/Surgery History Past Medical History:  Diagnosis Date  . History of radiation therapy 02/21/2018-03/31/2018   Uterus, pelvis1.8 Gy in 25 fractions for a total dose of 45 Gy     . Hypertension   . Thyroid disease    Past Surgical History:  Procedure Laterality Date  . CORONARY ANGIOPLASTY       A IV Location/Drains/Wounds Patient Lines/Drains/Airways Status   Active Line/Drains/Airways    Name:   Placement date:   Placement time:   Site:   Days:   Peripheral IV 06/06/19 Left;Medial Forearm   06/06/19    2244    Forearm   8   Peripheral IV 06/06/19 Left Antecubital   06/06/19    2245    Antecubital   8   Peripheral IV 06/13/19 Right Antecubital   06/13/19    -     Antecubital   1   External Urinary Catheter   06/13/19    1857    -   1          Intake/Output Last 24 hours  Intake/Output Summary (Last 24 hours) at 06/14/2019 0032 Last data filed at 06/13/2019 2137 Gross per 24 hour  Intake 1500 ml  Output -  Net 1500 ml    Labs/Imaging Results for orders placed or performed during the hospital encounter of 06/13/19 (from the past 48 hour(s))  Urinalysis, Routine w reflex microscopic     Status: Abnormal   Collection Time: 06/13/19  4:01 PM  Result Value Ref Range   Color, Urine YELLOW YELLOW   APPearance HAZY (A) CLEAR   Specific Gravity, Urine 1.012 1.005 - 1.030   pH 5.0 5.0 - 8.0   Glucose, UA NEGATIVE NEGATIVE mg/dL   Hgb urine dipstick LARGE (A) NEGATIVE   Bilirubin Urine NEGATIVE NEGATIVE   Ketones, ur 5 (A) NEGATIVE mg/dL   Protein, ur 100 (A) NEGATIVE mg/dL   Nitrite NEGATIVE NEGATIVE   Leukocytes,Ua NEGATIVE NEGATIVE   RBC / HPF >50 (H) 0 - 5 RBC/hpf   WBC, UA 0-5 0 - 5 WBC/hpf   Bacteria, UA NONE SEEN NONE SEEN   Hyaline Casts, UA PRESENT     Comment: Performed at [facility], [address] [place], [place] [address]  Comprehensive metabolic panel  Status: Abnormal   Collection Time: 06/13/19  4:20 PM  Result Value Ref Range   Sodium 149 (H) 135 - 145 mmol/L   Potassium 3.0 (L) 3.5 - 5.1 mmol/L   Chloride 111 98 - 111 mmol/L   CO2 25 22 - 32 mmol/L   Glucose, Bld 93 70 - 99 mg/dL   BUN 26 (H) 8 - 23 mg/dL   Creatinine, Ser 1.10 (H) 0.44 - 1.00 mg/dL   Calcium 10.3 8.9 - 10.3 mg/dL   Total Protein 7.2 6.5 - 8.1 g/dL   Albumin 3.2 (L) 3.5 - 5.0 g/dL   AST 65 (H) 15 - 41 U/L   ALT 14 0 - 44 U/L   Alkaline Phosphatase 100 38 - 126 U/L   Total Bilirubin 1.3 (H) 0.3 - 1.2 mg/dL   GFR calc non Af Amer 41 (L) >60 mL/min   GFR calc Af Amer 48 (L) >60 mL/min   Anion gap 13 5 - 15    Comment: Performed at [facility] [address], [place], [place] [address]  CBC with Differential     Status: Abnormal    Collection Time: 06/13/19  4:20 PM  Result Value Ref Range   WBC 6.2 4.0 - 10.5 K/uL   RBC 2.59 (L) 3.87 - 5.11 MIL/uL   Hemoglobin 7.7 (L) 12.0 - 15.0 g/dL   HCT 24.9 (L) 36.0 - 46.0 %   MCV 96.1 80.0 - 100.0 fL   MCH 29.7 26.0 - 34.0 pg   MCHC 30.9 30.0 - 36.0 g/dL   RDW 16.7 (H) 11.5 - 15.5 %   Platelets 143 (L) 150 - 400 K/uL   nRBC 0.6 (H) 0.0 - 0.2 %   Neutrophils Relative % 87 %   Neutro Abs 5.4 1.7 - 7.7 K/uL   Lymphocytes Relative 7 %   Lymphs Abs 0.4 (L) 0.7 - 4.0 K/uL   Monocytes Relative 5 %   Monocytes Absolute 0.3 0.1 - 1.0 K/uL   Eosinophils Relative 1 %   Eosinophils Absolute 0.1 0.0 - 0.5 K/uL   Basophils Relative 0 %   Basophils Absolute 0.0 0.0 - 0.1 K/uL   nRBC 1 (H) 0 /100 WBC   Abs Immature Granulocytes 0.00 0.00 - 0.07 K/uL   Polychromasia PRESENT    Ovalocytes PRESENT     Comment: Performed at [facility], [place] [address], [place], [place] [address]  Type and screen [place]     Status: None (Preliminary result)   Collection Time: 06/13/19  4:20 PM  Result Value Ref Range   ABO/RH(D) O POS    Antibody Screen POS    Sample Expiration 06/16/2019,2359    Antibody Identification WARM AUTOANTIBODY    DAT, IgG POS    Antibody ID,T [place]      WARM AUTOANTIBODY Performed at [facility], [place] [address] [place], [place] [address]    Unit Number [id number]    Blood Component Type RED CELLS,LR    Unit division 00    Status of Unit ALLOCATED    Transfusion Status OK TO TRANSFUSE    Crossmatch Result COMPATIBLE    Unit Number [id number]    Blood Component Type RED CELLS,LR    Unit division 00    Status of Unit ALLOCATED    Transfusion Status OK TO TRANSFUSE    Crossmatch Result COMPATIBLE   Lactic acid, plasma     Status: None   Collection Time: 06/13/19  4:22 PM  Result Value Ref Range   Lactic Acid, Venous 1.6 0.5 - 1.9 mmol/L    Comment: Performed at [facility] [address] [place], [place]  [address]  Lactic acid, plasma     Status: None   Collection Time: 06/13/19  7:54 PM  Result Value Ref Range   Lactic Acid, Venous 1.8 0.5 - 1.9 mmol/L    Comment: Performed at [facility] [address], [place], [place] [address]  TSH     Status: None   Collection Time: 06/13/19  7:54 PM  Result Value Ref Range   TSH 0.695 0.350 - 4.500 uIU/mL    Comment: Performed by a 3rd Generation assay with a functional sensitivity of <=0.01 uIU/mL. Performed at [facility], [place] [address], [place], [place] [address]   Magnesium     Status: None   Collection Time: 06/13/19  8:22 PM  Result Value Ref Range   Magnesium 1.9 1.7 - 2.4 mg/dL    Comment: Performed at [facility], [place] [address], [place], [place] [address]   Ct Head Wo Contrast  Result Date: 06/13/2019 CLINICAL DATA:  Altered level of consciousness, unexplained altered mental status and generalized weakness, nonverbal for 2 days EXAM: CT HEAD WITHOUT CONTRAST TECHNIQUE: Contiguous axial images were obtained from the base of the skull through the vertex without intravenous contrast. COMPARISON:  CT June 06, 2019 FINDINGS: Brain: Redemonstration of gliosis in the right cerebellar hemisphere as well as a remote lacune in the right corona radiata additional lacune is present in the right thalamus, likely remote but better seen on today's examination. No evidence of acute infarction, hemorrhage, hydrocephalus, extra-axial collection or mass lesion/mass effect. Symmetric prominence of the ventricles, cisterns and sulci compatible with parenchymal volume loss. Patchy areas of white matter hypoattenuation are most compatible with chronic microvascular angiopathy. Vascular: Atherosclerotic calcification of the carotid siphons and intradural vertebral arteries. Skull: No calvarial fracture or suspicious osseous lesion. No scalp swelling or hematoma. Sinuses/Orbits: Paranasal sinuses and mastoid air cells are predominantly clear.  Included orbital structures are unremarkable. Other: None. 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 If persisting clinical concern for acute infarction, MRI is more sensitive and specific. Chronic microvascular changes, volume loss and atherosclerosis. Electronically Signed   By: Lovena Le M.D.   On: 06/13/2019 17:00    Pending Labs Unresulted Labs (From admission, onward)    Start     Ordered   06/13/19 2052  SARS CORONAVIRUS 2 (TAT 6-12 HRS) Nasal Swab Aptima Multi Swab  (Asymptomatic/Tier 2 Patients Labs)  Once,   STAT    Question Answer Comment  Is this test for diagnosis or screening Screening   Symptomatic for COVID-19 as defined by CDC No   Hospitalized for COVID-19 No   Admitted to ICU for COVID-19 No   Previously tested for COVID-19 No   Resident in a congregate (group) care setting No   Employed in healthcare setting No   Pregnant No      06/13/19 2052   06/13/19 1601  Urine culture  ONCE - STAT,   STAT     06/13/19 1601   Signed and Held  Comprehensive metabolic panel  Tomorrow morning,   R     Signed and Held   Signed and Held  CBC WITH DIFFERENTIAL  Tomorrow morning,   R  Signed and Held          Vitals/Pain Today's Vitals   06/13/19 2100 06/13/19 2115 06/13/19 2206 06/13/19 2215  BP: (!) 199/94 (!) 212/91 (!) 217/89 (!) 212/87  Pulse:    69  Resp: 20 19 (!) 21 17  Temp:      TempSrc:      SpO2:    96%  PainSc:        Isolation Precautions No active isolations  Medications Medications  dextrose 5 %-0.9 % sodium chloride infusion ( Intravenous New Bag/Given 06/13/19 2136)  hydrALAZINE (APRESOLINE) injection 10 mg (has no administration in time range)  lactated ringers bolus 500 mL (0 mLs Intravenous Stopped 06/13/19 1859)  lactated ringers bolus 500 mL (0 mLs Intravenous Stopped 06/13/19 1859)  lactated ringers bolus 500 mL (0 mLs  Intravenous Stopped 06/13/19 2137)  potassium chloride 10 mEq in 100 mL IVPB (0 mEq Intravenous Stopped 06/13/19 2256)  hydrALAZINE (APRESOLINE) injection 5 mg (5 mg Intravenous Given 06/13/19 2255)    Mobility non-ambulatory High fall risk   Focused Assessments Neuro Assessment Handoff:  Swallow screen pass? Deferred     Last date known well: 06/11/19   Neuro Assessment:   Neuro Checks:      Last Documented NIHSS Modified Score:   Has TPA been given? No If patient is a Neuro Trauma and patient is going to OR before floor call report to [place] nurse: [phone] or [phone]     R Recommendations: See Admitting Provider Note  Report given to:   Additional Notes:

## 2019-06-14 NOTE — Progress Notes (Signed)
CRITICAL VALUE ALERT  Critical Value:  Potassium 2.5  Date & Time Notied:  06/14/2019 6:26am   Provider Notified: Baltazar Najjar, NP  Orders Received/Actions taken:

## 2019-06-14 NOTE — Telephone Encounter (Signed)
Wendy [name] called and said Wendy [name] has been admitted to [facility].  She said she was concerned about the attempts to start a foley catheter that were unsuccessful and that she was not able to go with her mother for the CT scan or to her hospital room.  Assured her that her mother is receiving excellent care and that she can go and visit her today.  Wendy [name] said that she is getting ready and then will go to the hospital.  Advised her to call if she has any questions.

## 2019-06-14 NOTE — Progress Notes (Addendum)
Patient admitted after midnight but care began in the ER prior to midnight. 85yo hx recurrent endometrial carcinoma received palliative radiation therapy with intermittent vaginal bleeding, htn, cad, hypothyroidism admitted acute encephalopathy of unclear etiology. Usually ambulatory and interactive able to make wants and needs known. Decline over last week with decreased oral intake and continued vaginal bleeding. Ct head with remote infarcts and no acute intracranial abnormality. No s/sx infection, some metabolic abnormalities related to dehydration. Was agitated and combative during night.   Improved this am. Has received IV fluids and voided. Requesting food, cooperative, attempts to follow commands and answer questions. Some work Field seismologist noted.   1. Acute encephalopathy and generalized weakness likely from dehydration poor oral intake and progressive anemia with recurrent endometrial carcinoma -continue IV fluids. Replete potassium as level 2.5. sodium level trending down.   follow metabolic panel-.  MRI brain was ordered.  COVID-19 test is negative 2. Hypertensive urgency -Per daughter patient has not taken her antihypertensive medication yesterday. Will resume home anti-hypertensive meds.   We will keep patient on PRN IV hydralazine. 3. Progressive worsening anemia likely from ongoing bleed from uterine cancer. HG 8.4.  Follow CBC.  May need transfusion if hemoglobin is less than 7.  4. Hypothyroidism we will keep patient on Synthroid. TSH low end of normal.  5. Thrombocytopenia -follow CBC. 6. CAD status post PCI denies any chest pain.     Santiago Glad m. Black, NP

## 2019-06-15 ENCOUNTER — Inpatient Hospital Stay (HOSPITAL_COMMUNITY): Payer: Medicare Other

## 2019-06-15 DIAGNOSIS — G92 Toxic encephalopathy: Secondary | ICD-10-CM | POA: Diagnosis present

## 2019-06-15 DIAGNOSIS — R531 Weakness: Secondary | ICD-10-CM | POA: Diagnosis present

## 2019-06-15 DIAGNOSIS — Z8542 Personal history of malignant neoplasm of other parts of uterus: Secondary | ICD-10-CM | POA: Diagnosis not present

## 2019-06-15 DIAGNOSIS — I10 Essential (primary) hypertension: Secondary | ICD-10-CM | POA: Diagnosis present

## 2019-06-15 DIAGNOSIS — G9340 Encephalopathy, unspecified: Secondary | ICD-10-CM | POA: Diagnosis not present

## 2019-06-15 DIAGNOSIS — Z7982 Long term (current) use of aspirin: Secondary | ICD-10-CM | POA: Diagnosis not present

## 2019-06-15 DIAGNOSIS — R630 Anorexia: Secondary | ICD-10-CM | POA: Diagnosis present

## 2019-06-15 DIAGNOSIS — Z79890 Hormone replacement therapy (postmenopausal): Secondary | ICD-10-CM | POA: Diagnosis not present

## 2019-06-15 DIAGNOSIS — Z515 Encounter for palliative care: Secondary | ICD-10-CM | POA: Diagnosis present

## 2019-06-15 DIAGNOSIS — R32 Unspecified urinary incontinence: Secondary | ICD-10-CM | POA: Diagnosis present

## 2019-06-15 DIAGNOSIS — R0609 Other forms of dyspnea: Secondary | ICD-10-CM | POA: Diagnosis not present

## 2019-06-15 DIAGNOSIS — Z681 Body mass index (BMI) 19 or less, adult: Secondary | ICD-10-CM | POA: Diagnosis not present

## 2019-06-15 DIAGNOSIS — F0390 Unspecified dementia without behavioral disturbance: Secondary | ICD-10-CM | POA: Diagnosis present

## 2019-06-15 DIAGNOSIS — E039 Hypothyroidism, unspecified: Secondary | ICD-10-CM | POA: Diagnosis present

## 2019-06-15 DIAGNOSIS — Z923 Personal history of irradiation: Secondary | ICD-10-CM | POA: Diagnosis not present

## 2019-06-15 DIAGNOSIS — Z66 Do not resuscitate: Secondary | ICD-10-CM | POA: Diagnosis present

## 2019-06-15 DIAGNOSIS — R627 Adult failure to thrive: Secondary | ICD-10-CM | POA: Diagnosis present

## 2019-06-15 DIAGNOSIS — E876 Hypokalemia: Secondary | ICD-10-CM | POA: Diagnosis not present

## 2019-06-15 DIAGNOSIS — Z9861 Coronary angioplasty status: Secondary | ICD-10-CM | POA: Diagnosis not present

## 2019-06-15 DIAGNOSIS — D630 Anemia in neoplastic disease: Secondary | ICD-10-CM | POA: Diagnosis present

## 2019-06-15 DIAGNOSIS — Z20828 Contact with and (suspected) exposure to other viral communicable diseases: Secondary | ICD-10-CM | POA: Diagnosis present

## 2019-06-15 DIAGNOSIS — I2510 Atherosclerotic heart disease of native coronary artery without angina pectoris: Secondary | ICD-10-CM | POA: Diagnosis present

## 2019-06-15 DIAGNOSIS — Z79899 Other long term (current) drug therapy: Secondary | ICD-10-CM | POA: Diagnosis not present

## 2019-06-15 DIAGNOSIS — I160 Hypertensive urgency: Secondary | ICD-10-CM | POA: Diagnosis present

## 2019-06-15 DIAGNOSIS — E860 Dehydration: Secondary | ICD-10-CM | POA: Diagnosis present

## 2019-06-15 DIAGNOSIS — C541 Malignant neoplasm of endometrium: Secondary | ICD-10-CM | POA: Diagnosis present

## 2019-06-15 DIAGNOSIS — D696 Thrombocytopenia, unspecified: Secondary | ICD-10-CM | POA: Diagnosis present

## 2019-06-15 LAB — COMPREHENSIVE METABOLIC PANEL
ALT: 16 U/L (ref 0–44)
AST: 66 U/L — ABNORMAL HIGH (ref 15–41)
Albumin: 3.1 g/dL — ABNORMAL LOW (ref 3.5–5.0)
Alkaline Phosphatase: 103 U/L (ref 38–126)
Anion gap: 12 (ref 5–15)
BUN: 15 mg/dL (ref 8–23)
CO2: 23 mmol/L (ref 22–32)
Calcium: 10.2 mg/dL (ref 8.9–10.3)
Chloride: 112 mmol/L — ABNORMAL HIGH (ref 98–111)
Creatinine, Ser: 0.86 mg/dL (ref 0.44–1.00)
GFR calc Af Amer: >60 mL/min (ref >60)
GFR calc non Af Amer: 55 mL/min — ABNORMAL LOW (ref >60)
Glucose, Bld: 109 mg/dL — ABNORMAL HIGH (ref 70–99)
Potassium: 3 mmol/L — ABNORMAL LOW (ref 3.5–5.1)
Sodium: 147 mmol/L — ABNORMAL HIGH (ref 135–145)
Total Bilirubin: 2 mg/dL — ABNORMAL HIGH (ref 0.3–1.2)
Total Protein: 7.2 g/dL (ref 6.5–8.1)

## 2019-06-15 LAB — CBC
HCT: 23.4 % — ABNORMAL LOW (ref 36.0–46.0)
Hemoglobin: 7.5 g/dL — ABNORMAL LOW (ref 12.0–15.0)
MCH: 30 pg (ref 26.0–34.0)
MCHC: 32.1 g/dL (ref 30.0–36.0)
MCV: 93.6 fL (ref 80.0–100.0)
Platelets: 102 10*3/uL — ABNORMAL LOW (ref 150–400)
RBC: 2.5 MIL/uL — ABNORMAL LOW (ref 3.87–5.11)
RDW: 17.3 % — ABNORMAL HIGH (ref 11.5–15.5)
WBC: 6.7 10*3/uL (ref 4.0–10.5)
nRBC: 1.2 % — ABNORMAL HIGH (ref 0.0–0.2)

## 2019-06-15 MED ORDER — SODIUM CHLORIDE 0.9% FLUSH: 10.0000 mL | INTRAVENOUS | Status: DC | PRN

## 2019-06-15 MED ORDER — POTASSIUM CHLORIDE 10 MEQ/100ML IV SOLN
10.0000 meq | INTRAVENOUS | Status: AC
  Administered 2019-06-15 (×4): 10 meq via INTRAVENOUS
  Filled 2019-06-15 (×3): qty 100

## 2019-06-15 MED ORDER — POTASSIUM CHLORIDE 10 MEQ/100ML IV SOLN
10.0000 meq | INTRAVENOUS | Status: AC
  Filled 2019-06-15: qty 100

## 2019-06-15 MED ORDER — POTASSIUM CHLORIDE IN NACL 20-0.9 MEQ/L-% IV SOLN
INTRAVENOUS | Status: DC
  Administered 2019-06-15 – 2019-06-16 (×2): 50 mL/h via INTRAVENOUS
  Filled 2019-06-15 (×4): qty 1000

## 2019-06-15 MED ORDER — HYDRALAZINE HCL 20 MG/ML IJ SOLN
10.0000 mg | INTRAMUSCULAR | Status: DC | PRN
  Administered 2019-06-15 – 2019-06-17 (×6): 10 mg via INTRAVENOUS
  Filled 2019-06-15 (×6): qty 1

## 2019-06-15 MED ORDER — METOPROLOL TARTRATE 5 MG/5ML IV SOLN
5.0000 mg | Freq: Once | INTRAVENOUS | Status: AC
  Administered 2019-06-15: 21:00:00 5 mg via INTRAVENOUS
  Filled 2019-06-15: qty 5

## 2019-06-15 MED ORDER — METOPROLOL TARTRATE 12.5 MG HALF TABLET
12.5000 mg | ORAL_TABLET | Freq: Two times a day (BID) | ORAL | Status: DC
  Filled 2019-06-15: qty 1

## 2019-06-15 MED ORDER — LEVOTHYROXINE SODIUM 100 MCG/5ML IV SOLN
25.0000 ug | Freq: Every day | INTRAVENOUS | Status: DC
  Administered 2019-06-15 – 2019-06-19 (×5): 25 ug via INTRAVENOUS
  Filled 2019-06-15 (×5): qty 5

## 2019-06-15 NOTE — Progress Notes (Signed)
[facility] PROGRESS NOTE  Wendy [name] [id number] DOB: 19-Jul-1917 DOA: 06/13/2019 PCP: Lajean Manes, MD  Assessment/Plan:  #1. Acute encephalopathy and generalized weakness likely from dehydration in setting of poor oral intake and progressive anemia with recurrent endometrial carcinoma with intermittent vaginal bleeding. She continues to refuse anything by mouth. Potassium low.  -continue IV fluids, will need midline per IV team -replete potassium -palliative care consult -PT eval -attempt MRI -bmet in am -dietary supplements  #2. Hypertensive urgency. Unwilling to take po anti-hypertensive meds.  -prn hydralzaine -monitor closely  #3. Progressive worsening of anemia related to ongoing vaginal bleeding in setting recurrent uterine cancer.  -Hg pending -monitor closely -transfuse if less than 7.  #4. Hypothyroid. TSH low end of normal -synthroid IV  #5. Hypokalemia. Potassium 3.0 this am after 6 runs of K yesterday. Magnesium level 1.9 -replete with 4 runs K -recheck in am  #6. Failure to thrive. Spoke with daughter who indicates gradual decline over last several months just worsening this week.  -palliative care consult for goals of care    Code Status: limited Family Communication:  Disposition Plan: to be determined   Consultants:  Palliative care  Procedures:    Antibiotics:    HPI/Subjective: Lying in bed awake alert. Will no take po's. Resists exam and care  Objective: Vitals:   06/14/19 1534 06/14/19 2247  BP: (!) 160/89 (!) 176/107  Pulse:  97  Resp:  16  Temp:  (!) 97.4 F (36.3 C)  SpO2:  100%    Intake/Output Summary (Last 24 hours) at 06/15/2019 1141 Last data filed at 06/14/2019 1718 Gross per 24 hour  Intake 1642.75 ml  Output -  Net 1642.75 ml   Filed Weights   06/14/19 1131 06/15/19 0423  Weight: 54.7 kg 55.3 kg    Exam:   General:  Awake alert oriented to self only  Cardiovascular: rrr no mgr no LE  edema  Respiratory: normal effort BS clear bilaterally no wheeze  Abdomen: non-distended +BS no guarding or rebounding  Musculoskeletal: joints without swelling/erythem  Neuro: alert oriented to self only. Resists exam and treatment, unable to make wants and needs known.    Data Reviewed: Basic Metabolic Panel: Recent Labs  Lab 06/13/19 1620 06/13/19 2022 06/14/19 0526 06/15/19 0222  NA 149*  --  145 147*  K 3.0*  --  2.5* 3.0*  CL 111  --  109 112*  CO2 25  --  24 23  GLUCOSE 93  --  127* 109*  BUN 26*  --  21 15  CREATININE 1.10*  --  1.08* 0.86  CALCIUM 10.3  --  10.0 10.2  MG  --  1.9  --   --    Liver Function Tests: Recent Labs  Lab 06/13/19 1620 06/14/19 0526 06/15/19 0222  AST 65* 70* 66*  ALT 14 16 16   ALKPHOS 100 109 103  BILITOT 1.3* 1.5* 2.0*  PROT 7.2 7.2 7.2  ALBUMIN 3.2* 3.3* 3.1*   No results for input(s): LIPASE, AMYLASE in the last 168 hours. No results for input(s): AMMONIA in the last 168 hours. CBC: Recent Labs  Lab 06/13/19 1620 06/14/19 0526  WBC 6.2 6.7  NEUTROABS 5.4 5.1  HGB 7.7* 8.4*  HCT 24.9* 25.9*  MCV 96.1 94.2  PLT 143* 143*   Cardiac Enzymes: No results for input(s): CKTOTAL, CKMB, CKMBINDEX, TROPONINI in the last 168 hours. BNP (last 3 results) Recent Labs    06/06/19 2312  BNP 279.9*  ProBNP (last 3 results) No results for input(s): PROBNP in the last 8760 hours.  CBG: No results for input(s): GLUCAP in the last 168 hours.  Recent Results (from the past 240 hour(s))  Urine culture     Status: Abnormal   Collection Time: 06/13/19  7:25 PM   Specimen: Urine, Catheterized  Result Value Ref Range Status   Specimen Description URINE, CATHETERIZED  Final   Special Requests   Final    NONE Performed at [facility], [address], [place], [place] [address]    Culture MULTIPLE SPECIES PRESENT, SUGGEST RECOLLECTION (A)  Final   Report Status 06/14/2019 FINAL  Final  SARS CORONAVIRUS 2 (TAT 6-12 HRS)  Nasal Swab Aptima Multi Swab     Status: None   Collection Time: 06/13/19  8:52 PM   Specimen: Aptima Multi Swab; Nasal Swab  Result Value Ref Range Status   SARS Coronavirus 2 NEGATIVE NEGATIVE Final    Comment: (NOTE) SARS-CoV-2 target nucleic acids are NOT DETECTED. The SARS-CoV-2 RNA is generally detectable in upper and lower respiratory specimens during the acute phase of infection. Negative results do not preclude SARS-CoV-2 infection, do not rule out co-infections with other pathogens, and should not be used as the sole basis for treatment or other patient management decisions. Negative results must be combined with clinical observations, patient history, and epidemiological information. The expected result is Negative. Fact Sheet for Patients: SugarRoll.be Fact Sheet for Healthcare Providers: https://www.woods-mathews.com/ This test is not yet approved or cleared by the [place] FDA and  has been authorized for detection and/or diagnosis of SARS-CoV-2 by FDA under an Emergency Use Authorization (EUA). This EUA will remain  in effect (meaning this test can be used) for the duration of the COVID-19 declaration under Section 56 4(b)(1) of the Act, 21 U.S.C. section 360bbb-3(b)(1), unless the authorization is terminated or revoked sooner. Performed at [facility], [place] [address], [place], [place] [address]      Studies: Dg Abd 1 View  Result Date: 06/14/2019 CLINICAL DATA:  Decreased oral intake over the past week in a patient with a history of endometrial carcinoma. EXAM: ABDOMEN - 1 VIEW COMPARISON:  CT chest, abdomen and pelvis 12/26/2018. FINDINGS: The bowel gas pattern is normal. No radio-opaque calculi or other significant radiographic abnormality are seen. IMPRESSION: Negative exam. Electronically Signed   By: Inge Rise M.D.   On: 06/14/2019 15:19   Ct Head Wo Contrast  Result Date: 06/13/2019 CLINICAL DATA:   Altered level of consciousness, unexplained altered mental status and generalized weakness, nonverbal for 2 days EXAM: CT HEAD WITHOUT CONTRAST TECHNIQUE: Contiguous axial images were obtained from the base of the skull through the vertex without intravenous contrast. COMPARISON:  CT June 06, 2019 FINDINGS: Brain: Redemonstration of gliosis in the right cerebellar hemisphere as well as a remote lacune in the right corona radiata additional lacune is present in the right thalamus, likely remote but better seen on today's examination. No evidence of acute infarction, hemorrhage, hydrocephalus, extra-axial collection or mass lesion/mass effect. Symmetric prominence of the ventricles, cisterns and sulci compatible with parenchymal volume loss. Patchy areas of white matter hypoattenuation are most compatible with chronic microvascular angiopathy. Vascular: Atherosclerotic calcification of the carotid siphons and intradural vertebral arteries. Skull: No calvarial fracture or suspicious osseous lesion. No scalp swelling or hematoma. Sinuses/Orbits: Paranasal sinuses and mastoid air cells are predominantly clear. Included orbital structures are unremarkable. Other: None. IMPRESSION: Remote infarcts in the right cerebellum, right  corona radiata, likely with remote lacune in the right thalamus No acute intracranial abnormality. If persisting clinical concern for acute infarction, MRI is more sensitive and specific. Chronic microvascular changes, volume loss and atherosclerosis. Electronically Signed   By: Lovena Le M.D.   On: 06/13/2019 17:00    Scheduled Meds: . feeding supplement (ENSURE ENLIVE)  237 mL Oral BID BM  . levothyroxine  25 mcg Intravenous Daily  . losartan  50 mg Oral Daily  . megestrol  40 mg Oral Daily  . multivitamin with minerals  1 tablet Oral Daily   Continuous Infusions: . potassium chloride      Principal Problem:   Acute encephalopathy Active Problems:   Generalized weakness    Hypertensive urgency   Hypokalemia   Hypothyroidism   Normochromic normocytic anemia   Uterine carcinoma (HCC)   Thrombocytopenia (HCC)    Time spent: 5 minutes    [place] NP  [facility]  If 7PM-7AM, please contact night-coverage at [email or web address], password [facility] 06/15/2019, 11:41 AM  LOS: 0 days

## 2019-06-15 NOTE — Progress Notes (Signed)
Patients HR now ranging between 110-116. Eliseo Squires, MD notified, will continue to monitor.

## 2019-06-15 NOTE — Evaluation (Signed)
Physical Therapy Evaluation Patient Details Name: Wendy [name] MRN: [id number] DOB: February 07, 1917 Today's Date: 06/15/2019   History of Present Illness  Pt is a 85 y/o female admitted secondary to acute ecephalopathy. Pt also with uterine cancer and vaginal bleeding. CT negative fr acute infarct. PMH includes HTN and CAD.   Clinical Impression  Pt admitted secondary to problem above with deficits below. Difficult to obtain home and PLOF information given cognitive deficits. Pt also with mumbled speech. Pt requiring total A for bed mobility. Poor sitting balance noted, and pt with posterior lean requiring max A to maintain balance. Unsure of caregiver support at home and feel pt will require increased assist at d/c. Will continue to follow acutely to maximize functional mobility independence and safety.     Follow Up Recommendations SNF;Supervision/Assistance - 24 hour    Equipment Recommendations  Wheelchair (measurements PT);Wheelchair cushion (measurements PT);Hospital bed;Other (comment)(hoyer lift, hoyer lift pad)    Recommendations for Other Services       Precautions / Restrictions Precautions Precautions: Fall Restrictions Weight Bearing Restrictions: No      Mobility  Bed Mobility Overal bed mobility: Needs Assistance Bed Mobility: Supine to Sit;Sit to Supine     Supine to sit: Total assist Sit to supine: Total assist   General bed mobility comments: Pt requiring total A to perform bed mobility tasks. Once sitting, pt with heavy posterior lean requiring max A to maintain balance. Unsafe to attempt further mobility with +1 assist, so returned to supine with total A.   Transfers                    Ambulation/Gait                Stairs            Wheelchair Mobility    Modified Rankin (Stroke Patients Only)       Balance Overall balance assessment: Needs assistance Sitting-balance support: No upper extremity supported;Feet  supported Sitting balance-Leahy Scale: Zero Sitting balance - Comments: max A to maintain secondary to posterior lean.                                      Pertinent Vitals/Pain Pain Assessment: Faces Faces Pain Scale: No hurt    Home Living Family/patient expects to be discharged to:: Private residence Living Arrangements: Alone               Additional Comments: Per pt, she lives alone. Was not able to answer further questions about home. Per notes, looks like she has caregiver a thome.     Prior Function           Comments: Unsure of PLOF. Per notes, pt looks as if she was able to perform transfers with assist, however, had gotten too weak to perform right before admission.      Hand Dominance        Extremity/Trunk Assessment   Upper Extremity Assessment Upper Extremity Assessment: Generalized weakness    Lower Extremity Assessment Lower Extremity Assessment: Generalized weakness    Cervical / Trunk Assessment Cervical / Trunk Assessment: Kyphotic  Communication   Communication: Expressive difficulties(mumbled speech)  Cognition Arousal/Alertness: Awake/alert Behavior During Therapy: Flat affect Overall Cognitive Status: No family/caregiver present to determine baseline cognitive functioning  General Comments: Pt with mumbled speech and difficult to understand. Pt unable to answer questions about home and orientation questions.       General Comments      Exercises     Assessment/Plan    PT Assessment Patient needs continued PT services  PT Problem List Decreased strength;Decreased balance;Decreased mobility;Decreased cognition;Decreased knowledge of use of DME;Decreased safety awareness;Decreased knowledge of precautions       PT Treatment Interventions DME instruction;Gait training;Functional mobility training;Therapeutic activities;Therapeutic exercise;Balance  training;Patient/family education;Cognitive remediation    PT Goals (Current goals can be found in the Care Plan section)  Acute Rehab PT Goals PT Goal Formulation: Patient unable to participate in goal setting Time For Goal Achievement: 06/29/19 Potential to Achieve Goals: Fair    Frequency Min 2X/week   Barriers to discharge Other (comment) unsure of caregiver support     Co-evaluation               AM-PAC PT "6 Clicks" Mobility  Outcome Measure Help needed turning from your back to your side while in a flat bed without using bedrails?: Total Help needed moving from lying on your back to sitting on the side of a flat bed without using bedrails?: Total Help needed moving to and from a bed to a chair (including a wheelchair)?: Total Help needed standing up from a chair using your arms (e.g., wheelchair or bedside chair)?: Total Help needed to walk in hospital room?: Total Help needed climbing 3-5 steps with a railing? : Total 6 Click Score: 6    End of Session   Activity Tolerance: Patient tolerated treatment well Patient left: with call bell/phone within reach;in bed;with bed alarm set Nurse Communication: Mobility status PT Visit Diagnosis: Muscle weakness (generalized) (M62.81);Difficulty in walking, not elsewhere classified (R26.2)    Time: [id number] PT Time Calculation (min) (ACUTE ONLY): 16 min   Charges:   PT Evaluation $PT Eval Moderate Complexity: Newborn, PT, DPT  Acute Rehabilitation Services  Pager: [phone] Office: [phone]   Rudean Hitt 06/15/2019, 2:50 PM

## 2019-06-15 NOTE — Progress Notes (Signed)
VAST consulted to place PIV for fluids and potassium. Two VAS RN's attempted placing a PIV sticking the patient a total of 4 times, unsuccessfully.  Contacted pt's RN, Delle Reining advised to contact physician for alternate plan of care.

## 2019-06-16 ENCOUNTER — Inpatient Hospital Stay (HOSPITAL_COMMUNITY): Payer: Medicare Other

## 2019-06-16 ENCOUNTER — Telehealth: Payer: Self-pay | Admitting: Oncology

## 2019-06-16 DIAGNOSIS — Z515 Encounter for palliative care: Secondary | ICD-10-CM

## 2019-06-16 DIAGNOSIS — R627 Adult failure to thrive: Secondary | ICD-10-CM

## 2019-06-16 LAB — BASIC METABOLIC PANEL
Anion gap: 15 (ref 5–15)
Anion gap: 12 (ref 5–15)
BUN: 19 mg/dL (ref 8–23)
BUN: 26 mg/dL — ABNORMAL HIGH (ref 8–23)
CO2: 18 mmol/L — ABNORMAL LOW (ref 22–32)
CO2: 18 mmol/L — ABNORMAL LOW (ref 22–32)
Calcium: 10.2 mg/dL (ref 8.9–10.3)
Calcium: 10.2 mg/dL (ref 8.9–10.3)
Chloride: 114 mmol/L — ABNORMAL HIGH (ref 98–111)
Chloride: 116 mmol/L — ABNORMAL HIGH (ref 98–111)
Creatinine, Ser: 0.92 mg/dL (ref 0.44–1.00)
Creatinine, Ser: 1.16 mg/dL — ABNORMAL HIGH (ref 0.44–1.00)
GFR calc Af Amer: 59 mL/min — ABNORMAL LOW (ref >60)
GFR calc Af Amer: 45 mL/min — ABNORMAL LOW (ref >60)
GFR calc non Af Amer: 51 mL/min — ABNORMAL LOW (ref >60)
GFR calc non Af Amer: 39 mL/min — ABNORMAL LOW (ref >60)
Glucose, Bld: 100 mg/dL — ABNORMAL HIGH (ref 70–99)
Glucose, Bld: 114 mg/dL — ABNORMAL HIGH (ref 70–99)
Potassium: 3.4 mmol/L — ABNORMAL LOW (ref 3.5–5.1)
Potassium: 3.3 mmol/L — ABNORMAL LOW (ref 3.5–5.1)
Sodium: 147 mmol/L — ABNORMAL HIGH (ref 135–145)
Sodium: 146 mmol/L — ABNORMAL HIGH (ref 135–145)

## 2019-06-16 LAB — CBC
HCT: 24.2 % — ABNORMAL LOW (ref 36.0–46.0)
Hemoglobin: 7.7 g/dL — ABNORMAL LOW (ref 12.0–15.0)
MCH: 30.1 pg (ref 26.0–34.0)
MCHC: 31.8 g/dL (ref 30.0–36.0)
MCV: 94.5 fL (ref 80.0–100.0)
Platelets: 94 10*3/uL — ABNORMAL LOW (ref 150–400)
RBC: 2.56 MIL/uL — ABNORMAL LOW (ref 3.87–5.11)
RDW: 18.3 % — ABNORMAL HIGH (ref 11.5–15.5)
WBC: 8.3 10*3/uL (ref 4.0–10.5)
nRBC: 1.9 % — ABNORMAL HIGH (ref 0.0–0.2)

## 2019-06-16 MED ORDER — METOPROLOL TARTRATE 5 MG/5ML IV SOLN
5.0000 mg | Freq: Four times a day (QID) | INTRAVENOUS | Status: DC
  Administered 2019-06-16 – 2019-06-19 (×12): 5 mg via INTRAVENOUS
  Filled 2019-06-16 (×11): qty 5

## 2019-06-16 NOTE — TOC Initial Note (Addendum)
Transition of Care [facility]) - Initial/Assessment Note    Patient Details  Name: Wendy [name] MRN: [id number] Date of Birth: 09-09-1917  [facility]) CM/SW Contact:    Sharin Mons, RN Phone Number: 06/16/2019, 4:15 PM  Clinical Narrative:     Admitted with acute encephalopathy, hx of recurrent endometrial carcinoma / radiation therapy , hypertension, CAD, hypothyroidism. From home with daughter. PTA pt had recent ED visit which home health services  were setup with [facility], however, pt presented back to hospital prior to [facility].  [name] (Daughter)      [phone]       Per PT's recommendations:SNF;Supervision/Assistance - 24 hour  Daughter states doesn't want mom to d/c to SNF  2/2 COVID , however,states she doesn't have the help needed to care for mom alone @ home. Palliative consult pending ... TOC team to f/u with transitional care needs....  Expected Discharge Plan: Skilled Nursing Facility Barriers to Discharge: Continued Medical Work up   Patient Goals and CMS Choice        Expected Discharge Plan and Services Expected Discharge Plan: [place]   Discharge Planning Services: CM Consult                                          Prior Living Arrangements/Services   Lives with:: Adult Children(Daughter [name]) Patient language and need for interpreter reviewed:: Yes Do you feel safe going back to the place where you live?: Yes      Need for Family Participation in Patient Care: Yes (Comment) Care giver support system in place?: Yes (comment)   Criminal Activity/Legal Involvement Pertinent to Current Situation/Hospitalization: No - Comment as needed  Activities of Daily Living      Permission Sought/Granted Permission sought to share information with : Case Manager Permission granted to share information with : Yes, Verbal Permission Granted  Share Information with NAME: [name]         Permission granted to share info w Contact Information: [phone]  Emotional Assessment       Orientation: : Oriented to Self, Oriented to Place, Oriented to  Time, Oriented to Situation Alcohol / Substance Use: Not Applicable Psych Involvement: No (comment)  Admission diagnosis:  Generalized weakness [R53.1] Altered mental status, unspecified altered mental status type [R41.82] Patient Active Problem List   Diagnosis Date Noted  . Hypokalemia 06/14/2019  . Acute encephalopathy 06/13/2019  . Generalized weakness 06/13/2019  . Thrombocytopenia ([place]) 06/13/2019  . Normochromic normocytic anemia 06/13/2019  . Hypertensive urgency 06/13/2019  . Uterine carcinoma ([place]) 01/31/2018  . Syncope 09/28/2013  . Altered mental status 09/26/2013  . Syncope and collapse 09/26/2013  . Hypothyroidism 09/26/2013  . Depression 09/26/2013  . HTN (hypertension) 09/26/2013   PCP:  Lajean Manes, MD Pharmacy:   [facility] [place], [place] [address] [place] [id number] [place] [address] Phone: [phone] Fax: [phone]     Social Determinants of Health (SDOH) Interventions    Readmission Risk Interventions No flowsheet data found.

## 2019-06-16 NOTE — Progress Notes (Signed)
Chaplain responded to spiritual consult. Pt was asleep, very difficult to rouse.  RN said prayer could be supportive as pt could hear, though she has not been responsive.  Chaplain offered words of comfort, encouragement and a brief, simple prayer.  Pt briefly opened her eyes, but did not respond to any questions. Chaplain called daughter, left message about contact.   Please contact our department for further care as needed or requested.  Wendy [name], [place]      06/16/19 2000  Clinical Encounter Type  Visited With Family;Patient  Visit Type Initial;Patient actively dying;Spiritual support  Referral From Nurse  Consult/Referral To Chaplain  Spiritual Encounters  Spiritual Needs Prayer  Stress Factors  Patient Stress Factors Exhausted  Family Stress Factors Family relationships

## 2019-06-16 NOTE — Telephone Encounter (Signed)
[name] called and asked what Dr. Clabe Seal thoughts are about hospice. She would like to discuss if he had any treatment plans for [facility].  Advised her that he is out of the office today but will be notified on Monday morning and that we will call her back.

## 2019-06-16 NOTE — Progress Notes (Addendum)
PROGRESS NOTE  Wendy [name] [id number] DOB: Jan 11, 1917 DOA: 06/13/2019 PCP: Lajean Manes, MD  Brief History   Wendy [name] is a 85 y.o. female who continues to refuse oral intake.  Will need continues IVF and K replacement due to poor PO intake.  Patient also needs a palliative care consult for [place].  Mouth care.  PT Eval in place. Palliative care has spoken with the patient's daughter who wishes to discuss the patient with her family. She plans to get back in touch with palliative care on Monday regarding goals of care for her mother.  The patient is has toxic metabolic encephalopathy likely due to some combination of her advanced age, her hypertensive urgency and her dehydration.   Consultants  . Palliative care  Procedures  . None  Antibiotics   Anti-infectives (From admission, onward)   None    .   Subjective  The patient is lying quietly. She is not verbally responsive to me.   Objective   Vitals:  Vitals:   06/16/19 0755 06/16/19 1305  BP: (!) 166/93 (!) 162/94  Pulse:  94  Resp:  (!) 21  Temp:  97.6 F (36.4 C)  SpO2:  97%    Exam:  Constitutional:  . The patient is elderly, weak, and frail. She is non-verbal with me. No acute distress. Respiratory:  . No increased work of breathing. . No wheezes, rales, or rhonchi. . No tactile fremitus. Cardiovascular:  . Regular rate and rhythm . No murmurs, ectopy, or gallups. . No lateral PMI. No thrills. Abdomen:  . Abdomen is soft, non-tender, non-distended . No hernias, masses, or organomegaly . Normoactive bowel sounds. Musculoskeletal:  . No cyanosis, clubbing, or edema Skin:  . No rashes, lesions, ulcers . palpation of skin: no induration or nodules Neurologic:  . Pt is unable to participate with exam. Psychiatric:  . Pt is non-verbal. Unable to participate with exam.  I have personally reviewed the following:   Today's Data  . BMP, CBC .  Scheduled Meds: . feeding supplement  (ENSURE ENLIVE)  237 mL Oral BID BM  . levothyroxine  25 mcg Intravenous Daily  . losartan  50 mg Oral Daily  . megestrol  40 mg Oral Daily  . metoprolol tartrate  12.5 mg Oral BID  . multivitamin with minerals  1 tablet Oral Daily   Continuous Infusions: . 0.9 % NaCl with KCl 20 mEq / L 50 mL/hr (06/16/19 1234)    Principal Problem:   Acute encephalopathy Active Problems:   Hypothyroidism   Uterine carcinoma (HCC)   Generalized weakness   Thrombocytopenia (HCC)   Normochromic normocytic anemia   Hypertensive urgency   Hypokalemia   LOS: 1 day   A & P  Acute encephalopathy : Multifactorial.  Due to poor uncontrolled hypertension and dehydration due to poor PO intake on top of recurrent endometrial cancer with progressive anemia in a woman of advanced age. Clinically undetermined which factor is the most important causative factor.  Failure to thrive: Not taking PO.   Generalized weakness: Noted. Likely due to chronic uterine cancer and failure to thrive.  Hypertensive urgency: Improved on prn IV hydralazine. Pt is not taking PO meds.  Uterine carcinoma: Recurrent with ongoing vaginal bleeding and resultant chronic anemia.  Thrombocytopenia: Noted. Etiology unknown.  Hypokalemia: Due to nutritional deficiency. Monitor and supplement.  I have seen and examined this patient myself. I have spent 35 minutes in her evaluation and care.  DVT prophylaxis: SCD's Code Status:  Partial Family Communication: Palliative care has discussed the patient in detail with her daughter. Disposition Plan: tbd   Savino Whisenant, DO [facility] Direct contact: see [email or web address]  7PM-7AM contact night coverage as above 06/16/2019, 3:56 PM  LOS: 1 day

## 2019-06-16 NOTE — Consult Note (Signed)
Consultation Note Date: 06/16/2019   Patient Name: Wendy [name]  DOB: 1917-08-25  MRN: [id number]  Age / Sex: 85 y.o., female  PCP: Lajean Manes, MD Referring Physician: Karie Kirks, DO  Reason for Consultation:   Establishing GOC, emotional support   HPI/Patient Profile: 85 y.o. female   admitted on 06/13/2019  history of recurrent endometrial carcinoma as received radiation therapy with ongoing vaginal bleeding, hypertension, CAD, hypothyroidism was brought to the ER because of increasing weakness and confusion.  Per patient's daughter patient usually is ambulatory and alert awake but over the last 1 week patient has been gradually declining with poor oral intake increasing confusion and less ambulatory.  Patient has to be carried out of her room to be brought to the ER.  Has not had any nausea vomiting or diarrhea.  Patient has been ongoing vaginal bleed from endometrial carcinoma and Dr. Merrilyn Puma was planning brachytherapy.  Per daughter,  patient has has continued slow physical and functional decline over the past few months.  Currently patient remains lethargics, non communicative/unable to follow commands, with minimal po intake/sips and is with overall failure to thrive.   Family face treatment option decisions,advanced directive decisions and anticipatory care needs.  Clinical Assessment and Goals of Care:   This NP Wadie Lessen reviewed medical records, received report from team, assessed the patient and then spoke to her daughter   [name] to discuss diagnosis, prognosis, GOC, EOL wishes disposition and options.  Created space and opportunity for daughter to explore her thoughts and feeling regarding her mother's current medical situation.  She is "surprised and doesn't understand what is happening, she was fine one week ago"  We discussed concepts specific to human mortality,  limitations of medical interventions to prolong life when a body begins to fail to thrive.  Concept of Hospice and Palliative Care were discussed  A  discussion was had today regarding advanced directives.  Concepts specific to code status, artifical feeding and hydration, continued IV antibiotics and rehospitalization was had.  The difference between a aggressive medical intervention path  and a palliative comfort care path for this patient at this time was had.  Values and goals of care important to patient and family were attempted to be elicited.   Natural trajectory and expectations at EOL were discussed.  Questions and concerns addressed.   Family encouraged to call with questions or concerns.    PMT will continue to support holistically.     No documented HPOA, daughter is main support and Media planner.      SUMMARY OF RECOMMENDATIONS    Code Status/Advance Care Planning:  Partial as previously docuemnted   Palliative Prophylaxis:   Aspiration precautions, mouth care, pain assessment  Additional Recommendations (Limitations, Scope, Preferences):  Full scope treatment through the week-end.  Daughter needs time to process the situation and discuss with family decisions related to EOL  Will readdress GOCs on Monday with this NP   Psycho-social/Spiritual:   Desire for further Chaplaincy support:  Yes  Additional Recommendations:  Education on hospice benefit offered  Prognosis:   Days to weeks  Discharge Planning: to be determined     Primary Diagnoses: Present on Admission: . Acute encephalopathy . Hypothyroidism . Uterine carcinoma ([place]) . Thrombocytopenia ([place]) . Normochromic normocytic anemia . Hypertensive urgency . Hypokalemia   I have reviewed the medical record, interviewed the patient and family, and examined the patient. The following aspects are pertinent.  Past Medical History:  Diagnosis Date  . History of radiation therapy  02/21/2018-03/31/2018   Uterus, pelvis1.8 Gy in 25 fractions for a total dose of 45 Gy     . Hypertension   . Thyroid disease    Social History   Socioeconomic History  . Marital status: Widowed    Spouse name: Not on file  . Number of children: Not on file  . Years of education: Not on file  . Highest education level: Not on file  Occupational History  . Not on file  Social Needs  . Financial resource strain: Not on file  . Food insecurity    Worry: Not on file    Inability: Not on file  . Transportation needs    Medical: Not on file    Non-medical: Not on file  Tobacco Use  . Smoking status: Never Smoker  . Smokeless tobacco: Never Used  Substance and Sexual Activity  . Alcohol use: No  . Drug use: No  . Sexual activity: Never  Lifestyle  . Physical activity    Days per week: Not on file    Minutes per session: Not on file  . Stress: Not on file  Relationships  . Social Herbalist on phone: Not on file    Gets together: Not on file    Attends religious service: Not on file    Active member of club or organization: Not on file    Attends meetings of clubs or organizations: Not on file    Relationship status: Not on file  Other Topics Concern  . Not on file  Social History Narrative  . Not on file   Family History  Problem Relation Age of Onset  . Hypertension Daughter   . Hypertension Son    Scheduled Meds: . feeding supplement (ENSURE ENLIVE)  237 mL Oral BID BM  . levothyroxine  25 mcg Intravenous Daily  . losartan  50 mg Oral Daily  . megestrol  40 mg Oral Daily  . metoprolol tartrate  12.5 mg Oral BID  . multivitamin with minerals  1 tablet Oral Daily   Continuous Infusions: . 0.9 % NaCl with KCl 20 mEq / L 50 mL/hr at 06/15/19 1800   PRN Meds:.acetaminophen **OR** acetaminophen, hydrALAZINE, ondansetron **OR** ondansetron (ZOFRAN) IV, sodium chloride flush Medications Prior to Admission:  Prior to Admission medications   Medication Sig  Start Date End Date Taking? Authorizing Provider  aspirin EC 81 MG tablet Take 1 tablet (81 mg total) by mouth daily. 02/07/19  Yes Brunetta Genera, MD  citalopram (CELEXA) 10 MG tablet Take 5 mg daily after supper by mouth.  08/24/13  Yes [provider]  ENSURE (ENSURE) Take 237 mLs by mouth 2 (two) times daily between meals.   Yes [provider]  losartan (COZAAR) 50 MG tablet Take 50 mg by mouth daily.   Yes [provider]  megestrol (MEGACE) 40 MG tablet Take 1 tablet (40 mg total) by mouth daily. 02/07/19  Yes Brunetta Genera, MD  SYNTHROID 75 MCG tablet  TAKE 1 TABLET(75 MCG) BY MOUTH DAILY BEFORE BREAKFAST Patient taking differently: Take 75 mcg by mouth daily before breakfast.  05/26/19  Yes Brunetta Genera, MD  triamcinolone (KENALOG) 0.025 % cream Apply 1 application 2 (two) times daily as needed topically (rash).   Yes [provider]  Vitamin D, Ergocalciferol, (DRISDOL) 50000 units CAPS capsule Take 50,000 Units every 7 (seven) days by mouth.   Yes [provider]   No Known Allergies Review of Systems  Unable to perform ROS   Physical Exam  Vital Signs: BP (!) 149/79   Pulse (!) 105   Temp 97.6 F (36.4 C) (Axillary)   Resp (!) 22   Ht 5\' 2"  (1.575 m)   Wt 55.3 kg   SpO2 100%   BMI 22.31 kg/m  Pain Scale: Faces   Pain Score: 0-No pain   SpO2: SpO2: 100 % O2 Device:SpO2: 100 % O2 Flow Rate: .   IO: Intake/output summary:   Intake/Output Summary (Last 24 hours) at 06/16/2019 [id number] Last data filed at 06/16/2019 0325 Gross per 24 hour  Intake 380.15 ml  Output -  Net 380.15 ml    LBM: Last BM Date: (UTA) Baseline Weight: Weight: 54.7 kg(bedscale) Most recent weight: Weight: 55.3 kg     Palliative Assessment/Data:  30 % at best   Discussed with bedside RN and Dr Benny Lennert  Time In:1200 Time Out: 1310 Time Total: 70 minutes Greater than 50%  of this time was spent counseling and coordinating care  related to the above assessment and plan.  Signed by: Wadie Lessen, NP   Please contact Palliative Medicine Team phone at [phone] for questions and concerns.  For individual provider: See Shea Evans

## 2019-06-17 DIAGNOSIS — R627 Adult failure to thrive: Secondary | ICD-10-CM

## 2019-06-17 DIAGNOSIS — Z515 Encounter for palliative care: Secondary | ICD-10-CM

## 2019-06-17 LAB — BASIC METABOLIC PANEL
Anion gap: 12 (ref 5–15)
BUN: 31 mg/dL — ABNORMAL HIGH (ref 8–23)
CO2: 18 mmol/L — ABNORMAL LOW (ref 22–32)
Calcium: 10.2 mg/dL (ref 8.9–10.3)
Chloride: 119 mmol/L — ABNORMAL HIGH (ref 98–111)
Creatinine, Ser: 1.11 mg/dL — ABNORMAL HIGH (ref 0.44–1.00)
GFR calc Af Amer: 47 mL/min — ABNORMAL LOW (ref >60)
GFR calc non Af Amer: 41 mL/min — ABNORMAL LOW (ref >60)
Glucose, Bld: 110 mg/dL — ABNORMAL HIGH (ref 70–99)
Potassium: 3.7 mmol/L (ref 3.5–5.1)
Sodium: 149 mmol/L — ABNORMAL HIGH (ref 135–145)

## 2019-06-17 LAB — BPAM RBC
Blood Product Expiration Date: 202009242359
Blood Product Expiration Date: 202009252359
Unit Type and Rh: 5100
Unit Type and Rh: 5100

## 2019-06-17 LAB — TYPE AND SCREEN
ABO/RH(D): O POS
Antibody Screen: POSITIVE
DAT, IgG: POSITIVE
Unit division: 0
Unit division: 0

## 2019-06-17 MED ORDER — METOPROLOL TARTRATE 5 MG/5ML IV SOLN
5.0000 mg | INTRAVENOUS | Status: DC | PRN
  Administered 2019-06-17 – 2019-06-18 (×2): 5 mg via INTRAVENOUS
  Filled 2019-06-17 (×3): qty 5

## 2019-06-17 MED ORDER — DEXTROSE-NACL 5-0.45 % IV SOLN
INTRAVENOUS | Status: DC
  Administered 2019-06-17 – 2019-06-18 (×2): via INTRAVENOUS

## 2019-06-17 MED ORDER — FUROSEMIDE 10 MG/ML IJ SOLN
20.0000 mg | Freq: Every day | INTRAMUSCULAR | Status: DC
  Administered 2019-06-17 – 2019-06-18 (×2): 20 mg via INTRAVENOUS
  Filled 2019-06-17 (×2): qty 2

## 2019-06-17 MED ORDER — SODIUM CHLORIDE 0.45 % IV SOLN: INTRAVENOUS | Status: DC

## 2019-06-17 NOTE — Progress Notes (Signed)
   Vital Signs MEWS/VS Documentation      06/17/2019 0600 06/17/2019 0700 06/17/2019 0717 06/17/2019 0825   MEWS Score:  3  3  3  2    MEWS Score Color:  Yellow  Yellow  Yellow  Yellow   Resp:  -  -  -  (!) 23   BP:  (!) 158/98  -  (!) 158/106  (!) 155/93   Temp:  -  -  -  97.9 F (36.6 C)   O2 Device:  -  -  -  Room Air     Hydralazine given by previous shift.  No acute changes in patients assesment.  Will continue to monitor vs.      Rance Muir 06/17/2019,8:38 AM

## 2019-06-17 NOTE — Progress Notes (Addendum)
1944- Hydralazine 10 mg IVP given for 156/100. CN aware.  0001 B/p 157/101 HR 101, RR 33 scheduled dose of Metoprolol 5mg  IVP. "MEWPilot" implemented, not an acute change in pt status.  0148 Hydralazine 10mg  IVP given for B/P 163/89 HR 93, RR 25.  0618 Scheduled dose of Metoprolol given at this time, HR 101.  0717 B/P 158/106, Hydralazine 10 mg IVP given at this time. Report given to oncoming nurse at bedside.

## 2019-06-17 NOTE — Progress Notes (Signed)
PROGRESS NOTE  Wendy [name] [id number] DOB: 05-Sep-1917 DOA: 06/13/2019 PCP: Lajean Manes, MD  Brief History   Wendy [name] is a 85 y.o. female who continues to refuse oral intake.  Will need continues IVF and K replacement due to poor PO intake.  Patient also needs a palliative care consult for [place].  Mouth care.  PT Eval in place. Palliative care has spoken with the patient's daughter who wishes to discuss the patient with her family. She plans to get back in touch with palliative care on Monday regarding goals of care for her mother.  The patient is has toxic metabolic encephalopathy likely due to some combination of her advanced age, her hypertensive urgency and her dehydration.   Consultants  . Palliative care  Procedures  . None  Antibiotics   Anti-infectives (From admission, onward)   None      Subjective  The patient is lying quietly. She is not verbally responsive to me. She is taking no PO.  Objective   Vitals:  Vitals:   06/17/19 1018 06/17/19 1141  BP: (!) 154/75 (!) 158/81  Pulse:  88  Resp:  20  Temp:  97.6 F (36.4 C)  SpO2:      Exam:  Constitutional:  . The patient is elderly, weak, and frail. She is non-verbal with me. No acute distress. Respiratory:  . No increased work of breathing. . No wheezes, rales, or rhonchi. . No tactile fremitus. Cardiovascular:  . Regular rate and rhythm . No murmurs, ectopy, or gallups. . No lateral PMI. No thrills. Abdomen:  . Abdomen is soft, non-tender, non-distended . No hernias, masses, or organomegaly . Normoactive bowel sounds. Musculoskeletal:  . No cyanosis, clubbing, or edema Skin:  . No rashes, lesions, ulcers . palpation of skin: no induration or nodules Neurologic:  . Pt is unable to participate with exam. Psychiatric:  . Pt is non-verbal. Unable to participate with exam.  I have personally reviewed the following:   Today's Data  . BMP, CBC .  Scheduled Meds: . feeding  supplement (ENSURE ENLIVE)  237 mL Oral BID BM  . furosemide  20 mg Intravenous Daily  . levothyroxine  25 mcg Intravenous Daily  . losartan  50 mg Oral Daily  . megestrol  40 mg Oral Daily  . metoprolol tartrate  5 mg Intravenous Q6H  . metoprolol tartrate  12.5 mg Oral BID  . multivitamin with minerals  1 tablet Oral Daily   Continuous Infusions: . 0.9 % NaCl with KCl 20 mEq / L 50 mL/hr (06/16/19 1234)    Principal Problem:   Acute encephalopathy Active Problems:   Hypothyroidism   Uterine carcinoma (HCC)   Generalized weakness   Thrombocytopenia (HCC)   Normochromic normocytic anemia   Hypertensive urgency   Hypokalemia   Adult failure to thrive   Palliative care by specialist   LOS: 2 days   A & P  Acute encephalopathy : Multifactorial.  Due to poor uncontrolled hypertension and dehydration due to poor PO intake on top of recurrent endometrial cancer with progressive anemia in a woman of advanced age. Clinically undetermined which factor is the most important causative factor.  Failure to thrive: Not taking PO.   Advanced age: Noted.  Generalized weakness: Noted. Likely due to chronic uterine cancer and failure to thrive.  Hypertensive urgency: Improved on prn IV hydralazine. Pt is not taking PO meds.  Uterine carcinoma: Recurrent with ongoing vaginal bleeding and resultant chronic anemia.  Thrombocytopenia: Noted. Etiology  unknown.  Hypokalemia: Due to nutritional deficiency. Monitor and supplement.  I have seen and examined this patient myself. I have spent 30 minutes in her evaluation and care.  DVT prophylaxis: SCD's Code Status: Partial Family Communication: Palliative care has discussed the patient in detail with her daughter. Disposition Plan: tbd   Teaghan Melrose, DO [facility] Direct contact: see [email or web address]  7PM-7AM contact night coverage as above 06/16/2019, 3:56 PM  LOS: 1 day

## 2019-06-18 MED ORDER — DEXTROSE 5 % IV SOLN
INTRAVENOUS | Status: DC
  Administered 2019-06-18 – 2019-06-19 (×2): via INTRAVENOUS

## 2019-06-18 NOTE — Progress Notes (Signed)
PROGRESS NOTE  Wendy [name] [id number] DOB: 1917-09-14 DOA: 06/13/2019 PCP: Lajean Manes, MD  Brief History   Wendy [name] is a 85 y.o. female who continues to refuse oral intake.  Will need continues IVF and K replacement due to poor PO intake.  Patient also needs a palliative care consult for [place].  Mouth care.  PT Eval in place. Palliative care has spoken with the patient's daughter who wishes to discuss the patient with her family. She plans to get back in touch with palliative care on Monday regarding goals of care for her mother.  The patient is has toxic metabolic encephalopathy likely due to some combination of her advanced age, her hypertensive urgency and her dehydration.   06/18/2019: Patient seen.  No new changes.  Last chemistry was done on 06/17/2019.  Will change IV fluids to D5 water at the rate of 125 cc/h for the next 18 hours.  Renal panel and CBC in the morning.  Further management will depend on goal of care.  Guarded prognosis.  Consultants  . Palliative care  Procedures  . None  Antibiotics   Anti-infectives (From admission, onward)   None      Subjective  Patient is unable to participate in any history.  Objective   Vitals:  Vitals:   06/18/19 0432 06/18/19 0452  BP: (!) 140/113 (!) 163/108  Pulse: 85 85  Resp: (!) 25 (!) 24  Temp:    SpO2: 93% 94%    Exam:  Constitutional:  . Patient is now responding to verbal stimulation. Respiratory:  . Clear to auscultation. Cardiovascular:  . S1-S2. Abdomen:  . Abdomen is soft, non-tender, non-distended Musculoskeletal:  . No cyanosis, clubbing, or edema Neurologic:  . Pt is not responding to verbal stimulation.    I have personally reviewed the following:   Today's Data  . BMP, CBC .  Scheduled Meds: . feeding supplement (ENSURE ENLIVE)  237 mL Oral BID BM  . furosemide  20 mg Intravenous Daily  . levothyroxine  25 mcg Intravenous Daily  . losartan  50 mg Oral Daily  .  megestrol  40 mg Oral Daily  . metoprolol tartrate  5 mg Intravenous Q6H  . metoprolol tartrate  12.5 mg Oral BID  . multivitamin with minerals  1 tablet Oral Daily   Continuous Infusions: . dextrose 5 % and 0.45% NaCl 100 mL/hr at 06/18/19 0601    Principal Problem:   Acute encephalopathy Active Problems:   Hypothyroidism   Uterine carcinoma (HCC)   Generalized weakness   Thrombocytopenia (HCC)   Normochromic normocytic anemia   Hypertensive urgency   Hypokalemia   Adult failure to thrive   Palliative care by specialist   LOS: 3 days   A & P  Acute encephalopathy : Multifactorial.  Due to poor uncontrolled hypertension and dehydration due to poor PO intake on top of recurrent endometrial cancer with progressive anemia in a woman of advanced age. Clinically undetermined which factor is the most important causative factor. 06/18/2019: Continue to address goals of care.  Guarded prognosis.  Change IV fluids to D5 water at the rate of 125 cc an hour.  Failure to thrive: Not taking PO.   Hypertensive urgency: Improved on prn IV hydralazine. Pt is not taking PO meds.  Uterine carcinoma: Recurrent with ongoing vaginal bleeding and resultant chronic anemia.  Thrombocytopenia: Noted. Etiology unknown.  Hypokalemia:  Continue to monitor and replete.   Renal panel in the morning.  DVT prophylaxis: SCD's Code Status: Partial Family Communication: Palliative care has discussed the patient in detail with her daughter. Disposition Plan: tbd   Dana Allan MD [facility] Direct contact: see [email or web address]  7PM-7AM contact night coverage as above

## 2019-06-18 NOTE — Progress Notes (Signed)
MEWS Guidelines - (patients age 83 and over)  Red - At High Risk for Deterioration Yellow - At risk for Deterioration  1. Go to room and assess patient 2. Validate data. Is this patient's baseline? If data confirmed: 3. Is this an acute change? 4. Administer prn meds/treatments as ordered. 5. Note Sepsis score 6. Review goals of care 7. Notify Charge Nurse, RRT nurse and Provider. 8. Ask Provider to come to bedside.  9. Document patient condition/interventions/response. 10. Increase frequency of vital signs and focused assessments to at least q15 minutes x 4, then q30 minutes x2. - If stable, then q1h x3, then q4h x3 and then q8h or dept. routine. - If unstable, contact Provider & RRT nurse. Prepare for possible transfer. 11. Add entry in progress notes using the smart phrase ".MEWS". 1. Go to room and assess patient 2. Validate data. Is this patient's baseline? If data confirmed: 3. Is this an acute change? 4. Administer prn meds/treatments as ordered? 5. Note Sepsis score 6. Review goals of care 7. Notify Charge Nurse and Provider 8. Call RRT nurse as needed. 9. Document patient condition/interventions/response. 10. Increase frequency of vital signs and focused assessments to at least q2h x2. - If stable, then q4h x2 and then q8h or dept. routine. - If unstable, contact Provider & RRT nurse. Prepare for possible transfer. 11. Add entry in progress notes using the smart phrase ".MEWS".  Green - Likely stable [place] - Comfort Care Only  1. Continue routine/ordered monitoring.  2. Review goals of care. 1. Continue routine/ordered monitoring. 2. Review goals of care.     

## 2019-06-18 NOTE — Progress Notes (Signed)
   Vital Signs MEWS/VS Documentation      06/18/2019 0452 06/18/2019 0700 06/18/2019 0735 06/18/2019 1200   MEWS Score:  2  2  2  2    MEWS Score Color:  Yellow  Yellow  Yellow  Yellow   Resp:  (!) 24  -  -  (!) 24   Pulse:  85  -  -  74   BP:  (!) 163/108  -  -  (!) 163/60   Temp:  -  -  -  98.8 F (37.1 C)   Level of Consciousness:  -  -  Responds to Voice  -      No acute change in patients condition.       Wendy [name] 06/18/2019,1:00 PM

## 2019-06-18 NOTE — Progress Notes (Signed)
   Vital Signs MEWS/VS Documentation      06/18/2019 0700 06/18/2019 0735 06/18/2019 1200 06/18/2019 1537   MEWS Score:  2  2  2  2    MEWS Score Color:  Yellow  Yellow  Yellow  Yellow   Resp:  -  -  (!) 24  -   Pulse:  -  -  74  84   BP:  -  -  (!) 163/60  (!) 149/108   Temp:  -  -  98.8 F (37.1 C)  -   Level of Consciousness:  -  Responds to Voice  -  -      No acute change in patients condition.  IV metoprolol given for blood pressure.  Will continue to monitor     Hinton Dyer  Marland Reine 06/18/2019,3:40 PM

## 2019-06-19 ENCOUNTER — Telehealth: Payer: Self-pay | Admitting: *Deleted

## 2019-06-19 ENCOUNTER — Telehealth: Payer: Self-pay | Admitting: Oncology

## 2019-06-19 ENCOUNTER — Inpatient Hospital Stay (HOSPITAL_COMMUNITY): Payer: Medicare Other

## 2019-06-19 ENCOUNTER — Encounter (HOSPITAL_COMMUNITY): Payer: Self-pay | Admitting: General Practice

## 2019-06-19 DIAGNOSIS — Z66 Do not resuscitate: Secondary | ICD-10-CM

## 2019-06-19 LAB — RENAL FUNCTION PANEL
Albumin: 2.5 g/dL — ABNORMAL LOW (ref 3.5–5.0)
Anion gap: 14 (ref 5–15)
BUN: 27 mg/dL — ABNORMAL HIGH (ref 8–23)
CO2: 18 mmol/L — ABNORMAL LOW (ref 22–32)
Calcium: 9.2 mg/dL (ref 8.9–10.3)
Chloride: 108 mmol/L (ref 98–111)
Creatinine, Ser: 1.29 mg/dL — ABNORMAL HIGH (ref 0.44–1.00)
GFR calc Af Amer: 39 mL/min — ABNORMAL LOW (ref >60)
GFR calc non Af Amer: 34 mL/min — ABNORMAL LOW (ref >60)
Glucose, Bld: 129 mg/dL — ABNORMAL HIGH (ref 70–99)
Phosphorus: 3.9 mg/dL (ref 2.5–4.6)
Potassium: 2.3 mmol/L — CL (ref 3.5–5.1)
Sodium: 140 mmol/L (ref 135–145)

## 2019-06-19 LAB — URINALYSIS, ROUTINE W REFLEX MICROSCOPIC
Bilirubin Urine: NEGATIVE
Glucose, UA: NEGATIVE mg/dL
Ketones, ur: NEGATIVE mg/dL
Nitrite: NEGATIVE
Protein, ur: 100 mg/dL — AB
Specific Gravity, Urine: 1.016 (ref 1.005–1.030)
pH: 5 (ref 5.0–8.0)

## 2019-06-19 LAB — TYPE AND SCREEN
ABO/RH(D): O POS
Antibody Screen: POSITIVE
DAT, IgG: POSITIVE

## 2019-06-19 LAB — CBC WITH DIFFERENTIAL/PLATELET
Abs Immature Granulocytes: 0.79 10*3/uL — ABNORMAL HIGH (ref 0.00–0.07)
Basophils Absolute: 0 10*3/uL (ref 0.0–0.1)
Basophils Relative: 0 %
Eosinophils Absolute: 0.1 10*3/uL (ref 0.0–0.5)
Eosinophils Relative: 1 %
HCT: 20.2 % — ABNORMAL LOW (ref 36.0–46.0)
Hemoglobin: 6.4 g/dL — CL (ref 12.0–15.0)
Immature Granulocytes: 10 %
Lymphocytes Relative: 8 %
Lymphs Abs: 0.6 10*3/uL — ABNORMAL LOW (ref 0.7–4.0)
MCH: 29.6 pg (ref 26.0–34.0)
MCHC: 31.7 g/dL (ref 30.0–36.0)
MCV: 93.5 fL (ref 80.0–100.0)
Monocytes Absolute: 0.4 10*3/uL (ref 0.1–1.0)
Monocytes Relative: 5 %
Neutro Abs: 5.9 10*3/uL (ref 1.7–7.7)
Neutrophils Relative %: 76 %
Platelets: 70 10*3/uL — ABNORMAL LOW (ref 150–400)
RBC: 2.16 MIL/uL — ABNORMAL LOW (ref 3.87–5.11)
RDW: 19.2 % — ABNORMAL HIGH (ref 11.5–15.5)
WBC Morphology: INCREASED
WBC: 7.8 10*3/uL (ref 4.0–10.5)
nRBC: 5.4 % — ABNORMAL HIGH (ref 0.0–0.2)

## 2019-06-19 LAB — TSH: TSH: 5.063 u[IU]/mL — ABNORMAL HIGH (ref 0.350–4.500)

## 2019-06-19 LAB — PATHOLOGIST SMEAR REVIEW: Path Review: 8312020

## 2019-06-19 LAB — MAGNESIUM: Magnesium: 1.4 mg/dL — ABNORMAL LOW (ref 1.7–2.4)

## 2019-06-19 LAB — PROCALCITONIN: Procalcitonin: 0.48 ng/mL

## 2019-06-19 MED ORDER — LORAZEPAM 1 MG PO TABS: 1.0000 mg | ORAL_TABLET | Freq: Four times a day (QID) | ORAL | Status: DC | PRN

## 2019-06-19 MED ORDER — MORPHINE SULFATE (CONCENTRATE) 10 MG/0.5ML PO SOLN
5.0000 mg | ORAL | Status: DC | PRN
  Administered 2019-06-20 (×2): 5 mg via SUBLINGUAL
  Filled 2019-06-19 (×2): qty 0.5

## 2019-06-19 MED ORDER — POTASSIUM CHLORIDE 10 MEQ/100ML IV SOLN
10.0000 meq | INTRAVENOUS | Status: DC
  Filled 2019-06-19 (×4): qty 100

## 2019-06-19 MED ORDER — MAGNESIUM SULFATE 2 GM/50ML IV SOLN
2.0000 g | Freq: Once | INTRAVENOUS | Status: AC
  Administered 2019-06-19: 13:00:00 2 g via INTRAVENOUS
  Filled 2019-06-19: qty 50

## 2019-06-19 MED ORDER — POTASSIUM CHLORIDE 10 MEQ/100ML IV SOLN
10.0000 meq | INTRAVENOUS | Status: DC
  Administered 2019-06-19 (×2): 10 meq via INTRAVENOUS

## 2019-06-19 NOTE — Progress Notes (Signed)
Called palliative line and paged [facility] with palliative to talk with daughter, who is now at bedside.

## 2019-06-19 NOTE — Care Management Important Message (Signed)
Important Message  Patient Details  Name: Wendy [name] MRN: [id number] Date of Birth: 30-Mar-1917   Medicare Important Message Given:  Yes     Danise Dehne 06/19/2019, 1:54 PM

## 2019-06-19 NOTE — Progress Notes (Signed)
PT Cancellation Note  Patient Details Name: Wendy [name] MRN: [id number] DOB: 09-21-1917   Cancelled Treatment:    Reason Eval/Treat Not Completed: Medical issues which prohibited therapy.  Pt is being recommended for Hospice care and currently unresponsive including to needle stick.  Will reassess appropriateness of PT intervening at next visit.   Ramond Dial 06/19/2019, 10:03 AM   Mee Hives, PT MS Acute Rehab Dept. Number: [place] and [place]

## 2019-06-19 NOTE — Progress Notes (Signed)
Patient's temperature 96.8 @ 0747. Applied another warm blanket and bare hugger.  Rechecked temperature at 0836 up to 97.4, Dr. Marthenia Rolling paged to notify of MEWS score change. Will continue to monitor q2h patient.and vitals.

## 2019-06-19 NOTE — Telephone Encounter (Signed)
Called [name] and discussed that Dr. Sondra Come is not recommending any further treatment since Wendy [name] is unresponsive and that he is recommending residential hospice care.  [name] asked if [name] is recommending hospice as well.  Talked to Dr. Grier Mitts nurse, Lovey Newcomer, RN and she will check with [name] and call [name] back.

## 2019-06-19 NOTE — Progress Notes (Signed)
PROGRESS NOTE  [name] [id number] DOB: [birth year] DOA: 06/13/2019 PCP: Lajean Manes, MD  Brief History   Wendy [name] is a 85 y.o. female who continues to refuse oral intake.  Will need continues IVF and K replacement due to poor PO intake.  Patient also needs a palliative care consult for [place].  Mouth care.  PT Eval in place. Palliative care has spoken with the patient's daughter who wishes to discuss the patient with her family. She plans to get back in touch with palliative care on Monday regarding goals of care for her mother.  The patient is has toxic metabolic encephalopathy likely due to some combination of her advanced age, her hypertensive urgency and her dehydration.   06/18/2019: Patient seen.  No new changes.  Last chemistry was done on 06/17/2019.  Will change IV fluids to D5 water at the rate of 125 cc/h for the next 18 hours.  Renal panel and CBC in the morning.  Further management will depend on goal of care.  Guarded prognosis.  06/19/2019: After extensive discussion amongst palliative care team and patient's children, comfort directed care has been elected.  Palliative care and PT is highly appreciated.  Further management will be as directed by the palliative care team.  Consultants  . Palliative care  Procedures  . None  Antibiotics   Anti-infectives (From admission, onward)   None      Subjective  Patient is more interactive today but still unable to give any significant history  Objective   Vitals:  Vitals:   06/19/19 1036 06/19/19 1236  BP: (!) 155/71 137/81  Pulse:  86  Resp: (!) 29 (!) 29  Temp: 97.8 F (36.6 C) 97.6 F (36.4 C)  SpO2:  93%    Exam:  Constitutional:  . Patient is more interactive today.   Respiratory:  . Clear to auscultation. Cardiovascular:  . S1-S2. Abdomen:  . Abdomen is soft, non-tender, non-distended Musculoskeletal:  . No cyanosis, clubbing, or edema Neurologic:  . Pt is more interactive today.   Will not comply with full neurological exam.  I have personally reviewed the following:   Today's Data  . BMP, CBC .  Scheduled Meds:  Continuous Infusions:   Principal Problem:   Acute encephalopathy Active Problems:   Hypothyroidism   Uterine carcinoma (HCC)   Generalized weakness   Thrombocytopenia (HCC)   Normochromic normocytic anemia   Hypertensive urgency   Hypokalemia   Adult failure to thrive   Palliative care by specialist   DNR (do not resuscitate)   LOS: 4 days   A & P  Acute encephalopathy : Multifactorial.  Due to poor uncontrolled hypertension and dehydration due to poor PO intake on top of recurrent endometrial cancer with progressive anemia in a woman of advanced age. Clinically undetermined which factor is the most important causative factor. 06/18/2019: Continue to address goals of care.  Guarded prognosis.  Change IV fluids to D5 water at the rate of 125 cc an hour. 06/19/2019: Pursue comfort directed care.  Palliative care team is directing.  Failure to thrive: For comfort directed care.  Hypertensive urgency: For comfort directed care.  Uterine carcinoma: Recurrent with ongoing vaginal bleeding and resultant chronic anemia.  For comfort directed care.  Thrombocytopenia: Noted. Etiology unknown.  Hypokalemia:  Patient is now comfort measures.     DVT prophylaxis: SCD's Code Status: Partial Family Communication: Palliative care has discussed with patient's children Disposition Plan: Now comfort directed care.  Dana Allan MD  [facility] Direct contact: see [email or web address]  7PM-7AM contact night coverage as above

## 2019-06-19 NOTE — Progress Notes (Signed)
CSW received consult regarding residential hospice placement. CSW spoke with patient's daughter, Wendy [name]. She reports that their preference is [facility] in [place]. CSW sent them referral for review and availability.   Wendy Locus Randall Rampersad LCSW [phone]

## 2019-06-19 NOTE — Progress Notes (Signed)
Spoke to palliative care representative who will be reaching back out to family to make aware of patient's condition.  Will await word back from palliative after they consult with family for further instruction.

## 2019-06-19 NOTE — Telephone Encounter (Signed)
Daughter had requested call from Dr. Grier Mitts office. Contacted Ms. Ross Marcus (daughter) to inform her that [name] has stated he is not recommending any further treatment for patient. He recommends keeping patient comfortable and providing Hospice care. Ms. Ross Marcus verbalized understanding, saying that this what she has also heard from  Dr. Clabe Seal office.

## 2019-06-19 NOTE — Progress Notes (Signed)
Patient ID: Wendy [name], female   DOB: 17-Apr-1917, 85 y.o.   MRN: [id number]  This NP visited patient at the bedside as a follow up for palliative medicine needs and emotional support and to meet with family at bedside as scheduled.  Continued conversation regarding diagnosis, prognoses, GOCs, EOL wishes, dispostion and options with daughter/Wendy [name] in person and son/Wendy [name] by telephone.  We again discussed the concepts of adult failure to thrive and the limitations of medical interventions to prolong life when a body fails.  I discussed with her family that the patient is transitioning at end of life and that her time was limited.  We discussed the fact that the patient does have a documented living will stating her wishes, asking that relief of suffering and personal beliefs be considered in the event of an incurable or irreversible condition.    Discussed with family that prognosis is likely hours to days and prepare the daughter that anything could happen at any time.  Both her son and daughter understand the limited prognosis and believe that focusing on comfort, quality and dignity is the priority at this time.  Plan of Care; -DNR/DNI -No artificial feeding now or in the future -No further life prolonging interventions; no further labs, diagnostics -Symptom management to provide comfort -Transition to residential hospice for end-of-life care -Prognosis is likely hours to days  Discussed the natural trajectory and expectations at end of life.   Patient's daughter is having a difficult time anticipating the death of her mother.  Emotional support offered and educated her on available resources at the area hospices for grief and bereavement.  Questions and concerns addressed   Discussed with Dr Marthenia Rolling and bedside RN/Wendy [name]  Total time spent on the unit was 45  minutes  Greater than 50% of the time was spent in counseling and coordination of care  Wendy Lessen NP   Palliative Medicine Team Team Phone # [phone] Pager [phone]

## 2019-06-19 NOTE — Progress Notes (Signed)
CRITICAL VALUE ALERT  Critical Value:  Hgb 6.4   Date & Time Notied:  06/19/19 1051  Provider Notified: Marthenia Rolling  Orders Received/Actions taken:   CRITICAL VALUE ALERT  Critical Value: K 2.3  Date & Time Notified: 06/19/19 1059  Provider Notified: Marthenia Rolling  Orders Received/Actions take: Kcl ordered 4 runs

## 2019-06-20 ENCOUNTER — Ambulatory Visit: Payer: Medicare Other | Attending: Radiation Oncology

## 2019-06-20 ENCOUNTER — Ambulatory Visit: Payer: Medicare Other | Admitting: Radiation Oncology

## 2019-06-20 DIAGNOSIS — R0609 Other forms of dyspnea: Secondary | ICD-10-CM

## 2019-06-20 LAB — URINE CULTURE

## 2019-06-20 NOTE — Progress Notes (Signed)
Nutrition Brief Note  Chart reviewed. Pt now transitioning to comfort care.  No further nutrition interventions warranted at this time.  Please re-consult as needed.   Shonn Farruggia A. Khole Arterburn, RD, LDN, CDCES Registered Dietitian II Certified Diabetes Care and Education Specialist Pager: [phone] After hours Pager: [phone]  

## 2019-06-20 NOTE — Progress Notes (Signed)
[facility]; [facility] is in agreement with comfort care. They are electing to have pt moved to [facility] in [facility]. Daughter Hilda Blades will sign paperwork at 1030am today and SW will arrange for ambulance transport if MD at hospital in agreement for pt to discharge today.   Please call with any concerns [phone]

## 2019-06-20 NOTE — Plan of Care (Signed)
Care plan needs' met for discharge

## 2019-06-20 NOTE — Discharge Summary (Signed)
Physician Discharge Summary  Wendy Wendy [name] [id number] DOB: [birth year] DOA: 06/13/2019  PCP: Wendy Manes, MD  Admit date: 06/13/2019 Discharge date: 06/20/2019  Recommendations for Outpatient Follow-up:  1. Discharge to hospice.  Discharge Diagnoses: Principal diagnosis is #1 1. Failure to thrive 2. Advanced dementia 3. Anorexia 4. Recurrent uterine cancer 5. Thrombocytopenia  Discharge Condition: Poor Disposition: Hospice  Diet recommendation: As tolerated.  Filed Weights   06/15/19 0423 06/17/19 0344 06/19/19 0625  Weight: 55.3 kg 56.7 kg 46.3 kg    History of present illness:   Wendy Wendy [name] is a 85 y.o. female with history of recurrent endometrial carcinoma as received radiation therapy with ongoing vaginal bleeding, hypertension, CAD, hypothyroidism was brought to Wendy Wendy [name] because of increasing weakness and confusion.  Per Wendy [name]'s daughter Wendy [name] usually is ambulatory and alert awake but over Wendy last 1 week Wendy [name] has been gradually declining with poor oral intake increasing confusion and less ambulatory.  Wendy [name] has to be carried out of her room to be brought to Wendy Wendy [name].  Has not had any nausea vomiting or diarrhea.  Wendy [name] has been ongoing vaginal bleed from endometrial carcinoma and Dr. Merrilyn Puma was planning brachytherapy.  ED Course: In Wendy Wendy [name] Wendy [name] is oriented to her name moves all extremity appears weak.  CT head is unremarkable.  Lab work show hemoglobin of 7.7 which is gradually decline over Wendy last 3 to 4 months.  Sodium was 149 creatinine 1.1.  EKG shows normal sinus rhythm CT head unremarkable.  Chest x-ray does not show any acute.  COVID-19 test is pending.  Wendy [name] was started on IV fluids and admitted for generalized weakness acute encephalopathy likely from dehydration and progressive anemia.  Hospital Course:  Wendy Spaude Carltonis a 85 y.o.femalewho continues to refuse oral intake. Will need continues IVF and K replacement due to poor PO  intake. Wendy [name] also needs a palliative care consult for Wendy Wendy [name]. Mouth care. PT Eval in place. Palliative care has spoken with Wendy Wendy [name]'s daughter who wishes to discuss Wendy Wendy [name] with her family. She plans to get back in touch with palliative care on Monday regarding goals of care for her mother.  Wendy Wendy [name] is has toxic metabolic encephalopathy likely due to some combination of her advanced age, her hypertensive urgency and her dehydration. However, in talking with Wendy Wendy [name]'s daughter, I learned that Wendy Wendy [name] moved in to live with her daughter when her neglecting to turn off Wendy stove, or getting lost when driving was Wendy most important reason. I now believe that Wendy Wendy [name] likely has advanced dementia, and that her not eating is simply a sign of Wendy progression of that process.  Today's assessment: S: Wendy Wendy [name] is lying quietly. No new complaints. O: Vitals:  Vitals:   06/19/19 1236 06/20/19 1018  BP: 137/81 (!) 131/58  Pulse: 86 (!) 106  Resp: (!) 29   Temp: 97.6 F (36.4 C) 97.6 F (36.4 C)  SpO2: 93% 90%   Constitutional:   Wendy Wendy [name] is elderly, weak, and frail. She is non-verbal with me. No acute distress. Respiratory:   No increased work of breathing.  No wheezes, rales, or rhonchi.  No tactile fremitus. Cardiovascular:   Regular rate and rhythm  No murmurs, ectopy, or gallups.  No lateral PMI. No thrills. Abdomen:   Abdomen is soft, non-tender, non-distended  No hernias, masses, or organomegaly  Normoactive bowel sounds. Musculoskeletal:   No cyanosis, clubbing, or edema Skin:   No rashes, lesions, ulcers  palpation of skin:  no induration or nodules Neurologic:   Pt is unable to participate with exam. Psychiatric:   Pt is non-verbal. Unable to participate with exam.   Discharge Instructions  Discharge Instructions    Activity as tolerated - No restrictions   Complete by: As directed    Call MD for:  severe uncontrolled pain    Complete by: As directed    Diet general   Complete by: As directed    Pleasure feeds   Discharge instructions   Complete by: As directed    Comfort care only.     Allergies as of 06/20/2019   No Known Allergies     Medication List    STOP taking these medications   aspirin EC 81 MG tablet   citalopram 10 MG tablet Commonly known as: CELEXA   losartan 50 MG tablet Commonly known as: COZAAR   megestrol 40 MG tablet Commonly known as: MEGACE   Synthroid 75 MCG tablet Generic drug: levothyroxine   triamcinolone 0.025 % cream Commonly known as: KENALOG   Vitamin D (Ergocalciferol) 1.25 MG (50000 UT) Caps capsule Commonly known as: DRISDOL     TAKE these medications   Ensure Take 237 mLs by mouth 2 (two) times daily between meals.      No Known Allergies  Wendy results of significant diagnostics from this hospitalization (including imaging, microbiology, ancillary and laboratory) are listed below for reference.    Significant Diagnostic Studies: Dg Abd 1 View  Result Date: 06/14/2019 CLINICAL DATA:  Decreased oral intake over Wendy past week in a Wendy [name] with a history of endometrial carcinoma. EXAM: ABDOMEN - 1 VIEW COMPARISON:  CT chest, abdomen and pelvis 12/26/2018. FINDINGS: Wendy bowel gas pattern is normal. No radio-opaque calculi or other significant radiographic abnormality are seen. IMPRESSION: Negative exam. Electronically Signed   By: Inge Rise M.D.   On: 06/14/2019 15:19   Ct Head Wo Contrast  Result Date: 06/13/2019 CLINICAL DATA:  Altered level of consciousness, unexplained altered mental status and generalized weakness, nonverbal for 2 days EXAM: CT HEAD WITHOUT CONTRAST TECHNIQUE: Contiguous axial images were obtained from Wendy base of Wendy skull through Wendy vertex without intravenous contrast. COMPARISON:  CT June 06, 2019 FINDINGS: Brain: Redemonstration of gliosis in Wendy right cerebellar hemisphere as well as a remote lacune in Wendy right corona  radiata additional lacune is present in Wendy right thalamus, likely remote but better seen on today's examination. No evidence of acute infarction, hemorrhage, hydrocephalus, extra-axial collection or mass lesion/mass effect. Symmetric prominence of Wendy ventricles, cisterns and sulci compatible with parenchymal volume loss. Patchy areas of white matter hypoattenuation are most compatible with chronic microvascular angiopathy. Vascular: Atherosclerotic calcification of Wendy carotid siphons and intradural vertebral arteries. Skull: No calvarial fracture or suspicious osseous lesion. No scalp swelling or hematoma. Sinuses/Orbits: Paranasal sinuses and mastoid air cells are predominantly clear. Included orbital structures are unremarkable. Other: None. IMPRESSION: Remote infarcts in Wendy right cerebellum, right corona radiata, likely with remote lacune in Wendy right thalamus No acute intracranial abnormality. If persisting clinical concern for acute infarction, MRI is more sensitive and specific. Chronic microvascular changes, volume loss and atherosclerosis. Electronically Signed   By: Lovena Le M.D.   On: 06/13/2019 17:00   Ct Head Wo Contrast  Result Date: 06/07/2019 CLINICAL DATA:  Fatigue and malaise, generalized weakness EXAM: CT HEAD WITHOUT CONTRAST TECHNIQUE: Contiguous axial images were obtained from Wendy base of Wendy skull through Wendy vertex without intravenous contrast. COMPARISON:  CT 09/04/2017 FINDINGS: Brain:  No evidence of acute infarction, hemorrhage, hydrocephalus, extra-axial collection or mass lesion/mass effect. Symmetric prominence of Wendy ventricles, cisterns and sulci compatible with parenchymal volume loss. Patchy areas of white matter hypoattenuation are most compatible with chronic microvascular angiopathy. Remote infarcts in Wendy right corona radiata and right cerebellum. Vascular: Atherosclerotic calcification of Wendy carotid siphons and intradural vertebral arteries. Skull: No calvarial  fracture or suspicious osseous lesion. No scalp swelling or hematoma. Sinuses/Orbits: Paranasal sinuses and mastoid air cells are predominantly clear. Orbital structures are unremarkable. Other: None. IMPRESSION: 1. No acute intracranial abnormality. 2. Generalized parenchymal volume loss and chronic microvascular ischemic changes. 3. Remote infarcts in Wendy right corona radiata and right cerebellum. Electronically Signed   By: Lovena Le M.D.   On: 06/07/2019 00:40   Mr Brain Wo Contrast  Result Date: 06/15/2019 CLINICAL DATA:  One 85 year old female with altered mental status. Recurrent endometrial carcinoma. Increasing weakness and confusion. EXAM: MRI HEAD WITHOUT CONTRAST TECHNIQUE: Multiplanar, multiecho pulse sequences of Wendy brain and surrounding structures were obtained without intravenous contrast. COMPARISON:  Brain MRI 09/27/2013. Head CT without contrast 06/13/2019. FINDINGS: Brain: Scattered chronic infarcts in both cerebellar hemispheres. Moderate chronic T2 heterogeneity in Wendy bilateral deep gray nuclei. Patchy bilateral cerebral white matter T2 and FLAIR hyperintensity. There are few scattered chronic microhemorrhages in both posterior hemispheres, greater on Wendy left. These changes have mildly progressed since 2014; progression is most apparent in Wendy cerebellum. No restricted diffusion to suggest acute infarction. No midline shift, mass effect, evidence of mass lesion, ventriculomegaly, extra-axial collection or acute intracranial hemorrhage. Cervicomedullary junction and pituitary are within normal limits. Vascular: Major intracranial vascular flow voids are preserved. Chronic generalized intracranial artery tortuosity. Skull and upper cervical spine: Heterogeneous bone marrow signal, including in Wendy lower clivus and upper cervical vertebrae (series 15, image 12), but chronic to a degree and only mildly increased since 2014. No destructive osseous lesion identified. Sinuses/Orbits:  Negative orbits.  Paranasal sinuses are clear. Other: Mastoid air cells are clear. Visible internal auditory structures appear normal. Scalp and face soft tissues appear negative. IMPRESSION: 1. No acute intracranial abnormality. Mild to moderate for age chronic ischemic changes and small vessel disease. 2. Heterogeneous bone marrow signal, although chronic to a degree. Favor sequelae of chemotherapy/red marrow reactivation rather than osseous metastatic disease. Electronically Signed   By: Genevie Ann M.D.   On: 06/15/2019 16:43   Dg Chest Port 1 View  Result Date: 06/19/2019 CLINICAL DATA:  Altered mental status, hypertension EXAM: PORTABLE CHEST 1 VIEW COMPARISON:  06/16/2019 FINDINGS: Wendy heart size and mediastinal contours are stable. Calcified, tortuous thoracic aorta. No new focal airspace consolidation. No large pleural fluid collection. No pneumothorax. IMPRESSION: No acute cardiopulmonary findings.  Stable exam. Electronically Signed   By: Davina Poke M.D.   On: 06/19/2019 12:34   Dg Chest Port 1 View  Result Date: 06/16/2019 CLINICAL DATA:  One 85 year female with shortness of breath and altered mental status. EXAM: PORTABLE CHEST 1 VIEW COMPARISON:  09/04/2017 radiographs and earlier. FINDINGS: Portable AP semi upright view at 1639 hours. Stable cardiomegaly and mediastinal contours. Calcified aortic atherosclerosis. Visualized tracheal air column is within normal limits. Improved lung volumes. Allowing for portable technique Wendy lungs are clear. Negative visible bowel gas pattern. No acute osseous abnormality identified. IMPRESSION: 1.  No acute cardiopulmonary abnormality. 2.  Aortic Atherosclerosis (ICD10-I70.0). Electronically Signed   By: Genevie Ann M.D.   On: 06/16/2019 16:53    Microbiology: Recent Results (from Wendy  past 240 hour(s))  Urine culture     Status: Abnormal   Collection Time: 06/13/19  7:25 PM   Specimen: Urine, Catheterized  Result Value Ref Range Status   Specimen  Description URINE, CATHETERIZED  Final   Special Requests   Final    NONE Performed at [facility], [address], [place], [place] [address]    Culture MULTIPLE SPECIES PRESENT, SUGGEST RECOLLECTION (A)  Final   Report Status 06/14/2019 FINAL  Final  SARS CORONAVIRUS 2 (TAT 6-12 HRS) Nasal Swab Aptima Multi Swab     Status: None   Collection Time: 06/13/19  8:52 PM   Specimen: Aptima Multi Swab; Nasal Swab  Result Value Ref Range Status   SARS Coronavirus 2 NEGATIVE NEGATIVE Final    Comment: (NOTE) SARS-CoV-2 target nucleic acids are NOT DETECTED. Wendy SARS-CoV-2 RNA is generally detectable in upper and lower respiratory specimens during Wendy acute phase of infection. Negative results do not preclude SARS-CoV-2 infection, do not rule out co-infections with other pathogens, and should not be used as Wendy sole basis for treatment or other Wendy [name] management decisions. Negative results must be combined with clinical observations, Wendy [name] history, and epidemiological information. Wendy expected result is Negative. Fact Sheet for Patients: SugarRoll.be Fact Sheet for Healthcare Providers: https://www.woods-mathews.com/ This test is not yet approved or cleared by Wendy [place] FDA and  has been authorized for detection and/or diagnosis of SARS-CoV-2 by FDA under an Emergency Use Authorization (EUA). This EUA will remain  in effect (meaning this test can be used) for Wendy duration of Wendy COVID-19 declaration under Section 56 4(b)(1) of Wendy Act, 21 U.S.C. section 360bbb-3(b)(1), unless Wendy authorization is terminated or revoked sooner. Performed at [facility], [place] [address], [place], [place] [address]   Culture, Urine     Status: Abnormal   Collection Time: 06/19/19 11:44 AM   Specimen: Urine, Clean Catch  Result Value Ref Range Status   Specimen Description URINE, CLEAN CATCH  Final   Special Requests   Final     NONE Performed at [facility], [place] [address] [place], [place] [address]    Culture MULTIPLE SPECIES PRESENT, SUGGEST RECOLLECTION (A)  Final   Report Status 06/20/2019 FINAL  Final  Culture, blood (routine x 2)     Status: None (Preliminary result)   Collection Time: 06/19/19 11:59 AM   Specimen: BLOOD  Result Value Ref Range Status   Specimen Description BLOOD RIGHT ANTECUBITAL  Final   Special Requests   Final    BOTTLES DRAWN AEROBIC ONLY Blood Culture adequate volume   Culture   Final    NO GROWTH < 24 HOURS Performed at [facility], [place] [address], [place], [place] [address]    Report Status PENDING  Incomplete  Culture, blood (routine x 2)     Status: None (Preliminary result)   Collection Time: 06/19/19 12:10 PM   Specimen: BLOOD RIGHT HAND  Result Value Ref Range Status   Specimen Description BLOOD RIGHT HAND  Final   Special Requests   Final    BOTTLES DRAWN AEROBIC ONLY Blood Culture adequate volume   Culture   Final    NO GROWTH < 24 HOURS Performed at [facility], [place] [address], [place], [place] [address]    Report Status PENDING  Incomplete     Labs: Basic Metabolic Panel: Recent Labs  Lab 06/13/19 2022  06/15/19 0222 06/16/19 [id number] 06/16/19 1658 06/17/19 [id number]  06/19/19 0959  NA  --    < > 147* 147* 146* 149* 140  K  --    < > 3.0* 3.4* 3.3* 3.7 2.3*  CL  --    < > 112* 114* 116* 119* 108  CO2  --    < > 23 18* 18* 18* 18*  GLUCOSE  --    < > 109* 100* 114* 110* 129*  BUN  --    < > 15 19 26* 31* 27*  CREATININE  --    < > 0.86 0.92 1.16* 1.11* 1.29*  CALCIUM  --    < > 10.2 10.2 10.2 10.2 9.2  MG 1.9  --   --   --   --   --  1.4*  PHOS  --   --   --   --   --   --  3.9   < > = values in this interval not displayed.   Liver Function Tests: Recent Labs  Lab 06/13/19 1620 06/14/19 0526 06/15/19 0222 06/19/19 0959  AST 65* 70* 66*  --   ALT 14 16 16   --   ALKPHOS 100 109 103  --   BILITOT 1.3* 1.5* 2.0*  --   PROT 7.2 7.2  7.2  --   ALBUMIN 3.2* 3.3* 3.1* 2.5*   No results for input(s): LIPASE, AMYLASE in Wendy last 168 hours. No results for input(s): AMMONIA in Wendy last 168 hours. CBC: Recent Labs  Lab 06/13/19 1620 06/14/19 0526 06/15/19 1309 06/16/19 0811 06/19/19 0959  WBC 6.2 6.7 6.7 8.3 7.8  NEUTROABS 5.4 5.1  --   --  5.9  HGB 7.7* 8.4* 7.5* 7.7* 6.4*  HCT 24.9* 25.9* 23.4* 24.2* 20.2*  MCV 96.1 94.2 93.6 94.5 93.5  PLT 143* 143* 102* 94* 70*   Cardiac Enzymes: No results for input(s): CKTOTAL, CKMB, CKMBINDEX, TROPONINI in Wendy last 168 hours. BNP: BNP (last 3 results) Recent Labs    06/06/19 2312  BNP 279.9*    ProBNP (last 3 results) No results for input(s): PROBNP in Wendy last 8760 hours.  CBG: No results for input(s): GLUCAP in Wendy last 168 hours.  Principal Problem:   Acute encephalopathy Active Problems:   Hypothyroidism   Uterine carcinoma (HCC)   Generalized weakness   Thrombocytopenia (HCC)   Normochromic normocytic anemia   Hypertensive urgency   Hypokalemia   Adult failure to thrive   Palliative care by specialist   DNR (do not resuscitate)   Time coordinating discharge: 38 minutes.  Signed:        Noriko Macari, DO [facility]  06/20/2019, 2:29 PM

## 2019-06-20 NOTE — Progress Notes (Addendum)
RN gave report to Teacher, music at [facility]. RN asked her if left upper arm midline should be kept in. HOP RN asked for it to stay in place and not taken out.

## 2019-06-20 NOTE — Progress Notes (Addendum)
Patient will DC to: [facility] Anticipated DC date: 06/20/19 Family notified: Daughter Transport by: Corey Harold   Per MD patient ready for DC to [facility]. RN, patient, patient's family, and facility notified of DC. Discharge Summary sent to facility. RN to call report prior to discharge [phone]). DC packet on chart. Ambulance transport requested for patient.   CSW will sign off for now as social work intervention is no longer needed. Please consult [place] again if new needs arise.  Cedric Fishman, LCSW Clinical Social Worker [phone]

## 2019-06-20 NOTE — Progress Notes (Signed)
Patient ID: Wendy [name], female   DOB: 04-16-1917, 85 y.o.   MRN: [id number]  This NP visited patient at the bedside as a follow up for palliative medicine needs and emotional support.  Focus of care is comfort and quality.  Patient is unresponsive and appears generally comfortable.  Discussed and educated nursing on utilization of PRN medications for symptom management.  Both her son and daughter understand the limited prognosis and believe that focusing on comfort, quality and dignity is the priority at this time.  Plan of Care; -DNR/DNI -No artificial feeding now or in the future -No further life prolonging interventions; no further labs, diagnostics -Symptom management to provide comfort -Transition to residential hospice for end-of-life care, patient will likely transition to [facility] residential hospice today -Prognosis is likely hours to days   Total time spent on the unit was 15   minutes  Greater than 50% of the time was spent in counseling and coordination of care  Wadie Lessen NP  Palliative Medicine Team Team Phone # [phone] Pager [phone]

## 2019-06-22 DIAGNOSIS — R0600 Dyspnea, unspecified: Secondary | ICD-10-CM

## 2019-06-24 LAB — CULTURE, BLOOD (ROUTINE X 2)
Culture: NO GROWTH
Culture: NO GROWTH
Special Requests: ADEQUATE
Special Requests: ADEQUATE

## 2019-07-03 ENCOUNTER — Ambulatory Visit: Payer: Self-pay | Admitting: Radiation Oncology

## 2019-07-04 ENCOUNTER — Ambulatory Visit: Payer: Medicare Other | Admitting: Hematology

## 2019-07-04 ENCOUNTER — Other Ambulatory Visit: Payer: Medicare Other

## 2019-07-20 DEATH — deceased

## 2020-03-26 IMAGING — MR MRI HEAD WITHOUT CONTRAST
10 of 11 series · 43 of 48 positions shown · non-contrast
Comparison: Brain MRI 09/27/2013. Head CT without contrast
06/13/2019.

CLINICAL DATA: [REDACTED] female with altered mental
status. Recurrent endometrial carcinoma. Increasing weakness and
confusion.

EXAM:
MRI HEAD WITHOUT CONTRAST
TECHNIQUE: Multiplanar, multiecho pulse sequences of the brain and surrounding
structures were obtained without intravenous contrast.

[Series 5: DWI · axial · 3.0mm · 0.88mm/px · z∈[-91,+55]mm · 10 of 100 slices shown (1 of 4)]
[im 1/100]
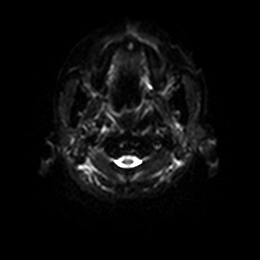
[im 12/100]
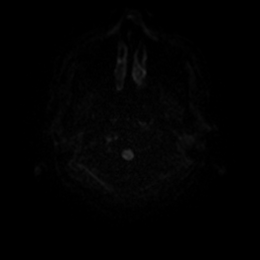
[im 23/100]
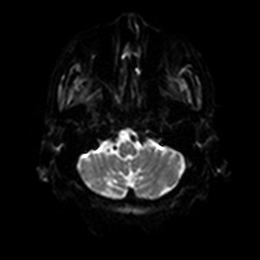
[im 34/100]
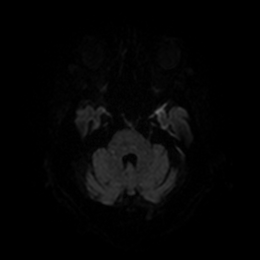
[im 45/100]
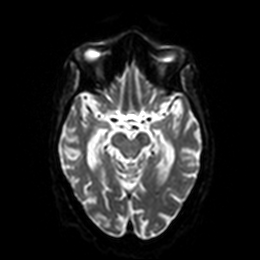
[im 56/100]
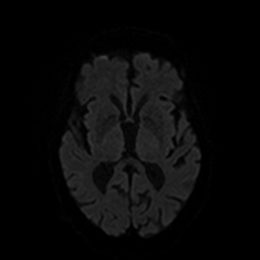
[im 67/100]
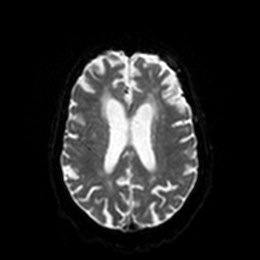
[im 78/100]
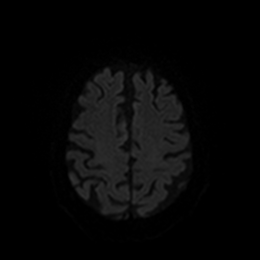
[im 89/100]
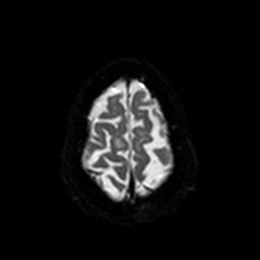
[im 100/100]
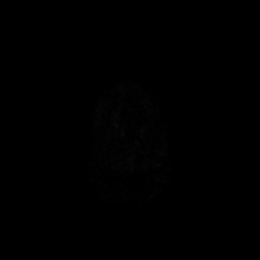

[Series 6: DWI · axial · 3.0mm · 0.88mm/px · z∈[-91,+55]mm · 5 of 50 slices shown (2 of 4)]
[im 1/50]
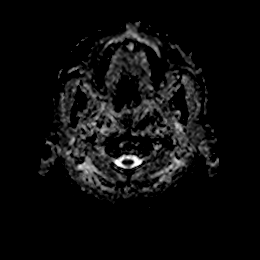
[im 13/50]
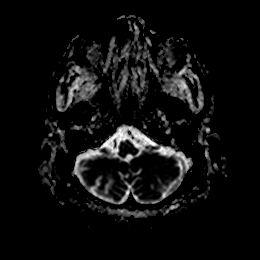
[im 25/50]
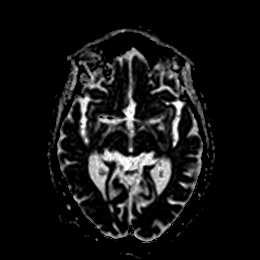
[im 37/50]
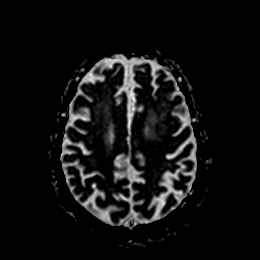
[im 50/50]
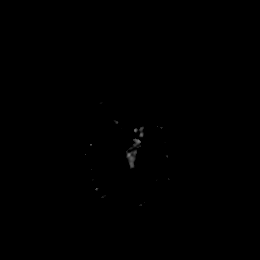

[Series 7: FLAIR · axial · 5.0mm · 0.45mm/px · z∈[-89,+54]mm · 2 of 25 slices shown]
[im 1/25]
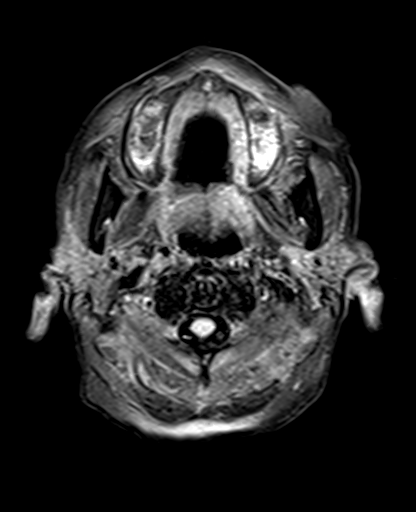
[im 25/25]
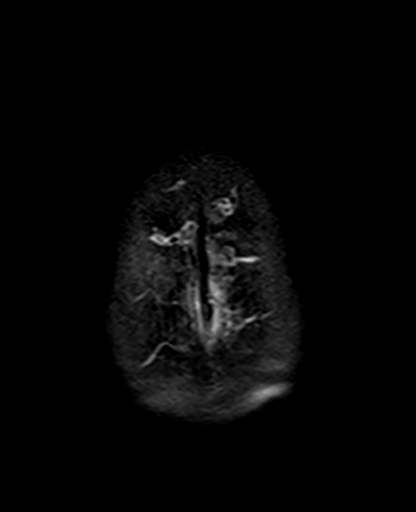

[Series 9: pha_images · axial · 3.0mm · 0.90mm/px · z∈[-86,+62]mm · 5 of 49 slices shown]
[im 1/49]
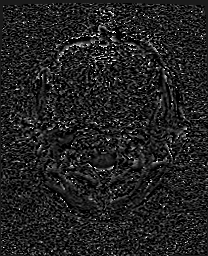
[im 13/49]
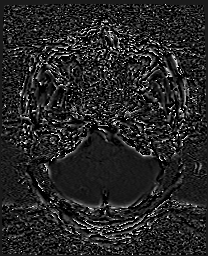
[im 25/49]
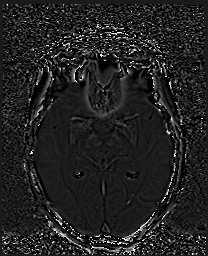
[im 37/49]
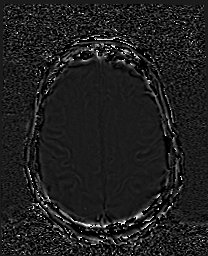
[im 49/49]
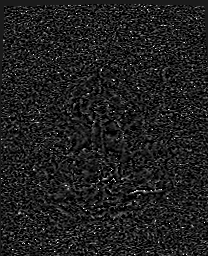

[Series 10: swi_images · axial · 3.0mm · 0.90mm/px · z∈[-89,+62]mm · 5 of 52 slices shown]
[im 1/52]
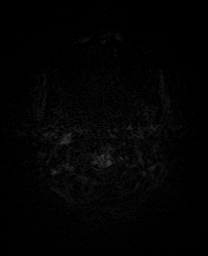
[im 13/52]
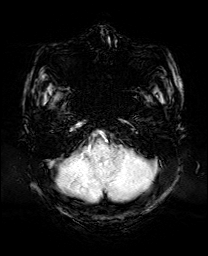
[im 26/52]
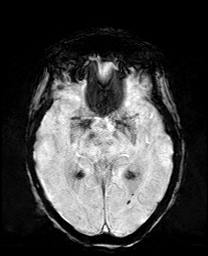
[im 39/52]
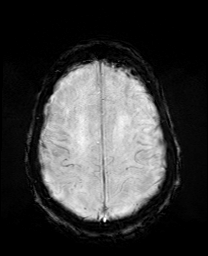
[im 52/52]
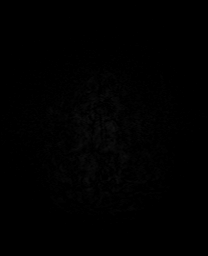

[Series 12: T2 · axial · 5.0mm · 0.72mm/px · z∈[-90,+52]mm · 2 of 25 slices shown (1 of 2)]
[im 1/25]
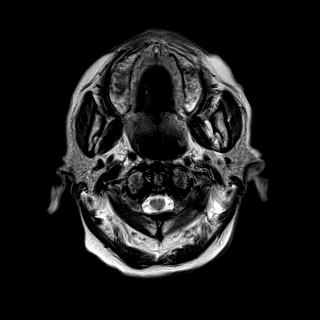
[im 25/25]
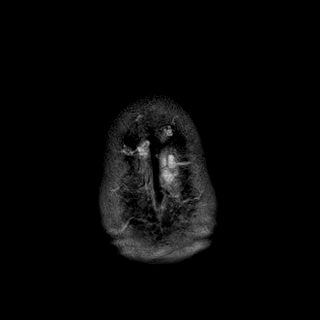

[Series 13: DWI · coronal · 4.0mm · 0.88mm/px · 6 of 64 slices shown (3 of 4)]
[im 1/64]
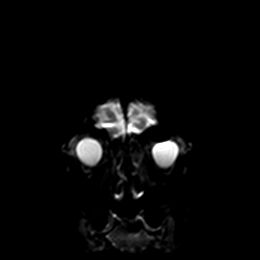
[im 13/64]
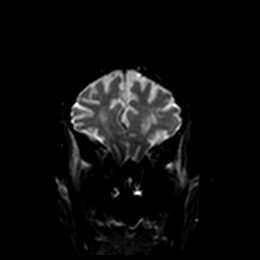
[im 26/64]
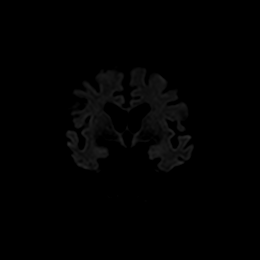
[im 38/64]
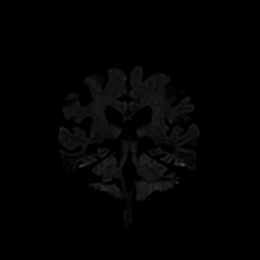
[im 51/64]
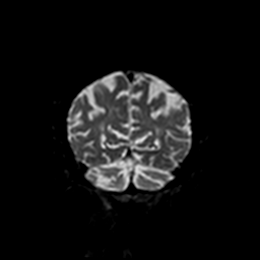
[im 64/64]
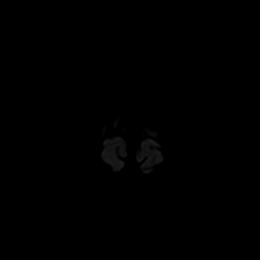

[Series 14: DWI · coronal · 4.0mm · 0.88mm/px · 3 of 32 slices shown (4 of 4)]
[im 1/32]
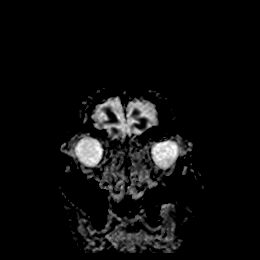
[im 16/32]
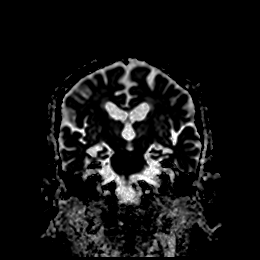
[im 32/32]
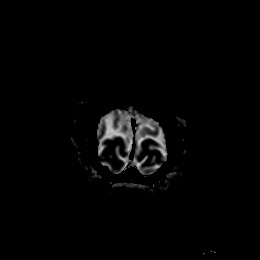

[Series 15: T1 · sagittal · 5.0mm · 0.75mm/px · 2 of 23 slices shown]
[im 1/23]
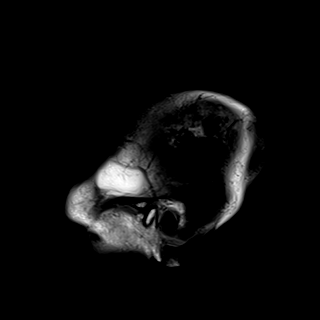
[im 23/23]
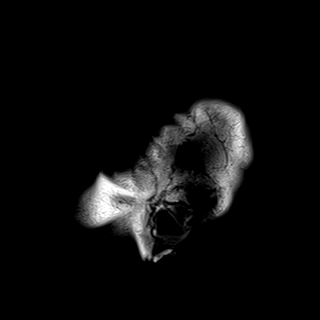

[Series 17: T2 · coronal · 5.0mm · 0.34mm/px · 3 of 29 slices shown (2 of 2)]
[im 1/29]
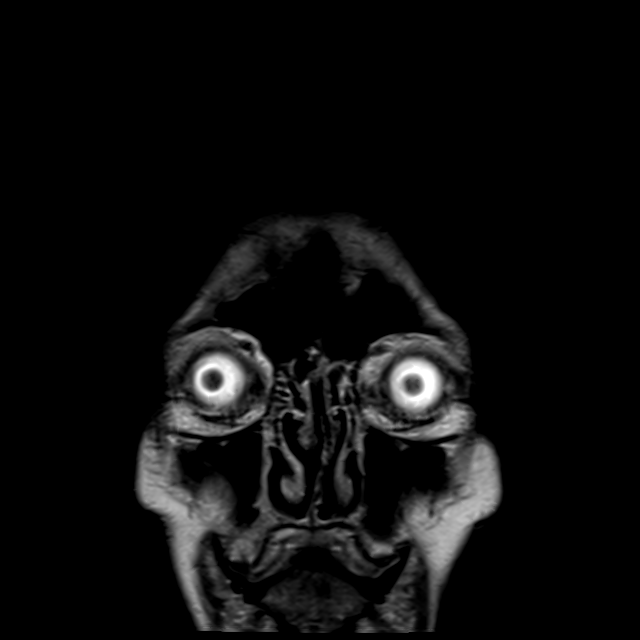
[im 15/29]
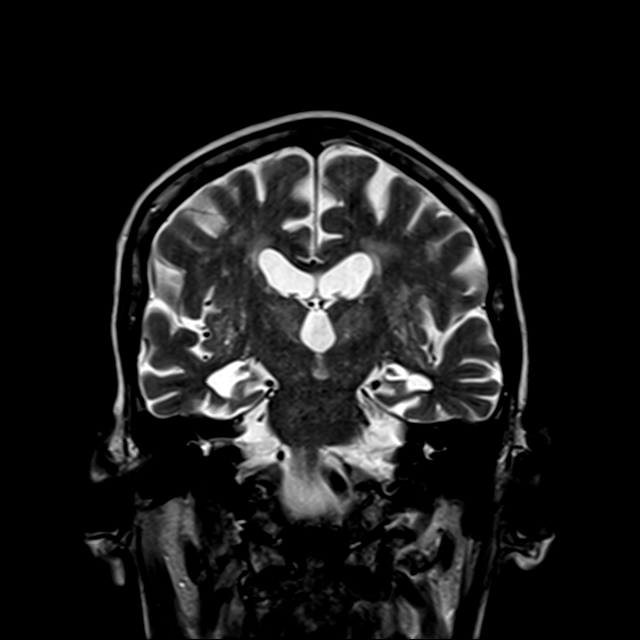
[im 29/29]
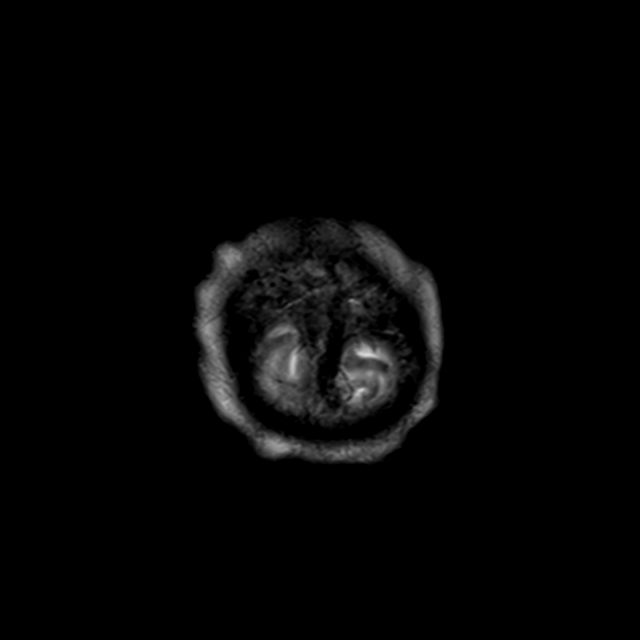

[43 of 48 positions shown; findings below may reference images not displayed]

FINDINGS: Brain: Scattered chronic infarcts in both cerebellar hemispheres.
Moderate chronic T2 heterogeneity in the bilateral deep gray nuclei.
Patchy bilateral cerebral white matter T2 and FLAIR hyperintensity.
There are few scattered chronic microhemorrhages in both posterior
hemispheres, greater on the left. These changes have mildly
progressed since 9711; progression is most apparent in the
cerebellum.

No restricted diffusion to suggest acute infarction. No midline
shift, mass effect, evidence of mass lesion, ventriculomegaly,
extra-axial collection or acute intracranial hemorrhage.
Cervicomedullary junction and pituitary are within normal limits.

Vascular: Major intracranial vascular flow voids are preserved.
Chronic generalized intracranial artery tortuosity.

Skull and upper cervical spine: Heterogeneous bone marrow signal,
including in the lower clivus and upper cervical vertebrae (series
15, image 12), but chronic to a degree and only mildly increased
since 9711. No destructive osseous lesion identified.

Sinuses/Orbits: Negative orbits.  Paranasal sinuses are clear.

Other: Mastoid air cells are clear. Visible internal auditory
structures appear normal. Scalp and face soft tissues appear
negative.
IMPRESSION: 1. No acute intracranial abnormality. Mild to moderate for age
chronic ischemic changes and small vessel disease.
2. Heterogeneous bone marrow signal, although chronic to a degree.
Favor sequelae of chemotherapy/red marrow reactivation rather than
osseous metastatic disease.

## 2020-03-27 IMAGING — DX PORTABLE CHEST - 1 VIEW
1 series · 1 of 1 positions shown · non-contrast
Comparison: 09/04/2017 radiographs and earlier.

CLINICAL DATA: [REDACTED] female with shortness of breath and
altered mental status.

EXAM:
PORTABLE CHEST 1 VIEW

[chest ap]
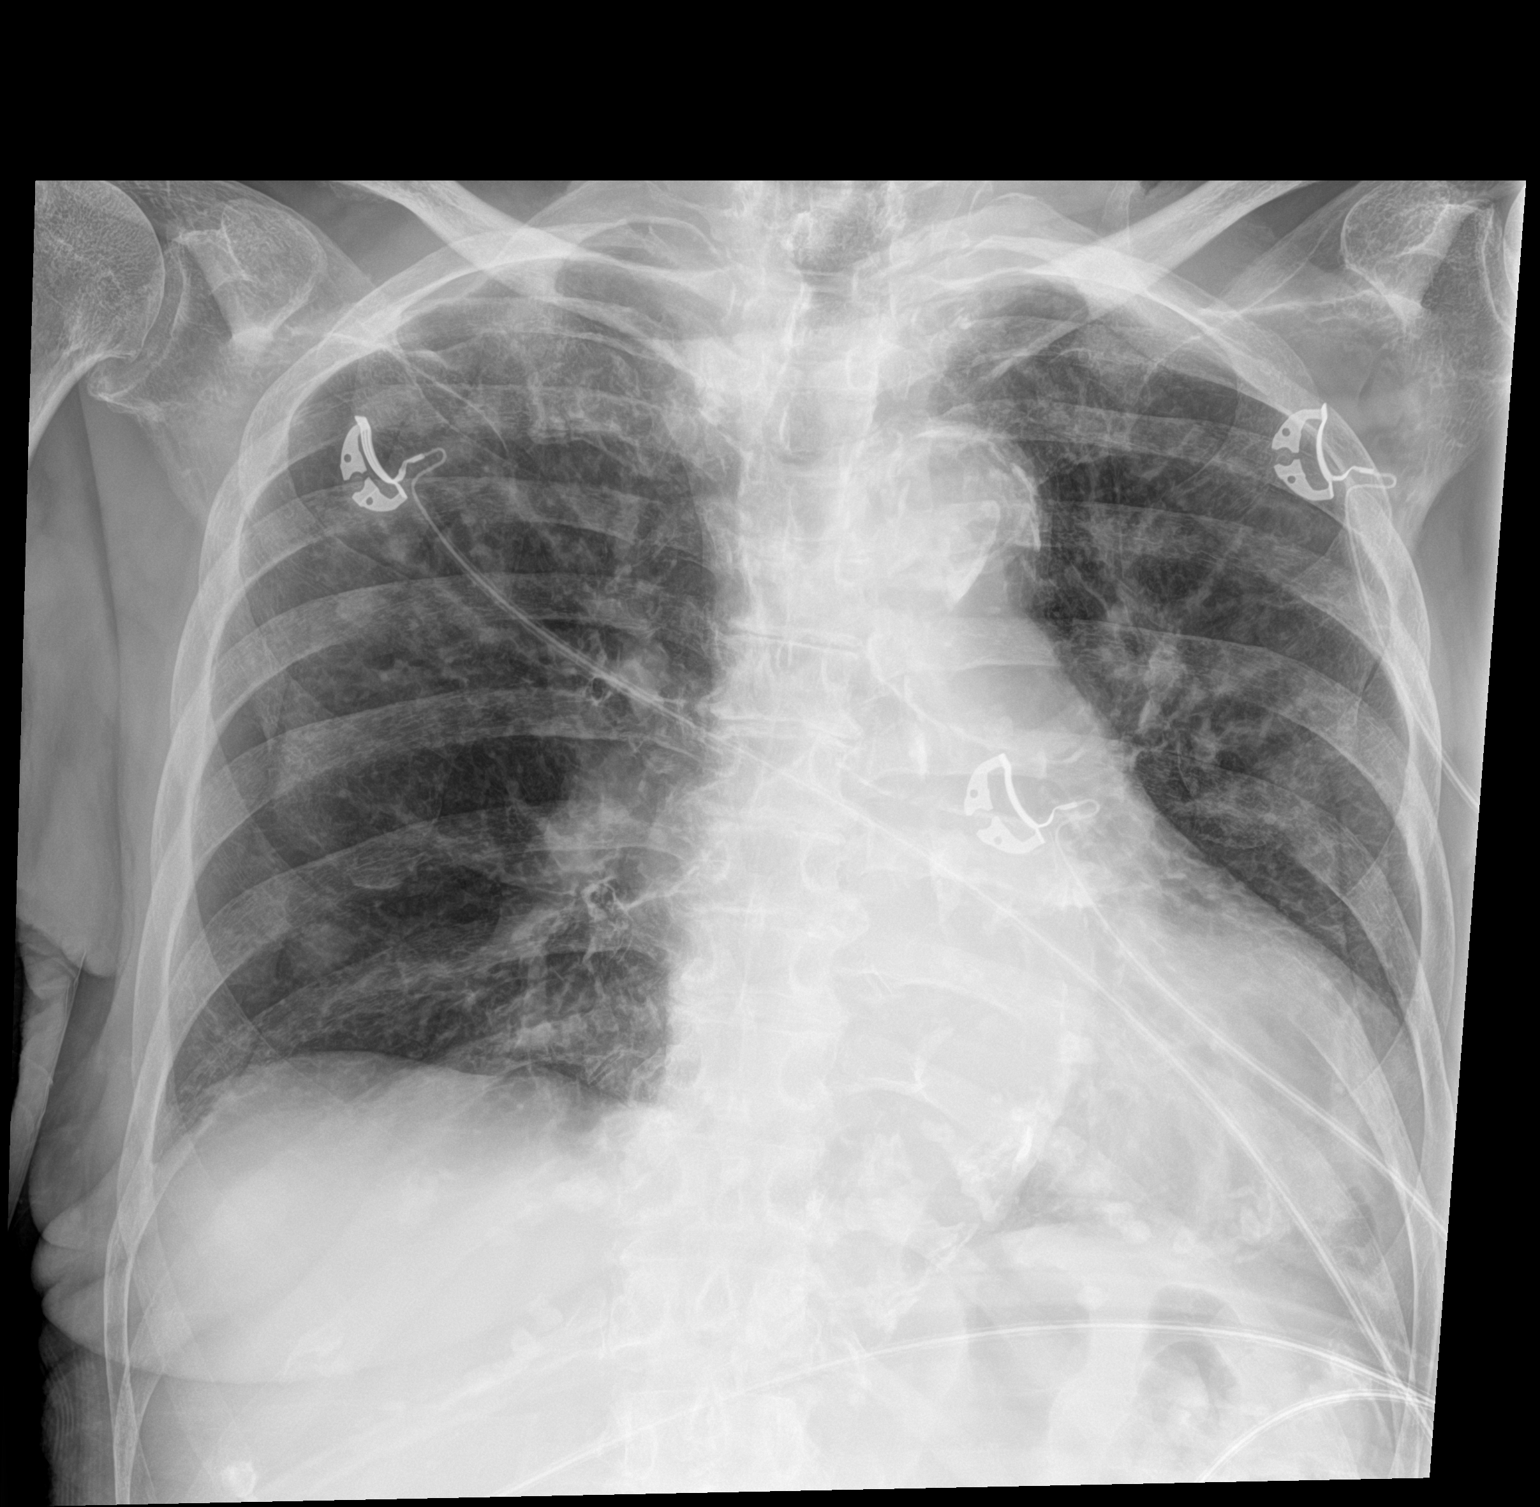

[1 of 1 positions shown; findings below may reference images not displayed]

FINDINGS: Portable AP semi upright view at 0936 hours. Stable cardiomegaly and
mediastinal contours. Calcified aortic atherosclerosis. Visualized
tracheal air column is within normal limits.

Improved lung volumes. Allowing for portable technique the lungs are
clear. Negative visible bowel gas pattern. No acute osseous
abnormality identified.
IMPRESSION: 1.  No acute cardiopulmonary abnormality.
2.  Aortic Atherosclerosis ([id number]).

## 2020-03-30 IMAGING — DX DG CHEST 1V PORT
1 series · 1 of 1 positions shown · non-contrast
Comparison: 06/16/2019

CLINICAL DATA: Altered mental status, hypertension

EXAM:
PORTABLE CHEST 1 VIEW

[chest ap]
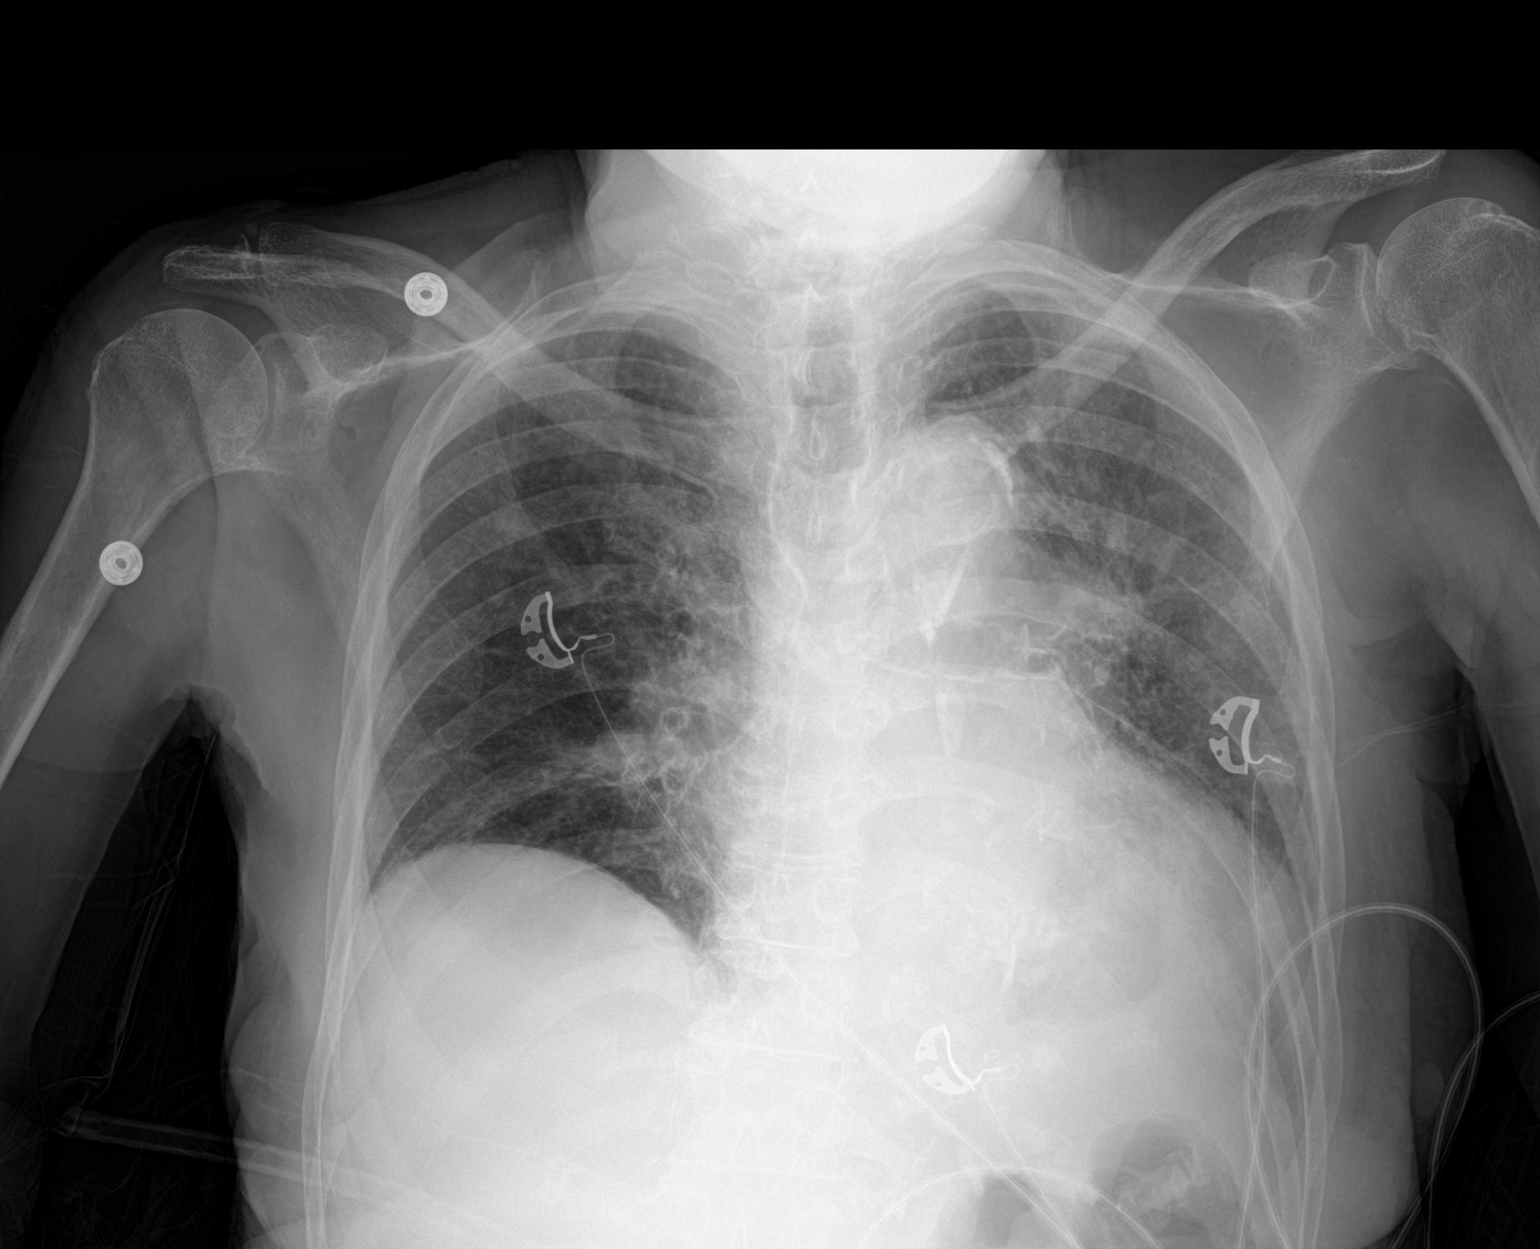

[1 of 1 positions shown; findings below may reference images not displayed]

FINDINGS: The heart size and mediastinal contours are stable. Calcified,
tortuous thoracic aorta. No new focal airspace consolidation. No
large pleural fluid collection. No pneumothorax.
IMPRESSION: No acute cardiopulmonary findings.  Stable exam.
